# Patient Record
Sex: Male | Born: 1970
Health system: Southern US, Community
[De-identification: ages and names within clinical notes are randomized; demographics above are authoritative.]

## PROBLEM LIST (undated history)

## (undated) DIAGNOSIS — I10 Essential (primary) hypertension: Secondary | ICD-10-CM

## (undated) DIAGNOSIS — M069 Rheumatoid arthritis, unspecified: Secondary | ICD-10-CM

## (undated) DIAGNOSIS — J302 Other seasonal allergic rhinitis: Secondary | ICD-10-CM

## (undated) DIAGNOSIS — G4730 Sleep apnea, unspecified: Secondary | ICD-10-CM

## (undated) HISTORY — PX: WISDOM TOOTH EXTRACTION: SHX21

---

## 2005-02-14 ENCOUNTER — Ambulatory Visit (HOSPITAL_COMMUNITY): Admission: RE | Admit: 2005-02-14 | Discharge: 2005-02-14 | Payer: Self-pay | Admitting: Family Medicine

## 2010-07-25 ENCOUNTER — Other Ambulatory Visit (HOSPITAL_COMMUNITY)
Admission: RE | Admit: 2010-07-25 | Discharge: 2010-07-25 | Disposition: A | Discharge status: Home / Self Care | Payer: BC Managed Care – PPO | Source: Ambulatory Visit | Attending: Urology | Admitting: Urology

## 2010-07-25 ENCOUNTER — Other Ambulatory Visit: Payer: Self-pay | Admitting: Urology

## 2010-07-25 DIAGNOSIS — Z9852 Vasectomy status: Secondary | ICD-10-CM | POA: Insufficient documentation

## 2011-09-18 HISTORY — PX: OTHER SURGICAL HISTORY: SHX169

## 2011-09-18 HISTORY — PX: DOPPLER ECHOCARDIOGRAPHY: SHX263

## 2012-03-04 ENCOUNTER — Encounter (HOSPITAL_BASED_OUTPATIENT_CLINIC_OR_DEPARTMENT_OTHER): Payer: Self-pay | Admitting: *Deleted

## 2012-03-04 NOTE — Progress Notes (Signed)
Pt saw cardiology this spring for work up due to family hx only-called for notes Uses a cpap-to bring dos-also to bring overnight bag and meds in case anesth needs him to stay post op

## 2012-03-06 ENCOUNTER — Ambulatory Visit (HOSPITAL_BASED_OUTPATIENT_CLINIC_OR_DEPARTMENT_OTHER): Payer: BC Managed Care – PPO | Admitting: Anesthesiology

## 2012-03-06 ENCOUNTER — Ambulatory Visit (HOSPITAL_BASED_OUTPATIENT_CLINIC_OR_DEPARTMENT_OTHER)
Admission: RE | Admit: 2012-03-06 | Discharge: 2012-03-06 | Disposition: A | Discharge status: Home / Self Care | Payer: BC Managed Care – PPO | Source: Ambulatory Visit | Attending: Orthopedic Surgery | Admitting: Orthopedic Surgery

## 2012-03-06 ENCOUNTER — Encounter (HOSPITAL_BASED_OUTPATIENT_CLINIC_OR_DEPARTMENT_OTHER)
Admission: RE | Disposition: A | Discharge status: Home / Self Care | Payer: Self-pay | Source: Ambulatory Visit | Attending: Orthopedic Surgery

## 2012-03-06 ENCOUNTER — Encounter (HOSPITAL_BASED_OUTPATIENT_CLINIC_OR_DEPARTMENT_OTHER): Payer: Self-pay | Admitting: Anesthesiology

## 2012-03-06 ENCOUNTER — Encounter (HOSPITAL_BASED_OUTPATIENT_CLINIC_OR_DEPARTMENT_OTHER): Payer: Self-pay

## 2012-03-06 DIAGNOSIS — M659 Synovitis and tenosynovitis, unspecified: Secondary | ICD-10-CM

## 2012-03-06 DIAGNOSIS — G5760 Lesion of plantar nerve, unspecified lower limb: Secondary | ICD-10-CM | POA: Insufficient documentation

## 2012-03-06 DIAGNOSIS — G4730 Sleep apnea, unspecified: Secondary | ICD-10-CM | POA: Insufficient documentation

## 2012-03-06 DIAGNOSIS — I10 Essential (primary) hypertension: Secondary | ICD-10-CM | POA: Insufficient documentation

## 2012-03-06 DIAGNOSIS — M6580 Other synovitis and tenosynovitis, unspecified site: Secondary | ICD-10-CM | POA: Insufficient documentation

## 2012-03-06 HISTORY — PX: SYNOVECTOMY: SHX5180

## 2012-03-06 HISTORY — DX: Sleep apnea, unspecified: G47.30

## 2012-03-06 HISTORY — PX: EXCISION MORTON'S NEUROMA: SHX5013

## 2012-03-06 HISTORY — DX: Essential (primary) hypertension: I10

## 2012-03-06 HISTORY — DX: Other seasonal allergic rhinitis: J30.2

## 2012-03-06 LAB — POCT I-STAT, CHEM 8
BUN: 20 mg/dL (ref 6–23)
Creatinine, Ser: 1.1 mg/dL (ref 0.50–1.35)
HCT: 40 % (ref 39.0–52.0)
Hemoglobin: 13.6 g/dL (ref 13.0–17.0)
TCO2: 22 mmol/L (ref 0–100)

## 2012-03-06 SURGERY — EXCISION, MORTON'S NEUROMA
Anesthesia: General | Site: Foot | Laterality: Right | Wound class: Clean

## 2012-03-06 MED ORDER — MEPERIDINE HCL 25 MG/ML IJ SOLN: 6.2500 mg | INTRAMUSCULAR | Status: DC | PRN

## 2012-03-06 MED ORDER — FENTANYL CITRATE 0.05 MG/ML IJ SOLN
INTRAMUSCULAR | Status: DC | PRN
  Administered 2012-03-06: 25 ug via INTRAVENOUS
  Administered 2012-03-06: 100 ug via INTRAVENOUS
  Administered 2012-03-06: 25 ug via INTRAVENOUS

## 2012-03-06 MED ORDER — LACTATED RINGERS IV SOLN
INTRAVENOUS | Status: DC
  Administered 2012-03-06 (×3): via INTRAVENOUS

## 2012-03-06 MED ORDER — LIDOCAINE HCL (CARDIAC) 20 MG/ML IV SOLN
INTRAVENOUS | Status: DC | PRN
  Administered 2012-03-06: 50 mg via INTRAVENOUS

## 2012-03-06 MED ORDER — CEFAZOLIN SODIUM-DEXTROSE 2-3 GM-% IV SOLR
2.0000 g | INTRAVENOUS | Status: AC
  Administered 2012-03-06: 2 g via INTRAVENOUS

## 2012-03-06 MED ORDER — CHLORHEXIDINE GLUCONATE 4 % EX LIQD: 60.0000 mL | Freq: Once | CUTANEOUS | Status: DC

## 2012-03-06 MED ORDER — MIDAZOLAM HCL 2 MG/2ML IJ SOLN: 0.5000 mg | Freq: Once | INTRAMUSCULAR | Status: DC | PRN

## 2012-03-06 MED ORDER — BACITRACIN ZINC 500 UNIT/GM EX OINT
TOPICAL_OINTMENT | CUTANEOUS | Status: DC | PRN
  Administered 2012-03-06: 1 via TOPICAL

## 2012-03-06 MED ORDER — BUPIVACAINE HCL (PF) 0.5 % IJ SOLN
INTRAMUSCULAR | Status: DC | PRN
  Administered 2012-03-06: 5 mL

## 2012-03-06 MED ORDER — ONDANSETRON HCL 4 MG/2ML IJ SOLN
INTRAMUSCULAR | Status: DC | PRN
  Administered 2012-03-06: 4 mg via INTRAVENOUS

## 2012-03-06 MED ORDER — SODIUM CHLORIDE 0.9 % IV SOLN: INTRAVENOUS | Status: DC

## 2012-03-06 MED ORDER — HYDROCODONE-ACETAMINOPHEN 5-325 MG PO TABS: 1.0000 | ORAL_TABLET | Freq: Four times a day (QID) | ORAL | Status: DC | PRN

## 2012-03-06 MED ORDER — PROMETHAZINE HCL 25 MG/ML IJ SOLN: 6.2500 mg | INTRAMUSCULAR | Status: DC | PRN

## 2012-03-06 MED ORDER — MIDAZOLAM HCL 5 MG/5ML IJ SOLN
INTRAMUSCULAR | Status: DC | PRN
  Administered 2012-03-06: 2 mg via INTRAVENOUS

## 2012-03-06 MED ORDER — HYDROMORPHONE HCL PF 1 MG/ML IJ SOLN
0.2500 mg | INTRAMUSCULAR | Status: DC | PRN
  Administered 2012-03-06 (×2): 0.5 mg via INTRAVENOUS

## 2012-03-06 MED ORDER — DEXAMETHASONE SODIUM PHOSPHATE 4 MG/ML IJ SOLN
INTRAMUSCULAR | Status: DC | PRN
  Administered 2012-03-06: 10 mg via INTRAVENOUS

## 2012-03-06 MED ORDER — METOCLOPRAMIDE HCL 5 MG/ML IJ SOLN
INTRAMUSCULAR | Status: DC | PRN
  Administered 2012-03-06: 10 mg via INTRAVENOUS

## 2012-03-06 MED ORDER — PROPOFOL 10 MG/ML IV BOLUS
INTRAVENOUS | Status: DC | PRN
  Administered 2012-03-06: 200 mg via INTRAVENOUS

## 2012-03-06 SURGICAL SUPPLY — 56 items
BANDAGE CONFORM 3  STR LF (GAUZE/BANDAGES/DRESSINGS) ×2 IMPLANT
BANDAGE ESMARK 6X9 LF (GAUZE/BANDAGES/DRESSINGS) IMPLANT
BLADE SURG 15 STRL LF DISP TIS (BLADE) ×2 IMPLANT
BLADE SURG 15 STRL SS (BLADE) ×4
BNDG CMPR 9X4 STRL LF SNTH (GAUZE/BANDAGES/DRESSINGS) ×1
BNDG CMPR 9X6 STRL LF SNTH (GAUZE/BANDAGES/DRESSINGS)
BNDG COHESIVE 4X5 TAN STRL (GAUZE/BANDAGES/DRESSINGS) ×2 IMPLANT
BNDG ESMARK 4X9 LF (GAUZE/BANDAGES/DRESSINGS) ×2 IMPLANT
BNDG ESMARK 6X9 LF (GAUZE/BANDAGES/DRESSINGS)
CHLORAPREP W/TINT 26ML (MISCELLANEOUS) ×2 IMPLANT
CLOTH BEACON ORANGE TIMEOUT ST (SAFETY) ×2 IMPLANT
CORDS BIPOLAR (ELECTRODE) ×2 IMPLANT
COVER TABLE BACK 60X90 (DRAPES) ×2 IMPLANT
CUFF TOURNIQUET SINGLE 18IN (TOURNIQUET CUFF) IMPLANT
DRAPE EXTREMITY T 121X128X90 (DRAPE) ×2 IMPLANT
DRAPE SURG 17X23 STRL (DRAPES) IMPLANT
DRAPE U-SHAPE 47X51 STRL (DRAPES) ×2 IMPLANT
DRSG EMULSION OIL 3X3 NADH (GAUZE/BANDAGES/DRESSINGS) ×2 IMPLANT
DRSG PAD ABDOMINAL 8X10 ST (GAUZE/BANDAGES/DRESSINGS) IMPLANT
ELECT REM PT RETURN 9FT ADLT (ELECTROSURGICAL)
ELECTRODE REM PT RTRN 9FT ADLT (ELECTROSURGICAL) IMPLANT
GLOVE BIO SURGEON STRL SZ 6.5 (GLOVE) ×2 IMPLANT
GLOVE BIO SURGEON STRL SZ8 (GLOVE) ×2 IMPLANT
GLOVE BIOGEL PI IND STRL 7.0 (GLOVE) ×1 IMPLANT
GLOVE BIOGEL PI IND STRL 8 (GLOVE) ×1 IMPLANT
GLOVE BIOGEL PI INDICATOR 7.0 (GLOVE) ×1
GLOVE BIOGEL PI INDICATOR 8 (GLOVE) ×1
GOWN PREVENTION PLUS XLARGE (GOWN DISPOSABLE) ×2 IMPLANT
GOWN PREVENTION PLUS XXLARGE (GOWN DISPOSABLE) ×2 IMPLANT
NEEDLE HYPO 25X1 1.5 SAFETY (NEEDLE) ×2 IMPLANT
NS IRRIG 1000ML POUR BTL (IV SOLUTION) ×2 IMPLANT
PACK BASIN DAY SURGERY FS (CUSTOM PROCEDURE TRAY) ×2 IMPLANT
PAD CAST 4YDX4 CTTN HI CHSV (CAST SUPPLIES) ×1 IMPLANT
PADDING CAST ABS 4INX4YD NS (CAST SUPPLIES)
PADDING CAST ABS COTTON 4X4 ST (CAST SUPPLIES) IMPLANT
PADDING CAST COTTON 4X4 STRL (CAST SUPPLIES) ×2
SHEET MEDIUM DRAPE 40X70 STRL (DRAPES) ×2 IMPLANT
SPONGE GAUZE 4X4 12PLY (GAUZE/BANDAGES/DRESSINGS) ×2 IMPLANT
SPONGE LAP 18X18 X RAY DECT (DISPOSABLE) ×2 IMPLANT
STOCKINETTE 6  STRL (DRAPES) ×1
STOCKINETTE 6 STRL (DRAPES) ×1 IMPLANT
STRIP CLOSURE SKIN 1/2X4 (GAUZE/BANDAGES/DRESSINGS) IMPLANT
SUCTION FRAZIER TIP 10 FR DISP (SUCTIONS) IMPLANT
SUT MNCRL AB 3-0 PS2 18 (SUTURE) ×2 IMPLANT
SUT PROLENE 3 0 PS 2 (SUTURE) ×2 IMPLANT
SUT VIC AB 2-0 SH 18 (SUTURE) IMPLANT
SUT VIC AB 3-0 PS1 18 (SUTURE)
SUT VIC AB 3-0 PS1 18XBRD (SUTURE) IMPLANT
SUT VICRYL 4-0 PS2 18IN ABS (SUTURE) IMPLANT
SYR BULB 3OZ (MISCELLANEOUS) ×2 IMPLANT
SYR CONTROL 10ML LL (SYRINGE) ×2 IMPLANT
TOWEL OR 17X24 6PK STRL BLUE (TOWEL DISPOSABLE) ×2 IMPLANT
TUBE CONNECTING 20X1/4 (TUBING) IMPLANT
UNDERPAD 30X30 INCONTINENT (UNDERPADS AND DIAPERS) ×2 IMPLANT
WATER STERILE IRR 1000ML POUR (IV SOLUTION) IMPLANT
YANKAUER SUCT BULB TIP NO VENT (SUCTIONS) IMPLANT

## 2012-03-06 NOTE — Anesthesia Procedure Notes (Signed)
Procedure Name: LMA Insertion Date/Time: 03/06/2012 7:42 AM Performed by: Gar Gibbon Pre-anesthesia Checklist: Patient identified, Emergency Drugs available, Suction available and Patient being monitored Patient Re-evaluated:Patient Re-evaluated prior to inductionOxygen Delivery Method: Circle System Utilized Preoxygenation: Pre-oxygenation with 100% oxygen Intubation Type: IV induction Ventilation: Mask ventilation without difficulty LMA: LMA inserted LMA Size: 4.0 Number of attempts: 1 Airway Equipment and Method: bite block Placement Confirmation: positive ETCO2 Tube secured with: Tape Dental Injury: Teeth and Oropharynx as per pre-operative assessment

## 2012-03-06 NOTE — Anesthesia Preprocedure Evaluation (Signed)
Anesthesia Evaluation  Patient identified by MRN, date of birth, ID band Patient awake    Reviewed: Allergy & Precautions, H&P , NPO status , Patient's Chart, lab work & pertinent test results  History of Anesthesia Complications Negative for: history of anesthetic complications  Airway Mallampati: II TM Distance: >3 FB Neck ROM: Full    Dental  (+) Dental Advisory Given and Teeth Intact   Pulmonary sleep apnea and Continuous Positive Airway Pressure Ventilation ,  breath sounds clear to auscultation  Pulmonary exam normal       Cardiovascular hypertension, Pt. on medications Rhythm:Regular Rate:Normal  '13 ECHO: normal LVF, normal valves '13 exercise stress test: no ischemia   Neuro/Psych negative neurological ROS     GI/Hepatic negative GI ROS, Neg liver ROS,   Endo/Other  negative endocrine ROS  Renal/GU negative Renal ROS     Musculoskeletal   Abdominal   Peds  Hematology negative hematology ROS (+)   Anesthesia Other Findings   Reproductive/Obstetrics                           Anesthesia Physical Anesthesia Plan  ASA: II  Anesthesia Plan: General   Post-op Pain Management:    Induction: Intravenous  Airway Management Planned: LMA  Additional Equipment:   Intra-op Plan:   Post-operative Plan:   Informed Consent: I have reviewed the patients History and Physical, chart, labs and discussed the procedure including the risks, benefits and alternatives for the proposed anesthesia with the patient or authorized representative who has indicated his/her understanding and acceptance.   Dental advisory given  Plan Discussed with: CRNA and Surgeon  Anesthesia Plan Comments: (Plan routine monitors, GA- LMA OK Declines popliteal nerve block, Prefers local numbing)        Anesthesia Quick Evaluation

## 2012-03-06 NOTE — Anesthesia Postprocedure Evaluation (Signed)
  Anesthesia Post-op Note  Patient: Bobby Kramer  Procedure(s) Performed: Procedure(s) (LRB) with comments: EXCISION MORTON'S NEUROMA (Right) - EXCISION OF RIGHT THIRD WEB SPACE NEUROMA  HENDER RETRACTOR *ESMARK USED AS TOURNIQUET.  ON AT 1914.  OFF AT 0826. SYNOVECTOMY (Right) - SYNOVECTOMY OF THIRD AND FOURTH METATARSAL PHALANGEAL JOINT  Patient Location: PACU  Anesthesia Type: General  Level of Consciousness: awake, alert , oriented and patient cooperative  Airway and Oxygen Therapy: Patient Spontanous Breathing  Post-op Pain: none  Post-op Assessment: Post-op Vital signs reviewed, Patient's Cardiovascular Status Stable, Respiratory Function Stable, Patent Airway, No signs of Nausea or vomiting and Pain level controlled  Post-op Vital Signs: Reviewed and stable  Complications: No apparent anesthesia complications

## 2012-03-06 NOTE — Transfer of Care (Signed)
Immediate Anesthesia Transfer of Care Note  Patient: Bobby Kramer  Procedure(s) Performed: Procedure(s) (LRB) with comments: EXCISION MORTON'S NEUROMA (Right) - EXCISION OF RIGHT THIRD WEB SPACE NEUROMA  HENDER RETRACTOR *ESMARK USED AS TOURNIQUET.  ON AT 1610.  OFF AT 0826. SYNOVECTOMY (Right) - SYNOVECTOMY OF THIRD AND FOURTH METATARSAL PHALANGEAL JOINT  Patient Location: PACU  Anesthesia Type: General  Level of Consciousness: awake and patient cooperative  Airway & Oxygen Therapy: Patient Spontanous Breathing  Post-op Assessment: Report given to PACU RN  Post vital signs: Reviewed and stable  Complications: No apparent anesthesia complications

## 2012-03-06 NOTE — Op Note (Signed)
NAME:  LUNDY, COZART                 ACCOUNT NO.:  [id number]  MEDICAL RECORD NO.:  [id number]  LOCATION:                                 FACILITY:  PHYSICIAN:  Toni Arthurs, MD             DATE OF BIRTH:  DATE OF PROCEDURE:  03/06/2012 DATE OF DISCHARGE:                              OPERATIVE REPORT   PREOPERATIVE DIAGNOSES: 1. Right third space Morton's neuroma. 2. Right third metatarsophalangeal joint synovitis. 3. Right fourth metatarsophalangeal joint synovitis.  POSTOPERATIVE DIAGNOSES: 1. Right third space Morton's neuroma. 2. Right third metatarsophalangeal joint synovitis. 3. Right fourth metatarsophalangeal joint synovitis.  PROCEDURE: 1. Excision of right third webspace Morton's neuroma. 2. Synovectomy, right third MTP joint. 3. Synovectomy, right fourth MTP joint.  SURGEON:  Toni Arthurs, MD  ANESTHESIA:  General.  ESTIMATED BLOOD LOSS:  Minimal.  TOURNIQUET TIME:  38 minutes with an ankle Esmarch.  COMPLICATIONS:  None apparent.  SPECIMENS:  Synovium right fourth MTP joint to Pathology.  DISPOSITION:  Extubated awake and stable to recovery.  INDICATIONS FOR PROCEDURE:  The patient is a 41 year old male with history of right forefoot pain now for several months.  Symptoms and signs are consistent with a Morton's neuroma as well as synovitis of the third and fourth MTP joints.  He presents now for excision of his Morton's neuroma as well as third and fourth MTP joint synovectomies. He understands the risks and benefits, the alternative treatment options, and elects surgical treatment.  He specifically understands risks of bleeding, infection, nerve damage, blood clots, need for additional surgery, amputation, and death.  PROCEDURE IN DETAIL:  After preoperative consent was obtained, the correct operative site was identified.  The patient was brought to the operating room and placed supine on the operating table.  General anesthesia was induced.   Preoperative antibiotics were administered. Surgical time-out was taken.  The right lower extremity was prepped and draped in standard sterile fashion.  Extremity was exsanguinated and tourniquet was wrapped around the ankle.  A longitudinal incision was made over the third web space.  Sharp dissection was carried down through the skin and subcutaneous tissue.  Blunt dissection was then carried down to the interspace between the third and fourth metatarsals. The intermetatarsal ligament was identified.  It was divided under direct vision in its entirety.  The interdigital nerve was identified and was noted to have a stenosed hourglass appearance with quite a bit of thickening over the distal portion.  The nerve was sharply transected proximally at the level of the interossei and allowed to retract within the muscle layer.  Distally the nerve was traced out to its bifurcation and was transected on each side of the bifurcation.  The artery was inspected and was noted to be healthy and intact.  Attention was then turned to the third MTP joint.  The interval between the extensor digitorum brevis and extensor digitorum longus was identified and incised.  The dorsal capsulotomy was made and the dorsal joint capsule was excised in its entirety.  It was noted to be quite thickened with mild amount of synovitis.  The medial, lateral, and plantar aspect  of the joint were inspected and were noted to have no significant synovitis.  Attention was then turned to the fourth MTP joint.  The interval just medial to the extensor digitorum longus tendon was identified and this was incised.  There was significant synovitis noted at the dorsum of the joint but the joint itself was stable to Lachman testing.  The synovitis dorsally was resected and this extended laterally and medially. Plantarly the joint had no synovitis.  The specimen of synovium was sent to Pathology.  The wound was irrigated copiously  including the webspace and both MTP joints.  Inverted simple sutures of 3-0 Monocryl were used to close the subcutaneous tissue and horizontal mattress sutures of 3-0 Prolene were used to close the skin incision.  Marcaine 0.5% plain was infiltrated into the subcutaneous tissue at the site of the incision. Sterile dressings were applied followed by a compression wrap.  The patient was then awakened from anesthesia and transported to the recovery room in stable condition.  The tourniquet had been released at 38 minutes after application of the dressings.  FOLLOWUP PLAN:  The patient will be weightbearing as tolerated on his right lower extremity in a hard-sole shoe.  He will follow up with me in 2 weeks for suture removal.     Toni Arthurs, MD     JH/MEDQ  D:  03/06/2012  T:  03/06/2012  Job:  [id number]

## 2012-03-06 NOTE — H&P (Signed)
Bobby Kramer is an 41 y.o. male.   Chief Complaint: right foot pain HPI: 41 y/o male with h/o right forefoot pain for many months.  He has had a favorable response to a steroid injection in the 3rd webspace, but the response wore off.  He has failed treatment with orthotics, activity modification, nsaids and steroid injection.  He presents now for neurolysis v. Neurectomy of the 3rd webspace and synovectomy of the 3rd and 4th MTP joints of the right foot.  Past Medical History  Diagnosis Date  . Hypertension   . Seasonal allergies   . Sleep apnea     uses a cpap  . Morton neuroma     Past Surgical History  Procedure Date  . Wisdom tooth extraction     History reviewed. No pertinent family history. Social History:  reports that he quit smoking about 5 years ago. He does not have any smokeless tobacco history on file. He reports that he drinks alcohol. He reports that he does not use illicit drugs.  Allergies: No Known Allergies  Medications Prior to Admission  Medication Sig Dispense Refill  . fluticasone (FLONASE) 50 MCG/ACT nasal spray Place 2 sprays into the nose daily.      Marland Kitchen losartan (COZAAR) 100 MG tablet Take 100 mg by mouth daily.        Results for orders placed during the hospital encounter of Mar 24, 2012 (from the past 48 hour(s))  POCT I-STAT, CHEM 8     Status: Abnormal   Collection Time   03-24-2012  6:59 AM      Component Value Range Comment   Sodium 140  135 - 145 mEq/L    Potassium 3.9  3.5 - 5.1 mEq/L    Chloride 107  96 - 112 mEq/L    BUN 20  6 - 23 mg/dL    Creatinine, Ser 1.61  0.50 - 1.35 mg/dL    Glucose, Bld 096 (*) 70 - 99 mg/dL    Calcium, Ion 0.45  4.09 - 1.23 mmol/L    TCO2 22  0 - 100 mmol/L    Hemoglobin 13.6  13.0 - 17.0 g/dL    HCT 81.1  91.4 - 78.2 %    No results found.  ROS  No recent f/c/n/v/wt loss.  Blood pressure 122/79, pulse 62, temperature 98 F (36.7 C), temperature source Oral, resp. rate 20, height 6\' 2"  (1.88 m), weight  104.327 kg (230 lb), SpO2 98.00%. Physical Exam  wn wd male in nad.  A and O x 4.  Mood and affect normal.  EOMI.  Respirations unlabored.  R foot with healthy and intact skin.  No lymphadenopathy.  2+ dp and pt pulses.  Feels LT normally throughout foot.  5/5 strength in PF and DF at toes.  TTP at 3rd webspace and pain with shuck testing of the 3rd and 4th MTP joints.  Assessment/Plan Right forefoot 3rd webspace morton's neuroma and 3rd and 4th MTP joint synovitis.  To OR for neurolysis v. Neurectomy of the 3rd webspace and synovectomy of the 3rd and 4th mtp joints. The risks and benefits of the alternative treatment options have been discussed in detail.  The patient wishes to proceed with surgery and specifically understands risks of bleeding, infection, nerve damage, blood clots, need for additional surgery, amputation and death.   AUSTAN, NICHOLL 03/24/2012, 7:25 AM

## 2012-03-06 NOTE — Brief Op Note (Signed)
03/06/2012  8:36 AM  PATIENT:  Bobby Kramer  41 y.o. male  PRE-OPERATIVE DIAGNOSIS:  RIGHT THIRD WEB SPACE NEUROMA AND THIRD AND FOURTH METATARSAL PHALANGEAL JOINT SYNOVITIS  POST-OPERATIVE DIAGNOSIS:  RIGHT THIRD WEB SPACE NEUROMA AND THIRD AND FOURTH METATARSAL PHALANGEAL JOINT SYNOVITIS  Procedure(s): 1.  Excision 3rd webspace morton's neuroma 2.  Synovectomy 3rd MTP joint  3.  Synovectomy 4th MTP joint  SURGEON:  Toni Arthurs, MD  ASSISTANT: n/a  ANESTHESIA:   General  EBL:  minimal   TOURNIQUET:  38 min with ankle esmarch  COMPLICATIONS:  None apparent  DISPOSITION:  Extubated, awake and stable to recovery.  DICTATION ID:  [id number]

## 2012-03-07 ENCOUNTER — Encounter (HOSPITAL_BASED_OUTPATIENT_CLINIC_OR_DEPARTMENT_OTHER): Payer: Self-pay | Admitting: Orthopedic Surgery

## 2012-05-09 HISTORY — PX: CARDIAC CATHETERIZATION: SHX172

## 2012-05-14 ENCOUNTER — Ambulatory Visit (HOSPITAL_COMMUNITY)
Admission: RE | Admit: 2012-05-14 | Discharge: 2012-05-14 | Disposition: A | Discharge status: Home / Self Care | Payer: BC Managed Care – PPO | Source: Ambulatory Visit | Attending: Cardiovascular Disease | Admitting: Cardiovascular Disease

## 2012-05-14 ENCOUNTER — Other Ambulatory Visit (HOSPITAL_COMMUNITY): Payer: Self-pay | Admitting: Cardiovascular Disease

## 2012-05-14 DIAGNOSIS — Z01811 Encounter for preprocedural respiratory examination: Secondary | ICD-10-CM

## 2012-05-14 DIAGNOSIS — Z87891 Personal history of nicotine dependence: Secondary | ICD-10-CM | POA: Insufficient documentation

## 2012-05-14 DIAGNOSIS — R079 Chest pain, unspecified: Secondary | ICD-10-CM | POA: Insufficient documentation

## 2012-05-15 ENCOUNTER — Encounter (HOSPITAL_COMMUNITY): Payer: Self-pay | Admitting: Pharmacy Technician

## 2012-05-26 ENCOUNTER — Ambulatory Visit (HOSPITAL_COMMUNITY)
Admission: RE | Admit: 2012-05-26 | Discharge: 2012-05-26 | Disposition: A | Discharge status: Home / Self Care | Payer: BC Managed Care – PPO | Source: Ambulatory Visit | Attending: Cardiovascular Disease | Admitting: Cardiovascular Disease

## 2012-05-26 ENCOUNTER — Encounter (HOSPITAL_COMMUNITY)
Admission: RE | Disposition: A | Discharge status: Home / Self Care | Payer: Self-pay | Source: Ambulatory Visit | Attending: Cardiovascular Disease

## 2012-05-26 DIAGNOSIS — R0789 Other chest pain: Secondary | ICD-10-CM | POA: Insufficient documentation

## 2012-05-26 HISTORY — PX: LEFT HEART CATHETERIZATION WITH CORONARY ANGIOGRAM: SHX5451

## 2012-05-26 SURGERY — LEFT HEART CATHETERIZATION WITH CORONARY ANGIOGRAM
Anesthesia: LOCAL | Laterality: Bilateral

## 2012-05-26 MED ORDER — ACETAMINOPHEN 325 MG PO TABS: 650.0000 mg | ORAL_TABLET | ORAL | Status: DC | PRN

## 2012-05-26 MED ORDER — SODIUM CHLORIDE 0.9 % IV SOLN: 250.0000 mL | INTRAVENOUS | Status: DC | PRN

## 2012-05-26 MED ORDER — FENTANYL CITRATE 0.05 MG/ML IJ SOLN
INTRAMUSCULAR | Status: AC
  Filled 2012-05-26: qty 2

## 2012-05-26 MED ORDER — SODIUM CHLORIDE 0.9 % IJ SOLN: 3.0000 mL | INTRAMUSCULAR | Status: DC | PRN

## 2012-05-26 MED ORDER — ASPIRIN 81 MG PO CHEW
324.0000 mg | CHEWABLE_TABLET | ORAL | Status: AC
  Administered 2012-05-26: 324 mg via ORAL

## 2012-05-26 MED ORDER — HEPARIN (PORCINE) IN NACL 2-0.9 UNIT/ML-% IJ SOLN
INTRAMUSCULAR | Status: AC
  Filled 2012-05-26: qty 1000

## 2012-05-26 MED ORDER — MIDAZOLAM HCL 2 MG/2ML IJ SOLN
INTRAMUSCULAR | Status: AC
  Filled 2012-05-26: qty 2

## 2012-05-26 MED ORDER — HEPARIN SODIUM (PORCINE) 1000 UNIT/ML IJ SOLN
INTRAMUSCULAR | Status: AC
  Filled 2012-05-26: qty 1

## 2012-05-26 MED ORDER — SODIUM CHLORIDE 0.9 % IJ SOLN: 3.0000 mL | Freq: Two times a day (BID) | INTRAMUSCULAR | Status: DC

## 2012-05-26 MED ORDER — SODIUM CHLORIDE 0.9 % IV SOLN: 1.0000 mL/kg/h | INTRAVENOUS | Status: DC

## 2012-05-26 MED ORDER — NITROGLYCERIN 0.2 MG/ML ON CALL CATH LAB
INTRAVENOUS | Status: AC
  Filled 2012-05-26: qty 1

## 2012-05-26 MED ORDER — LIDOCAINE HCL (PF) 1 % IJ SOLN
INTRAMUSCULAR | Status: AC
  Filled 2012-05-26: qty 30

## 2012-05-26 MED ORDER — VERAPAMIL HCL 2.5 MG/ML IV SOLN
INTRAVENOUS | Status: AC
  Filled 2012-05-26: qty 2

## 2012-05-26 MED ORDER — ASPIRIN 81 MG PO CHEW
CHEWABLE_TABLET | ORAL | Status: AC
  Filled 2012-05-26: qty 4

## 2012-05-26 MED ORDER — SODIUM CHLORIDE 0.9 % IV SOLN
INTRAVENOUS | Status: DC
  Administered 2012-05-26: 11:00:00 via INTRAVENOUS

## 2012-05-26 MED ORDER — ONDANSETRON HCL 4 MG/2ML IJ SOLN: 4.0000 mg | Freq: Four times a day (QID) | INTRAMUSCULAR | Status: DC | PRN

## 2012-05-26 NOTE — CV Procedure (Signed)
Bobby Kramer,Bobby Kramer Male, 41 y.o., 1970-10-16  Location: [facility]  Bed: NONE  MRN: [id number]  CSN: [id number]  Admit Dt: 05/26/12  CARDIAC CATHETERIZATION REPORT   Procedures performed:  1. Left heart catheterization  2. Selective coronary angiography  3. Left ventriculography   Reason for procedure:  Unexplained atypical chest pain  Procedure performed by: Thurmon Fair, MD, [facility]  Complications: none   Estimated blood loss: less than 5 mL   History:  41 year old with recurrent complaints of unexplained chest pain and risk factors for CAD.  Consent: The risks, benefits, and details of the procedure were explained to the patient. Risks including death, MI, stroke, bleeding, limb ischemia, renal failure and allergy were described and accepted by the patient. Informed written consent was obtained prior to proceeding.  Technique: The patient was brought to the cardiac catheterization laboratory in the fasting state. He was prepped and draped in the usual sterile fashion. Local anesthesia with 1% lidocaine was administered to the right wrist area. Using the modified Seldinger technique a 5 French right radial artery sheath was introduced without difficulty. Under fluoroscopic guidance, using 5 French TIG and angled pigtail catheters, selective cannulation of the left coronary artery, right coronary artery and left ventricle were respectively performed. Several coronary angiograms in a variety of projections were recorded, as well as a left ventriculogram in the RAO projection. Left ventricular pressure and a pull back to the aorta were recorded. No immediate complications occurred. At the end of the procedure, all catheters were removed. After the procedure, hemostasis will be achieved with manual pressure.  Contrast used: 70 mL Omnipaque  Angiographic Findings:  1. The left main coronary artery is free of significant atherosclerosis and trifurcates in the usual fashion into the left anterior  descending artery, a large ramus intermedius and the left circumflex coronary artery.  2. The left anterior descending artery is a large vessel that reaches the apex and generates one major diagonal branch. There is evidence of minimal luminal irregularities and no calcification. No hemodynamically meaningful stenoses are seen. 3. The left circumflex coronary artery is a medium-size vessel non dominant vessel that generates two major oblique marginal arteries, the second being larger. There is evidence of no luminal irregularities and no calcification. No hemodynamically meaningful stenoses are seen. 4. The ramus intermedius artery is very large and supplies most of the lateral wall. It is free of visible atherosclerosis. 5. The right coronary artery is a large-size dominant vessel that generates a long posterior lateral ventricular system as well as the PDA. There is evidence of no luminal irregularities and no calcification. No hemodynamically meaningful stenoses are seen.  5. The left ventricle is normal in size. The left ventricle systolic function is normal with an estimated ejection fraction of 55%. Regional wall motion abnormalities are not seen. No left ventricular thrombus is seen. There is no mitral insufficiency. The ascending aorta appears normal. There is no aortic valve stenosis by pullback. The left ventricular end-diastolic pressure is 14 mm Hg.    IMPRESSIONS:  Angiographically normal coronary arteries.  RECOMMENDATION:  Evaluate for a non cardiac cause of pain.     Thurmon Fair, MD, [facility] [phone] office [phone] pager 05/26/2012 12:16 PM  Cc: Katharine Look, MD

## 2012-05-26 NOTE — H&P (Signed)
Date of Initial H&P: 05/14/2012  History reviewed, patient examined, no change in status, stable for surgery. Persistent chest pain, sometimes exertion related, here for definitive diagnostic coronary angio. This procedure has been fully reviewed with the patient and written informed consent has been obtained. Thurmon Fair, MD, [facility] [phone] office [phone] pager

## 2012-05-26 NOTE — Progress Notes (Signed)
TRB removed, 2x2 applied with tegaderm CDI site unremarkable will continue to monitor

## 2013-07-28 ENCOUNTER — Ambulatory Visit (INDEPENDENT_AMBULATORY_CARE_PROVIDER_SITE_OTHER): Payer: BC Managed Care – PPO | Admitting: Cardiovascular Disease

## 2013-07-28 ENCOUNTER — Encounter: Payer: Self-pay | Admitting: Cardiovascular Disease

## 2013-07-28 ENCOUNTER — Other Ambulatory Visit: Payer: Self-pay | Admitting: Cardiovascular Disease

## 2013-07-28 VITALS — BP 140/100 | HR 63 | Ht 74.0 in | Wt 235.5 lb

## 2013-07-28 DIAGNOSIS — E669 Obesity, unspecified: Secondary | ICD-10-CM

## 2013-07-28 DIAGNOSIS — I10 Essential (primary) hypertension: Secondary | ICD-10-CM

## 2013-07-28 MED ORDER — OLMESARTAN-AMLODIPINE-HCTZ 20-5-12.5 MG PO TABS: 1.0000 | ORAL_TABLET | Freq: Every day | ORAL | Status: DC

## 2013-07-28 NOTE — Patient Instructions (Signed)
Your physician recommends that you schedule a follow-up appointment in: One year with Dr. Croitoru.  
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
## 2013-11-13 ENCOUNTER — Ambulatory Visit (HOSPITAL_COMMUNITY)
Admission: RE | Admit: 2013-11-13 | Discharge: 2013-11-13 | Disposition: A | Discharge status: Home / Self Care | Payer: BC Managed Care – PPO | Source: Ambulatory Visit | Attending: Family Medicine | Admitting: Family Medicine

## 2013-11-13 DIAGNOSIS — I10 Essential (primary) hypertension: Secondary | ICD-10-CM | POA: Insufficient documentation

## 2013-11-13 DIAGNOSIS — M6281 Muscle weakness (generalized): Secondary | ICD-10-CM | POA: Insufficient documentation

## 2013-11-13 DIAGNOSIS — M542 Cervicalgia: Secondary | ICD-10-CM | POA: Insufficient documentation

## 2013-11-13 DIAGNOSIS — E669 Obesity, unspecified: Secondary | ICD-10-CM | POA: Insufficient documentation

## 2013-11-13 DIAGNOSIS — IMO0001 Reserved for inherently not codable concepts without codable children: Secondary | ICD-10-CM | POA: Insufficient documentation

## 2013-11-13 DIAGNOSIS — S139XXA Sprain of joints and ligaments of unspecified parts of neck, initial encounter: Secondary | ICD-10-CM | POA: Insufficient documentation

## 2013-11-13 DIAGNOSIS — R51 Headache: Secondary | ICD-10-CM | POA: Insufficient documentation
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
## 2013-11-16 ENCOUNTER — Ambulatory Visit (HOSPITAL_COMMUNITY)
Admission: RE | Admit: 2013-11-16 | Discharge: 2013-11-16 | Disposition: A | Discharge status: Home / Self Care | Payer: BC Managed Care – PPO | Source: Ambulatory Visit | Attending: Family Medicine | Admitting: Family Medicine
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
## 2013-11-18 ENCOUNTER — Ambulatory Visit (HOSPITAL_COMMUNITY): Payer: BC Managed Care – PPO | Admitting: Physical Therapy

## 2013-11-30 ENCOUNTER — Ambulatory Visit (HOSPITAL_COMMUNITY)
Admission: RE | Admit: 2013-11-30 | Discharge: 2013-11-30 | Disposition: A | Discharge status: Home / Self Care | Payer: BC Managed Care – PPO | Source: Ambulatory Visit | Attending: Family Medicine | Admitting: Family Medicine

## 2013-11-30 DIAGNOSIS — IMO0001 Reserved for inherently not codable concepts without codable children: Secondary | ICD-10-CM | POA: Insufficient documentation

## 2013-11-30 DIAGNOSIS — I10 Essential (primary) hypertension: Secondary | ICD-10-CM | POA: Insufficient documentation

## 2013-11-30 DIAGNOSIS — M6281 Muscle weakness (generalized): Secondary | ICD-10-CM | POA: Insufficient documentation

## 2013-11-30 DIAGNOSIS — E669 Obesity, unspecified: Secondary | ICD-10-CM | POA: Diagnosis not present

## 2013-11-30 DIAGNOSIS — M542 Cervicalgia: Secondary | ICD-10-CM | POA: Diagnosis not present

## 2013-11-30 DIAGNOSIS — R51 Headache: Secondary | ICD-10-CM | POA: Insufficient documentation
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
## 2013-12-02 ENCOUNTER — Ambulatory Visit (HOSPITAL_COMMUNITY)
Admission: RE | Admit: 2013-12-02 | Discharge: 2013-12-02 | Disposition: A | Discharge status: Home / Self Care | Payer: BC Managed Care – PPO | Source: Ambulatory Visit | Attending: Family Medicine | Admitting: Family Medicine

## 2013-12-02 DIAGNOSIS — IMO0001 Reserved for inherently not codable concepts without codable children: Secondary | ICD-10-CM | POA: Diagnosis not present
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
## 2013-12-08 ENCOUNTER — Ambulatory Visit (HOSPITAL_COMMUNITY): Payer: BC Managed Care – PPO | Admitting: Physical Therapy

## 2014-01-12 NOTE — Progress Notes (Signed)
Discharge Patient Details  Name: Bobby Kramer MRN: [id number] Date of Birth: 27-Aug-1970  Today's Date: 01/12/2014  Patient discharged following not returning to therapy after 4 visits, last visit 12/02/13  [name] R 01/12/2014, 9:21 AM

## 2014-01-12 NOTE — Addendum Note (Signed)
Encounter addended by: Doyne Keel, PT on: 01/12/2014  9:23 AM<BR>     Documentation filed: Episodes, Letters, Notes Section

## 2014-05-06 ENCOUNTER — Encounter (HOSPITAL_COMMUNITY): Payer: Self-pay | Admitting: Cardiovascular Disease

## 2014-08-13 ENCOUNTER — Ambulatory Visit (INDEPENDENT_AMBULATORY_CARE_PROVIDER_SITE_OTHER): Payer: BLUE CROSS/BLUE SHIELD | Admitting: Cardiovascular Disease

## 2014-08-13 ENCOUNTER — Encounter: Payer: Self-pay | Admitting: Cardiovascular Disease

## 2014-08-13 VITALS — BP 130/80 | HR 80 | Resp 16 | Ht 74.0 in | Wt 231.2 lb

## 2014-08-13 DIAGNOSIS — I10 Essential (primary) hypertension: Secondary | ICD-10-CM

## 2014-08-13 MED ORDER — OLMESARTAN-AMLODIPINE-HCTZ 20-5-12.5 MG PO TABS: 1.0000 | ORAL_TABLET | Freq: Every day | ORAL | Status: DC

## 2014-08-13 NOTE — Patient Instructions (Signed)
Dr. Croitoru recommends that you schedule a follow-up appointment in: One year.   

## 2014-08-15 ENCOUNTER — Encounter: Payer: Self-pay | Admitting: Cardiovascular Disease

## 2014-08-15 NOTE — Progress Notes (Addendum)
Patient ID: Bobby Kramer, male   DOB: 10-04-1970, 44 y.o.   MRN: [id number]     Cardiology Office Note   Date:  08/15/2014   ID:  Bobby Kramer, DOB [birth year], MRN [id number]  PCP:  Milana Obey, MD  Cardiologist:   Thurmon Fair, MD   Chief Complaint  Patient presents with  . Follow-up    Yearly:  No complaints of chest pain, SOB, edema or dizziness.      History of Present Illness: Bobby Kramer is a 44 y.o. male who presents for follow up of abnormal ECG and HTN. BP control is good . He has been travelling a lot due to his job and has exercised less. He is borderline obese again.  He denies any cardiac complaints. BP is uniformly well controlled (on three agent combo).   Past Medical History  Diagnosis Date  . Hypertension   . Seasonal allergies   . Sleep apnea     uses a cpap  . Morton neuroma     Past Surgical History  Procedure Laterality Date  . Wisdom tooth extraction    . Excision morton's neuroma  03/06/2012    Procedure: EXCISION MORTON'S NEUROMA;  Surgeon: Toni Arthurs, MD;  Location: [facility];  Service: Orthopedics;  Laterality: Right;  EXCISION OF RIGHT THIRD WEB SPACE NEUROMA  HENDER RETRACTOR *ESMARK USED AS TOURNIQUET.  ON AT 6767.  OFF AT 0826.  Marland Kitchen Synovectomy  03/06/2012    Procedure: SYNOVECTOMY;  Surgeon: Toni Arthurs, MD;  Location: [facility];  Service: Orthopedics;  Laterality: Right;  SYNOVECTOMY OF THIRD AND FOURTH METATARSAL PHALANGEAL JOINT  . Left heart catheterization with coronary angiogram Bilateral 05/26/2012    Procedure: LEFT HEART CATHETERIZATION WITH CORONARY ANGIOGRAM;  Surgeon: Thurmon Fair, MD;  Location: [facility];  Service: Cardiovascular;  Laterality: Bilateral;  . Doppler echocardiography  09/18/2011    EF>55%  . Gxt  09/18/2011  . Cardiac catheterization  05/09/2012    EF 55% Normal Coronary Arteries     Current Outpatient Prescriptions  Medication Sig Dispense Refill  . aspirin 81  MG tablet Take 81 mg by mouth daily.    . fluticasone (FLONASE) 50 MCG/ACT nasal spray Place 2 sprays into the nose daily as needed. For seasonal allergies    . Olmesartan-Amlodipine-HCTZ (TRIBENZOR) 20-5-12.5 MG TABS Take 1 tablet by mouth daily. 90 tablet 3   No current facility-administered medications for this visit.    Allergies:   Review of patient's allergies indicates no known allergies.    Social History:  The patient  reports that he quit smoking about 7 years ago. He does not have any smokeless tobacco history on file. He reports that he drinks alcohol. He reports that he does not use illicit drugs.   Family History:  Significant for HTN   ROS:  Please see the history of present illness.     The patient specifically denies any chest pain at rest or with exertion, dyspnea at rest or with exertion, orthopnea, paroxysmal nocturnal dyspnea, syncope, palpitations, focal neurological deficits, intermittent claudication, lower extremity edema, unexplained weight gain, cough, hemoptysis or wheezing.  The patient also denies abdominal pain, nausea, vomiting, dysphagia, diarrhea, constipation, polyuria, polydipsia, dysuria, hematuria, frequency, urgency, abnormal bleeding or bruising, fever, chills, unexpected weight changes, mood swings, change in skin or hair texture, change in voice quality, auditory or visual problems, allergic reactions or rashes, new musculoskeletal complaints other than usual "aches and pains".  All other systems  are reviewed and negative.    PHYSICAL EXAM: VS:  BP 130/80 mmHg  Pulse 80  Resp 16  Ht 6\' 2"  (1.88 m)  Wt 231 lb 3.2 oz (104.872 kg)  BMI 29.67 kg/m2 , BMI Body mass index is 29.67 kg/(m^2).  General: Alert, oriented x3, no distress Head: no evidence of trauma, PERRL, EOMI, no exophtalmos or lid lag, no myxedema, no xanthelasma; normal ears, nose and oropharynx Neck: normal jugular venous pulsations and no hepatojugular reflux; brisk carotid pulses  without delay and no carotid bruits Chest: clear to auscultation, no signs of consolidation by percussion or palpation, normal fremitus, symmetrical and full respiratory excursions Cardiovascular: normal position and quality of the apical impulse, regular rhythm, normal first and second heart sounds, no murmurs, rubs or gallops Abdomen: no tenderness or distention, no masses by palpation, no abnormal pulsatility or arterial bruits, normal bowel sounds, no hepatosplenomegaly Extremities: no clubbing, cyanosis or edema; 2+ radial, ulnar and brachial pulses bilaterally; 2+ right femoral, posterior tibial and dorsalis pedis pulses; 2+ left femoral, posterior tibial and dorsalis pedis pulses; no subclavian or femoral bruits Neurological: grossly nonfocal Psych: euthymic mood, full affect   EKG:  EKG is ordered today. The ekg ordered today demonstrates NSR, chronic nonspecific inferolateral T wave changes   Recent Labs: No results found for requested labs within last 365 days.    Lipid Panel 08/03/2014 TC 215, TG 148, LDL 142, HDL 43, non HDL 172 02/23/2014 TC 209, TG 223, LDL 122, HDL42, non HDL 167    Wt Readings from Last 3 Encounters:  08/13/14 231 lb 3.2 oz (104.872 kg)  07/28/13 235 lb 8 oz (106.822 kg)  05/26/12 235 lb (106.595 kg)       ASSESSMENT AND PLAN:  Had a long discussion regarding the importance of weight control via routine exercise and dietary changes.  Lipids have pattern consistent with metabolic sd./ insulin resistance/ small dense LDL. Exercise, judicious alcohol consumption, low saturated fat-low carb with high glycemic index diet with increased unsaturated fat, protein and complex carbs with low glycemic index reviewed. Close to pharmacological indication for statin unless he implements lifestyle changes.  Current medicines are reviewed at length with the patient today.  The patient does not have concerns regarding medicines.  The following changes have been  made:  no change  Labs/ tests ordered today include:  Orders Placed This Encounter  Procedures  . EKG 12-Lead    Patient Instructions  Dr. 05/28/12 recommends that you schedule a follow-up appointment in: One year.      Royann Shivers, MD  08/15/2014 7:03 PM    08/17/2014, MD, [facility] [phone] office [phone] pager

## 2014-08-19 ENCOUNTER — Encounter: Payer: Self-pay | Admitting: Cardiovascular Disease

## 2014-11-17 ENCOUNTER — Other Ambulatory Visit (HOSPITAL_COMMUNITY): Payer: Self-pay | Admitting: Rheumatology

## 2014-11-17 ENCOUNTER — Ambulatory Visit (HOSPITAL_COMMUNITY)
Admission: RE | Admit: 2014-11-17 | Discharge: 2014-11-17 | Disposition: A | Discharge status: Home / Self Care | Payer: BLUE CROSS/BLUE SHIELD | Source: Ambulatory Visit | Attending: Rheumatology | Admitting: Rheumatology

## 2014-11-17 DIAGNOSIS — Z9225 Personal history of immunosupression therapy: Secondary | ICD-10-CM | POA: Diagnosis present

## 2015-01-19 ENCOUNTER — Telehealth: Payer: Self-pay | Admitting: Cardiovascular Disease

## 2015-01-19 MED ORDER — OLMESARTAN-AMLODIPINE-HCTZ 20-5-12.5 MG PO TABS: 1.0000 | ORAL_TABLET | Freq: Every day | ORAL | Status: DC

## 2015-01-19 NOTE — Telephone Encounter (Signed)
Rx(s) sent to pharmacy electronically. Patient notified. 

## 2015-01-19 NOTE — Telephone Encounter (Signed)
°  1. Which medications need to be refilled? Tribenzor   2. Which pharmacy is medication to be sent to? [facility]   3. Do they need a 30 day or 90 day supply? 90  4. Would they like a call back once the medication has been sent to the pharmacy? Yes

## 2015-04-18 ENCOUNTER — Other Ambulatory Visit: Payer: Self-pay | Admitting: *Deleted

## 2015-04-18 MED ORDER — OLMESARTAN-AMLODIPINE-HCTZ 20-5-12.5 MG PO TABS: 1.0000 | ORAL_TABLET | Freq: Every day | ORAL | Status: DC

## 2015-05-12 ENCOUNTER — Other Ambulatory Visit: Payer: Self-pay | Admitting: Pharmacist Clinician (PhC)/ Clinical Pharmacy Specialist

## 2015-09-13 DIAGNOSIS — M79671 Pain in right foot: Secondary | ICD-10-CM | POA: Diagnosis not present

## 2015-09-13 DIAGNOSIS — E559 Vitamin D deficiency, unspecified: Secondary | ICD-10-CM | POA: Diagnosis not present

## 2015-09-13 DIAGNOSIS — Z09 Encounter for follow-up examination after completed treatment for conditions other than malignant neoplasm: Secondary | ICD-10-CM | POA: Diagnosis not present

## 2015-09-13 DIAGNOSIS — M0579 Rheumatoid arthritis with rheumatoid factor of multiple sites without organ or systems involvement: Secondary | ICD-10-CM | POA: Diagnosis not present

## 2015-10-13 DIAGNOSIS — Z79899 Other long term (current) drug therapy: Secondary | ICD-10-CM | POA: Diagnosis not present

## 2015-10-13 DIAGNOSIS — E559 Vitamin D deficiency, unspecified: Secondary | ICD-10-CM | POA: Diagnosis not present

## 2015-10-20 DIAGNOSIS — E559 Vitamin D deficiency, unspecified: Secondary | ICD-10-CM | POA: Diagnosis not present

## 2015-10-20 DIAGNOSIS — Z79899 Other long term (current) drug therapy: Secondary | ICD-10-CM | POA: Diagnosis not present

## 2015-11-03 ENCOUNTER — Telehealth: Payer: Self-pay | Admitting: Cardiovascular Disease

## 2015-11-03 NOTE — Telephone Encounter (Signed)
Change mail order pharmacy to [facility] patient to see if refill is needed ,had to leave a message to return call

## 2015-11-03 NOTE — Telephone Encounter (Signed)
New Message-   Pt called to sched med refill appt w/ Dr C- 01/06/16- pt wanted to inform- his pharm changed from [facility] to [facility]. Please advise.

## 2015-11-11 ENCOUNTER — Other Ambulatory Visit: Payer: Self-pay | Admitting: *Deleted

## 2015-11-11 MED ORDER — OLMESARTAN-AMLODIPINE-HCTZ 20-5-12.5 MG PO TABS: 1.0000 | ORAL_TABLET | Freq: Every day | ORAL | Status: DC

## 2015-12-07 DIAGNOSIS — M79672 Pain in left foot: Secondary | ICD-10-CM | POA: Diagnosis not present

## 2015-12-07 DIAGNOSIS — M79671 Pain in right foot: Secondary | ICD-10-CM | POA: Diagnosis not present

## 2016-01-04 ENCOUNTER — Other Ambulatory Visit: Payer: Self-pay | Admitting: Cardiovascular Disease

## 2016-01-06 ENCOUNTER — Ambulatory Visit (INDEPENDENT_AMBULATORY_CARE_PROVIDER_SITE_OTHER): Payer: BLUE CROSS/BLUE SHIELD | Admitting: Cardiovascular Disease

## 2016-01-06 ENCOUNTER — Encounter (INDEPENDENT_AMBULATORY_CARE_PROVIDER_SITE_OTHER): Payer: Self-pay

## 2016-01-06 ENCOUNTER — Encounter: Payer: Self-pay | Admitting: Cardiovascular Disease

## 2016-01-06 VITALS — BP 112/74 | HR 67 | Ht 74.0 in | Wt 215.4 lb

## 2016-01-06 DIAGNOSIS — E663 Overweight: Secondary | ICD-10-CM | POA: Diagnosis not present

## 2016-01-06 DIAGNOSIS — E785 Hyperlipidemia, unspecified: Secondary | ICD-10-CM | POA: Diagnosis not present

## 2016-01-06 DIAGNOSIS — M05772 Rheumatoid arthritis with rheumatoid factor of left ankle and foot without organ or systems involvement: Secondary | ICD-10-CM

## 2016-01-06 DIAGNOSIS — I10 Essential (primary) hypertension: Secondary | ICD-10-CM | POA: Diagnosis not present

## 2016-01-06 DIAGNOSIS — M05771 Rheumatoid arthritis with rheumatoid factor of right ankle and foot without organ or systems involvement: Secondary | ICD-10-CM

## 2016-01-06 DIAGNOSIS — G4733 Obstructive sleep apnea (adult) (pediatric): Secondary | ICD-10-CM | POA: Diagnosis not present

## 2016-01-06 MED ORDER — HYDROCHLOROTHIAZIDE 12.5 MG PO CAPS: 12.5000 mg | ORAL_CAPSULE | ORAL | 11 refills | Status: DC

## 2016-01-06 MED ORDER — AMLODIPINE-OLMESARTAN 5-20 MG PO TABS: 1.0000 | ORAL_TABLET | Freq: Every day | ORAL | 11 refills | Status: DC

## 2016-01-06 NOTE — Progress Notes (Signed)
Cardiology Office Note    Date:  01/08/2016   ID:  Bobby Kramer, DOB 1970-07-24, MRN [id number]  PCP:  Pershing Proud  Cardiologist:   Thurmon Fair, MD   chief complaint: HTN   History of Present Illness:  Bobby Kramer is a 45 y.o. male with systemic hypertension with mild hypertensive heart disease (LVH echo 2013), but without coronary disease or clinically evident heart failure. He returns for routine follow-up. Additional problems include obstructive sleep apnea and he complies with CPAP. He has recently diagnosed with rheumatoid arthritis, which interestingly involves his feet more than it does his hands. He is now on methotrexate and Humira. He reports that he has a positive rheumatoid factor. His job involves walking long distances and climbing up and down stairwells constantly. He does not have exertional dyspnea. His legs bother him more than anything else. He has successfully lost 20 lb recently and plans to continue his efforts at weight loss. He is no longer obese, remains mildly overweight. BMI 27, waistline 36 inches. He has not checked his blood pressure, but has occasional problems with orthostatic dizziness.  Recent lipid profile showed a total cholesterol of 205, triglycerides 140, HDL 50, LDL 127. LFTs were normal, glucose 89, hemoglobin 14.1, creatinine 1.06    Past Medical History:  Diagnosis Date  . Hypertension   . Morton neuroma   . Seasonal allergies   . Sleep apnea    uses a cpap    Past Surgical History:  Procedure Laterality Date  . CARDIAC CATHETERIZATION  05/09/2012   EF 55% Normal Coronary Arteries  . DOPPLER ECHOCARDIOGRAPHY  09/18/2011   EF>55%  . EXCISION MORTON'S NEUROMA  03/06/2012   Procedure: EXCISION MORTON'S NEUROMA;  Surgeon: Toni Arthurs, MD;  Location: [facility];  Service: Orthopedics;  Laterality: Right;  EXCISION OF RIGHT THIRD WEB SPACE NEUROMA  HENDER RETRACTOR *ESMARK USED AS TOURNIQUET.  ON AT 7628.  OFF  AT 0826.  Marland Kitchen GXT  09/18/2011  . LEFT HEART CATHETERIZATION WITH CORONARY ANGIOGRAM Bilateral 05/26/2012   Procedure: LEFT HEART CATHETERIZATION WITH CORONARY ANGIOGRAM;  Surgeon: Thurmon Fair, MD;  Location: [facility];  Service: Cardiovascular;  Laterality: Bilateral;  . SYNOVECTOMY  03/06/2012   Procedure: SYNOVECTOMY;  Surgeon: Toni Arthurs, MD;  Location: [facility];  Service: Orthopedics;  Laterality: Right;  SYNOVECTOMY OF THIRD AND FOURTH METATARSAL PHALANGEAL JOINT  . WISDOM TOOTH EXTRACTION      Current Medications: Outpatient Medications Prior to Visit  Medication Sig Dispense Refill  . aspirin 81 MG tablet Take 81 mg by mouth daily.    . fluticasone (FLONASE) 50 MCG/ACT nasal spray Place 2 sprays into the nose daily as needed. For seasonal allergies    . Olmesartan-Amlodipine-HCTZ 20-5-12.5 MG TABS Take 1 tablet by mouth  daily 90 tablet 2   No facility-administered medications prior to visit.      Allergies:   Review of patient's allergies indicates no known allergies.   Social History   Social History  . Marital status: Married    Spouse name: N/A  . Number of children: N/A  . Years of education: N/A   Social History Main Topics  . Smoking status: Former Smoker    Quit date: 03/05/2007  . Smokeless tobacco: Former Neurosurgeon  . Alcohol use Yes     Comment: occ  . Drug use: No  . Sexual activity: Not Asked   Other Topics Concern  . None   Social History Narrative  .  None     Family History:  The patient's Family history significant for longevity and the absence of premature coronary retrocardiac problems   ROS:   Please see the history of present illness.    ROS All other systems reviewed and are negative.   PHYSICAL EXAM:   VS:  BP 112/74   Pulse 67   Ht 6\' 2"  (1.88 m)   Wt 215 lb 6.4 oz (97.7 kg)   BMI 27.66 kg/m    GEN: Well nourished, well developed, in no acute distress  HEENT: normal  Neck: no JVD, carotid bruits, or  masses Cardiac: RRR; no murmurs, rubs, or gallops,no edema  Respiratory:  clear to auscultation bilaterally, normal work of breathing GI: soft, nontender, nondistended, + BS MS: no deformity or atrophy  Skin: warm and dry, no rash Neuro:  Alert and Oriented x 3, Strength and sensation are intact Psych: euthymic mood, full affect  Wt Readings from Last 3 Encounters:  01/06/16 215 lb 6.4 oz (97.7 kg)  08/13/14 231 lb 3.2 oz (104.9 kg)  07/28/13 235 lb 8 oz (106.8 kg)      Studies/Labs Reviewed:   EKG:  EKG is ordered today.  The ekg ordered today demonstrates Normal sinus rhythm, nonspecific T-wave changes, no change from 2016  Recent Labs: LFTs normal, glucose 89, hemoglobin 14.1, creatinine 1.06  Lipid Panel 2017 total cholesterol of 205, triglycerides 140, HDL 50, LDL 127.  08/03/2014 TC 215, TG 148, LDL 142, HDL 43, non HDL 172 02/23/2014 TC 209, TG 223, LDL 122, HDL42, non HDL 167  ASSESSMENT:    1. Essential hypertension   2. Overweight   3. Hyperlipidemia   4. OSA (obstructive sleep apnea)   5. Rheumatoid arthritis involving both feet with positive rheumatoid factor (HCC)      PLAN:  In order of problems listed above:  1. HTN: Following his weight loss his blood pressure has improved and he requires fewer medications. We'll reduce the diuretic component to every other day and may discontinue it altogether. He did have LVH by previous echo. 2. Overweight: He is 02/25/2014 on his weight loss. There is already evidence of improvement in his blood pressure and his lipid profile. Continue attempts to reach a BMI of 25 or less and the waistline of 34 inches or less. 3. HLP:  Improved HDL and LDL. Lipids had a pattern consistent with metabolic sd./ insulin resistance/ small dense LDL. Continue exercise, judicious alcohol consumption, low saturated fat-low carb with high glycemic index diet with increased unsaturated fat, protein and complex carbs with low  glycemic index reviewed.  4. OSA: Also suspected to improve may be resolved with weight loss 5. RA: Reports plan to switch to a different biologic in the near future    Medication Adjustments/Labs and Tests Ordered: Current medicines are reviewed at length with the patient today.  Concerns regarding medicines are outlined above.  Medication changes, Labs and Tests ordered today are listed in the Patient Instructions below. Patient Instructions  Dr Musician has recommended making the following medication changes: 1. STOP Olmesartan-Amlodipine-HCT 2. CONTINUE Olmesart-Amlodipine daily 3. TAKE Hydrochlorothiazide 12.5 mg - take 1 tablet by mouth every other day  Dr Royann Shivers recommends that you schedule a follow-up appointment in 1 year. You will receive a reminder letter in the mail two months in advance. If you don't receive a letter, please call our office to schedule the follow-up appointment.  If you need a refill on your cardiac medications before your  next appointment, please call your pharmacy.    Signed, Thurmon Fair, MD  01/08/2016 10:23 AM    [facility] [address] [place], [place], [place]  [address] Phone: [phone]; Fax: [phone]

## 2016-01-06 NOTE — Patient Instructions (Addendum)
Dr Royann Shivers has recommended making the following medication changes: 1. STOP Olmesartan-Amlodipine-HCT 2. CONTINUE Olmesart-Amlodipine daily 3. TAKE Hydrochlorothiazide 12.5 mg - take 1 tablet by mouth every other day  Dr Royann Shivers recommends that you schedule a follow-up appointment in 1 year. You will receive a reminder letter in the mail two months in advance. If you don't receive a letter, please call our office to schedule the follow-up appointment.  If you need a refill on your cardiac medications before your next appointment, please call your pharmacy.

## 2016-01-08 DIAGNOSIS — M069 Rheumatoid arthritis, unspecified: Secondary | ICD-10-CM | POA: Insufficient documentation

## 2016-01-08 DIAGNOSIS — E663 Overweight: Secondary | ICD-10-CM | POA: Insufficient documentation

## 2016-01-08 DIAGNOSIS — E785 Hyperlipidemia, unspecified: Secondary | ICD-10-CM | POA: Insufficient documentation

## 2016-01-08 DIAGNOSIS — G4733 Obstructive sleep apnea (adult) (pediatric): Secondary | ICD-10-CM | POA: Insufficient documentation

## 2016-01-10 DIAGNOSIS — E559 Vitamin D deficiency, unspecified: Secondary | ICD-10-CM | POA: Diagnosis not present

## 2016-01-10 DIAGNOSIS — Z139 Encounter for screening, unspecified: Secondary | ICD-10-CM | POA: Diagnosis not present

## 2016-01-10 DIAGNOSIS — E782 Mixed hyperlipidemia: Secondary | ICD-10-CM | POA: Diagnosis not present

## 2016-01-10 DIAGNOSIS — Z79899 Other long term (current) drug therapy: Secondary | ICD-10-CM | POA: Diagnosis not present

## 2016-01-13 DIAGNOSIS — M0579 Rheumatoid arthritis with rheumatoid factor of multiple sites without organ or systems involvement: Secondary | ICD-10-CM | POA: Diagnosis not present

## 2016-01-31 DIAGNOSIS — Z008 Encounter for other general examination: Secondary | ICD-10-CM | POA: Diagnosis not present

## 2016-01-31 DIAGNOSIS — I10 Essential (primary) hypertension: Secondary | ICD-10-CM | POA: Diagnosis not present

## 2016-01-31 DIAGNOSIS — M05771 Rheumatoid arthritis with rheumatoid factor of right ankle and foot without organ or systems involvement: Secondary | ICD-10-CM | POA: Diagnosis not present

## 2016-01-31 DIAGNOSIS — E559 Vitamin D deficiency, unspecified: Secondary | ICD-10-CM | POA: Diagnosis not present

## 2016-02-07 DIAGNOSIS — M0579 Rheumatoid arthritis with rheumatoid factor of multiple sites without organ or systems involvement: Secondary | ICD-10-CM | POA: Diagnosis not present

## 2016-02-07 DIAGNOSIS — M79671 Pain in right foot: Secondary | ICD-10-CM | POA: Diagnosis not present

## 2016-02-07 DIAGNOSIS — Z9225 Personal history of immunosupression therapy: Secondary | ICD-10-CM | POA: Diagnosis not present

## 2016-02-07 DIAGNOSIS — Z09 Encounter for follow-up examination after completed treatment for conditions other than malignant neoplasm: Secondary | ICD-10-CM | POA: Diagnosis not present

## 2016-02-23 DIAGNOSIS — Z139 Encounter for screening, unspecified: Secondary | ICD-10-CM | POA: Diagnosis not present

## 2016-02-23 DIAGNOSIS — E782 Mixed hyperlipidemia: Secondary | ICD-10-CM | POA: Diagnosis not present

## 2016-02-23 DIAGNOSIS — Z79899 Other long term (current) drug therapy: Secondary | ICD-10-CM | POA: Diagnosis not present

## 2016-03-07 DIAGNOSIS — Z23 Encounter for immunization: Secondary | ICD-10-CM | POA: Diagnosis not present

## 2016-03-13 DIAGNOSIS — R7989 Other specified abnormal findings of blood chemistry: Secondary | ICD-10-CM | POA: Diagnosis not present

## 2016-03-15 DIAGNOSIS — R7989 Other specified abnormal findings of blood chemistry: Secondary | ICD-10-CM | POA: Diagnosis not present

## 2016-03-23 ENCOUNTER — Telehealth: Payer: Self-pay | Admitting: Rheumatology

## 2016-03-23 NOTE — Telephone Encounter (Signed)
Patient states he loaded and this is his first maintenance dose, he has some foot and hip pain. He is due for his next dose this weekend.  He describes pain over his trochanters when he sleeps. I told him I do not think this is RA, but his foot pain sounds like it may be. He describes popping and pain over his mid foot. He wants you to be aware. I did indicate to him we may just need to give the Cimzia more time to work for him. Will you review. Ronda Fairly

## 2016-03-23 NOTE — Telephone Encounter (Signed)
Patient called about symptoms he is having on Cimzia. Patient states he did great on the medication for about three weeks and now he is having bad pain in his hip and left foot. Patient wanted to report the symptoms he is having.

## 2016-03-24 NOTE — Telephone Encounter (Signed)
I called pt on Saturday 9:17pm ( our office just started epic this past week and couldn't return call same day.)  Pt did not answer my call so I left message. [phone] (Home) [phone] (Mobile)Phone  Amy's message reviewed and I agree w/ message.  I wanted to confirm pt's symptoms personally and was unable to reach.  I will try to call pt tomorrow or on Monday.  I advised him to call me back since I am oncall to discuss any urgent questions or concerns.

## 2016-03-26 NOTE — Telephone Encounter (Signed)
I called pt back on his cell. No answer.  Advised to call back if there is further need. I agree with your previous assessment recorded on your last phone call.

## 2016-03-26 NOTE — Telephone Encounter (Signed)
His need was not urgent, he has had continued pain hips (at night he describes over trochanters) also foot pain/ popping/ I have advised him to give Cimzia more time. Do you agree, or is there something else you would like to advise.

## 2016-04-02 ENCOUNTER — Other Ambulatory Visit: Payer: Self-pay | Admitting: Rheumatology

## 2016-04-02 NOTE — Telephone Encounter (Signed)
Last visit 02/07/16 Next visit 06/01/16 Labs 03/14/16  Ok to refill per Dr Corliss Skains

## 2016-04-24 ENCOUNTER — Telehealth: Payer: Self-pay | Admitting: Radiology

## 2016-04-24 NOTE — Telephone Encounter (Signed)
Refill request received via fax for Cimzia from [facility]

## 2016-04-25 MED ORDER — CERTOLIZUMAB PEGOL 2 X 200 MG/ML ~~LOC~~ KIT: 2.0000 | PACK | SUBCUTANEOUS | 0 refills | Status: DC

## 2016-04-25 NOTE — Telephone Encounter (Signed)
02/07/16 last visit  Next visit 06/01/16 03/16/16 labs WNL TB neg 02/10/16 Ok to refill per Dr Corliss Skains

## 2016-05-08 DIAGNOSIS — Z79899 Other long term (current) drug therapy: Secondary | ICD-10-CM | POA: Diagnosis not present

## 2016-05-15 DIAGNOSIS — Z7189 Other specified counseling: Secondary | ICD-10-CM | POA: Diagnosis not present

## 2016-05-31 DIAGNOSIS — Z8669 Personal history of other diseases of the nervous system and sense organs: Secondary | ICD-10-CM | POA: Insufficient documentation

## 2016-05-31 DIAGNOSIS — Z79899 Other long term (current) drug therapy: Secondary | ICD-10-CM | POA: Insufficient documentation

## 2016-05-31 DIAGNOSIS — Z1589 Genetic susceptibility to other disease: Secondary | ICD-10-CM | POA: Insufficient documentation

## 2016-05-31 DIAGNOSIS — M7062 Trochanteric bursitis, left hip: Secondary | ICD-10-CM

## 2016-05-31 DIAGNOSIS — M7061 Trochanteric bursitis, right hip: Secondary | ICD-10-CM | POA: Insufficient documentation

## 2016-05-31 DIAGNOSIS — Z8679 Personal history of other diseases of the circulatory system: Secondary | ICD-10-CM | POA: Insufficient documentation

## 2016-05-31 NOTE — Progress Notes (Signed)
Office Visit Note  Patient: Bobby Kramer             Date of Birth: 07/21/1970           MRN: [id number]             PCP: Jana Half Referring: Jake Samples, PA* Visit Date: 06/01/2016 Occupation: '@GUAROCC'$ @    Subjective:  Pain of the Neck; Pain of the Left Shoulder; and Follow-up (feels as though Humira may have helped more than the Cimzia )   History of Present Illness: Bobby Kramer is a 46 y.o. male  Patient is complaining of increased joint pain for the last 6-8 weeks with some pain also in the lower neck area/lower thoracic area.   Currently on Cimzia. He is also on methotrexate 8 pills per week. Folic acid 2 pills per day.    PREVIOUS HPI from September 13, 2015 visit ===> When patient was on Humira and methotrexate, this is the conversation he had with Dr. Estanislado Pandy on 09/13/2015 visit: He is on high risk prescriptions, taking methotrexate and Humira which are working very well for him although he is not quite satisfied and is quite anxious about the progression of his arthritis.  On 12/07/2015 visit, he had an ultrasound done. ===> :  Rheumatoid arthritis with mild inflammation in the above-mentioned MTP joints.  I had a detailed discussion with the patient.  He has been on Humira since October now.  He believes that it is not working as well and he would like to switch medications.   Note that we had considered applying for Enbrel but that was denied from his insurance. As a result Cimzia was approved and we started him on Cimzia. He received his first dose on 01/13/2016.  N.B.: The next medication that we can use for the patient is SIMPONI if Cimzia fails   Activities of Daily Living:  Patient reports morning stiffness for 20 minutes.   Patient Reports nocturnal pain.  Difficulty dressing/grooming: Reports Difficulty climbing stairs: Denies Difficulty getting out of chair: Denies Difficulty using hands for taps, buttons, cutlery, and/or writing:  Denies   Review of Systems  Constitutional: Negative for fatigue.  HENT: Negative for mouth sores and mouth dryness.   Eyes: Negative for dryness.  Respiratory: Negative for shortness of breath.   Gastrointestinal: Negative for constipation and diarrhea.  Musculoskeletal: Negative for myalgias and myalgias.  Skin: Negative for sensitivity to sunlight.  Neurological: Negative for memory loss.  Psychiatric/Behavioral: Negative for sleep disturbance.    PMFS History:  Patient Active Problem List   Diagnosis Date Noted  . High risk medication use 05/31/2016  . History of hypertension 05/31/2016  . History of sleep apnea 05/31/2016  . HLA B27 (HLA B27 positive) 05/31/2016  . Trochanteric bursitis of both hips 05/31/2016  . OSA (obstructive sleep apnea) 01/08/2016  . Overweight 01/08/2016  . Hyperlipidemia 01/08/2016  . Rheumatoid arthritis ([place]) 01/08/2016  . Cervicalgia 11/13/2013  . Sprain of neck 11/13/2013  . HTN (hypertension) 07/28/2013  . Mild obesity 07/28/2013    Past Medical History:  Diagnosis Date  . Hypertension   . Morton neuroma   . Seasonal allergies   . Sleep apnea    uses a cpap    No family history on file. Past Surgical History:  Procedure Laterality Date  . CARDIAC CATHETERIZATION  05/09/2012   EF 55% Normal Coronary Arteries  . DOPPLER ECHOCARDIOGRAPHY  09/18/2011   EF>55%  . EXCISION MORTON'S NEUROMA  03/06/2012   Procedure: EXCISION MORTON'S NEUROMA;  Surgeon: Wylene Simmer, MD;  Location: [facility];  Service: Orthopedics;  Laterality: Right;  EXCISION OF RIGHT THIRD WEB SPACE NEUROMA  HENDER RETRACTOR *[place] USED AS TOURNIQUET.  ON AT 9562.  OFF AT 0826.  Marland Kitchen GXT  09/18/2011  . LEFT HEART CATHETERIZATION WITH CORONARY ANGIOGRAM Bilateral 05/26/2012   Procedure: LEFT HEART CATHETERIZATION WITH CORONARY ANGIOGRAM;  Surgeon: Sanda Klein, MD;  Location: [facility];  Service: Cardiovascular;  Laterality: Bilateral;  . SYNOVECTOMY   03/06/2012   Procedure: SYNOVECTOMY;  Surgeon: Wylene Simmer, MD;  Location: [facility];  Service: Orthopedics;  Laterality: Right;  SYNOVECTOMY OF THIRD AND FOURTH METATARSAL PHALANGEAL JOINT  . WISDOM TOOTH EXTRACTION     Social History   Social History Narrative  . No narrative on file     Objective: Vital Signs: BP 124/72   Pulse 72   Ht '6\' 2"'$  (1.88 m)   Wt 223 lb (101.2 kg)   BMI 28.63 kg/m    Physical Exam  Constitutional: He is oriented to person, place, and time. He appears well-developed and well-nourished.  HENT:  Head: Normocephalic and atraumatic.  Eyes: Conjunctivae and EOM are normal. Pupils are equal, round, and reactive to light.  Neck: Normal range of motion. Neck supple.  Cardiovascular: Normal rate, regular rhythm and normal heart sounds.  Exam reveals no gallop and no friction rub.   No murmur heard. Pulmonary/Chest: Effort normal and breath sounds normal. No respiratory distress. He has no wheezes. He has no rales. He exhibits no tenderness.  Abdominal: Soft. He exhibits no distension and no mass. There is no tenderness. There is no guarding.  Musculoskeletal: Normal range of motion.  Lymphadenopathy:    He has no cervical adenopathy.  Neurological: He is alert and oriented to person, place, and time. He exhibits normal muscle tone. Coordination normal.  Skin: Skin is warm and dry. Capillary refill takes less than 2 seconds. No rash noted.  Psychiatric: He has a normal mood and affect. His behavior is normal. Judgment and thought content normal.  Nursing note and vitals reviewed.    Musculoskeletal Exam:  Full range of motion of all joints Grip strength is equal and strong bilaterally Fiber myalgia tender points are all absent  CDAI Exam: CDAI Homunculus Exam:   Joint Counts:  CDAI Tender Joint count: 0 CDAI Swollen Joint count: 0  Global Assessments:  Patient Global Assessment: 5 Provider Global Assessment: 5  CDAI Calculated  Score: 10    Investigation: Findings:  02/10/16 negative TB gold   Labs:  03/13/2016: ===> CMP with GFR normal  Luetta Nutting, FNP's office in [place].  02/23/2016:===>  CBC with differential is normal at [employer], FNP's office in [place].  June 2016:  CBC, comprehensive metabolic panel, CK, TSH, C-reactive protein, UA, uric acid, ACE level were within normal limits.  Sed rate was 4, rheumatoid factor was 72, CCP was 165.3, HLA-B27 was positive and vitamin D was low at 28.  MRI of his right foot showed dislocation of the proximal 4th phalanx and large effusion, edema, metatarsal erosion.  I did obtain x-rays of the bilateral hands in the office today which showed minimal PIP changes, no MCP changes, no erosive changes.    He had no erosions on his foot x-ray which he brought from 2010.   His labs from 10/2014 hepatitis panel, HIV, SPEP, immunoglobulin's and TB Gold were all negative.    Imaging: No results  found.  Speciality Comments: No specialty comments available.    Procedures:  No procedures performed Allergies: Patient has no known allergies.   Assessment / Plan:     Visit Diagnoses: Rheumatoid arthritis involving multiple sites with positive rheumatoid factor (HCC)  High risk medication use - Cimzia and Methotrexate;  inadequate response to Humira   History of hypertension  History of sleep apnea  HLA B27 (HLA B27 positive)  Trochanteric bursitis of both hips  Pain in thoracic spine   Plan: #1: History of rheumatoid arthritis. Doing well with the hands and has never had any issues with the hands. Patient is complaining of feet pain and now some upper thoracic pain. Feels like his medicine is not working adequately. He feels that the Humira was actually working better. We discussed the discontinuing 1 biologic and moving to another is fine but it is very possible that the insurance company will not let us go back to the previous one which may have been  working better. I advise caution as we consider if Magnus Sinning is working well or not. He will give Cimzia a few more months to see if they will work and try other medicines in the meanwhile to help his thoracic pain/feet pain.  #2: Return to clinic in 3 months for follow-up on rheumatoid arthritis, Cimzia, methotrexate  #3:Ultrasound bilateral feet in approximately 6-8 weeks Rule out active disease. (Patient hardly has any hand pain so we do not need to ultrasound the hands.).  #4: Prescription for Voltaren gel use as directed  #5: Probable osteoarthritis to bilateral feet and hands along with rheumatoid arthritis  #6: Consider Simponi if Cimzia fails. Note we applied for Enbrel previously and his insurance company said no to Enbrel and wanted [place] to use Cimzia. In the next medication would be Simponi.  #7: Patient is stooping over his desk which may be exacerbating his upper thoracic pain/neck pain. He may benefit from a standing desk. I've written a prescription for this. He will discuss this with his workplace.  #8: Exercise for back pain and thoracic pain given to the patient Orders: No orders of the defined types were placed in this encounter.  Meds ordered this encounter  Medications  . diclofenac sodium (VOLTAREN) 1 % GEL    Sig: Voltaren Gel 3 grams to 3 large joints upto TID 3 TUBES with 3 refills    Dispense:  3 Tube    Refill:  3    Voltaren Gel 3 grams to 3 large joints upto TID 3 TUBES with 3 refills    Order Specific Question:   Supervising Provider    Answer:   Lyda Perone    Face-to-face time spent with patient was 30 minutes. 50% of time was spent in counseling and coordination of care.  Follow-Up Instructions: Return in about 3 months (around 08/30/2016) for ra,cimzia, mtx 8/wk, rt feet pain,.   Rayshun Kandler, PA-C Is some rheumatoid arthritis seems to be under good control with Cimzia. He is mostly having thoracic pain. I've advised him to get a  standing desk at work. Also discussed some back exercises. We will do ultrasound to evaluate for ongoing synovitis. I examined and evaluated the patient with Eliezer Lofts PA. The plan of care was discussed as noted above.  Bo Merino, MD Note - This record has been created using Editor, commissioning.  Chart creation errors have been sought, but may not always  have been located. Such creation errors do not reflect on  the standard of medical care.

## 2016-06-01 ENCOUNTER — Ambulatory Visit (INDEPENDENT_AMBULATORY_CARE_PROVIDER_SITE_OTHER): Payer: BLUE CROSS/BLUE SHIELD | Admitting: Rheumatology

## 2016-06-01 ENCOUNTER — Encounter: Payer: Self-pay | Admitting: Rheumatology

## 2016-06-01 ENCOUNTER — Ambulatory Visit: Payer: Self-pay | Admitting: Rheumatology

## 2016-06-01 VITALS — BP 124/72 | HR 72 | Ht 74.0 in | Wt 223.0 lb

## 2016-06-01 DIAGNOSIS — M7062 Trochanteric bursitis, left hip: Secondary | ICD-10-CM | POA: Diagnosis not present

## 2016-06-01 DIAGNOSIS — Z1589 Genetic susceptibility to other disease: Secondary | ICD-10-CM | POA: Diagnosis not present

## 2016-06-01 DIAGNOSIS — Z8679 Personal history of other diseases of the circulatory system: Secondary | ICD-10-CM

## 2016-06-01 DIAGNOSIS — Z79899 Other long term (current) drug therapy: Secondary | ICD-10-CM | POA: Diagnosis not present

## 2016-06-01 DIAGNOSIS — M0579 Rheumatoid arthritis with rheumatoid factor of multiple sites without organ or systems involvement: Secondary | ICD-10-CM

## 2016-06-01 DIAGNOSIS — M7061 Trochanteric bursitis, right hip: Secondary | ICD-10-CM

## 2016-06-01 DIAGNOSIS — Z8669 Personal history of other diseases of the nervous system and sense organs: Secondary | ICD-10-CM

## 2016-06-01 DIAGNOSIS — M546 Pain in thoracic spine: Secondary | ICD-10-CM

## 2016-06-01 MED ORDER — DICLOFENAC SODIUM 1 % TD GEL: TRANSDERMAL | 3 refills | Status: DC

## 2016-06-01 NOTE — Patient Instructions (Signed)
Back Exercises Introduction If you have pain in your back, do these exercises 2-3 times each day or as told by your doctor. When the pain goes away, do the exercises once each day, but repeat the steps more times for each exercise (do more repetitions). If you do not have pain in your back, do these exercises once each day or as told by your doctor. Exercises Single Knee to Chest  Do these steps 3-5 times in a row for each leg: 1. Lie on your back on a firm bed or the floor with your legs stretched out. 2. Bring one knee to your chest. 3. Hold your knee to your chest by grabbing your knee or thigh. 4. Pull on your knee until you feel a gentle stretch in your lower back. 5. Keep doing the stretch for 10-30 seconds. 6. Slowly let go of your leg and straighten it. Pelvic Tilt  Do these steps 5-10 times in a row: 1. Lie on your back on a firm bed or the floor with your legs stretched out. 2. Bend your knees so they point up to the ceiling. Your feet should be flat on the floor. 3. Tighten your lower belly (abdomen) muscles to press your lower back against the floor. This will make your tailbone point up to the ceiling instead of pointing down to your feet or the floor. 4. Stay in this position for 5-10 seconds while you gently tighten your muscles and breathe evenly. Cat-Cow  Do these steps until your lower back bends more easily: 1. Get on your hands and knees on a firm surface. Keep your hands under your shoulders, and keep your knees under your hips. You may put padding under your knees. 2. Let your head hang down, and make your tailbone point down to the floor so your lower back is round like the back of a cat. 3. Stay in this position for 5 seconds. 4. Slowly lift your head and make your tailbone point up to the ceiling so your back hangs low (sags) like the back of a cow. 5. Stay in this position for 5 seconds. Press-Ups  Do these steps 5-10 times in a row: 1. Lie on your belly  (face-down) on the floor. 2. Place your hands near your head, about shoulder-width apart. 3. While you keep your back relaxed and keep your hips on the floor, slowly straighten your arms to raise the top half of your body and lift your shoulders. Do not use your back muscles. To make yourself more comfortable, you may change where you place your hands. 4. Stay in this position for 5 seconds. 5. Slowly return to lying flat on the floor. Bridges  Do these steps 10 times in a row: 1. Lie on your back on a firm surface. 2. Bend your knees so they point up to the ceiling. Your feet should be flat on the floor. 3. Tighten your butt muscles and lift your butt off of the floor until your waist is almost as high as your knees. If you do not feel the muscles working in your butt and the back of your thighs, slide your feet 1-2 inches farther away from your butt. 4. Stay in this position for 3-5 seconds. 5. Slowly lower your butt to the floor, and let your butt muscles relax. If this exercise is too easy, try doing it with your arms crossed over your chest. Belly Crunches  Do these steps 5-10 times in a row: 1. Lie   on your back on a firm bed or the floor with your legs stretched out. 2. Bend your knees so they point up to the ceiling. Your feet should be flat on the floor. 3. Cross your arms over your chest. 4. Tip your chin a little bit toward your chest but do not bend your neck. 5. Tighten your belly muscles and slowly raise your chest just enough to lift your shoulder blades a tiny bit off of the floor. 6. Slowly lower your chest and your head to the floor. Back Lifts  Do these steps 5-10 times in a row: 1. Lie on your belly (face-down) with your arms at your sides, and rest your forehead on the floor. 2. Tighten the muscles in your legs and your butt. 3. Slowly lift your chest off of the floor while you keep your hips on the floor. Keep the back of your head in line with the curve in your back.  Look at the floor while you do this. 4. Stay in this position for 3-5 seconds. 5. Slowly lower your chest and your face to the floor. Contact a doctor if:  Your back pain gets a lot worse when you do an exercise.  Your back pain does not lessen 2 hours after you exercise. If you have any of these problems, stop doing the exercises. Do not do them again unless your doctor says it is okay. Get help right away if:  You have sudden, very bad back pain. If this happens, stop doing the exercises. Do not do them again unless your doctor says it is okay. This information is not intended to replace advice given to you by your health care provider. Make sure you discuss any questions you have with your health care provider. Document Released: 06/16/2010 Document Revised: 10/20/2015 Document Reviewed: 07/08/2014  2017 Elsevier Thoracic Strain Thoracic strain is an injury to the muscles or tendons that attach to the upper back. A strain can be mild or severe. A mild strain may take only 1-2 weeks to heal. A severe strain involves torn muscles or tendons, so it may take 6-8 weeks to heal. Follow these instructions at home:  Rest as needed. Limit your activity as told by your doctor.  If directed, put ice on the injured area:  Put ice in a plastic bag.  Place a towel between your skin and the bag.  Leave the ice on for 20 minutes, 2-3 times per day.  Take over-the-counter and prescription medicines only as told by your doctor.  Begin doing exercises as told by your doctor or physical therapist.  Warm up before being active.  Bend your knees before you lift heavy objects.  Keep all follow-up visits as told by your doctor. This is important. Contact a doctor if:  Your pain is not helped by medicine.  Your pain, bruising, or swelling is getting worse.  You have a fever. Get help right away if:  You have shortness of breath.  You have chest pain.  You have weakness or loss of  feeling (numbness) in your legs.  You cannot control when you pee (urinate). This information is not intended to replace advice given to you by your health care provider. Make sure you discuss any questions you have with your health care provider. Document Released: 10/31/2007 Document Revised: 01/14/2016 Document Reviewed: 07/08/2014 Elsevier Interactive Patient Education  2017 [employer].

## 2016-06-11 DIAGNOSIS — R6889 Other general symptoms and signs: Secondary | ICD-10-CM | POA: Diagnosis not present

## 2016-06-11 DIAGNOSIS — Z6828 Body mass index (BMI) 28.0-28.9, adult: Secondary | ICD-10-CM | POA: Diagnosis not present

## 2016-06-11 DIAGNOSIS — E663 Overweight: Secondary | ICD-10-CM | POA: Diagnosis not present

## 2016-06-11 DIAGNOSIS — Z1389 Encounter for screening for other disorder: Secondary | ICD-10-CM | POA: Diagnosis not present

## 2016-06-14 ENCOUNTER — Emergency Department (HOSPITAL_COMMUNITY)
Admission: EM | Admit: 2016-06-14 | Discharge: 2016-06-14 | Disposition: A | Discharge status: Home / Self Care | Payer: BLUE CROSS/BLUE SHIELD | Attending: Emergency Medicine | Admitting: Emergency Medicine

## 2016-06-14 ENCOUNTER — Encounter (HOSPITAL_COMMUNITY): Payer: Self-pay | Admitting: *Deleted

## 2016-06-14 ENCOUNTER — Emergency Department (HOSPITAL_COMMUNITY): Payer: BLUE CROSS/BLUE SHIELD

## 2016-06-14 DIAGNOSIS — Z79899 Other long term (current) drug therapy: Secondary | ICD-10-CM | POA: Insufficient documentation

## 2016-06-14 DIAGNOSIS — Z87891 Personal history of nicotine dependence: Secondary | ICD-10-CM | POA: Diagnosis not present

## 2016-06-14 DIAGNOSIS — M25551 Pain in right hip: Secondary | ICD-10-CM | POA: Diagnosis not present

## 2016-06-14 DIAGNOSIS — I10 Essential (primary) hypertension: Secondary | ICD-10-CM | POA: Diagnosis not present

## 2016-06-14 HISTORY — DX: Rheumatoid arthritis, unspecified: M06.9

## 2016-06-14 LAB — CBC
HEMATOCRIT: 37.6 % — AB (ref 39.0–52.0)
Hemoglobin: 12.6 g/dL — ABNORMAL LOW (ref 13.0–17.0)
MCH: 30.9 pg (ref 26.0–34.0)
MCHC: 33.5 g/dL (ref 30.0–36.0)
MCV: 92.2 fL (ref 78.0–100.0)
PLATELETS: 174 10*3/uL (ref 150–400)
RBC: 4.08 MIL/uL — AB (ref 4.22–5.81)
RDW: 11.8 % (ref 11.5–15.5)
WBC: 7.4 10*3/uL (ref 4.0–10.5)

## 2016-06-14 LAB — BASIC METABOLIC PANEL
ANION GAP: 8 (ref 5–15)
BUN: 15 mg/dL (ref 6–20)
CO2: 23 mmol/L (ref 22–32)
Calcium: 8.8 mg/dL — ABNORMAL LOW (ref 8.9–10.3)
Chloride: 106 mmol/L (ref 101–111)
Creatinine, Ser: 0.83 mg/dL (ref 0.61–1.24)
GFR calc Af Amer: >60 mL/min (ref >60)
Glucose, Bld: 109 mg/dL — ABNORMAL HIGH (ref 65–99)
POTASSIUM: 4 mmol/L (ref 3.5–5.1)
Sodium: 137 mmol/L (ref 135–145)

## 2016-06-14 LAB — C-REACTIVE PROTEIN: CRP: 3.7 mg/dL — ABNORMAL HIGH (ref <1.0)

## 2016-06-14 LAB — SEDIMENTATION RATE: Sed Rate: 29 mm/hr — ABNORMAL HIGH (ref 0–16)

## 2016-06-14 MED ORDER — OXYCODONE-ACETAMINOPHEN 5-325 MG PO TABS
1.0000 | ORAL_TABLET | Freq: Once | ORAL | Status: AC
  Administered 2016-06-14: 1 via ORAL
  Filled 2016-06-14: qty 1

## 2016-06-14 MED ORDER — DEXAMETHASONE SODIUM PHOSPHATE 4 MG/ML IJ SOLN
10.0000 mg | Freq: Once | INTRAMUSCULAR | Status: AC
  Administered 2016-06-14: 10 mg via INTRAVENOUS
  Filled 2016-06-14: qty 3

## 2016-06-14 MED ORDER — PREDNISONE 20 MG PO TABS: 60.0000 mg | ORAL_TABLET | Freq: Every day | ORAL | 0 refills | Status: DC

## 2016-06-14 MED ORDER — METHYLPREDNISOLONE SODIUM SUCC 125 MG IJ SOLR: 125.0000 mg | Freq: Once | INTRAMUSCULAR | Status: DC

## 2016-06-14 MED ORDER — OXYCODONE-ACETAMINOPHEN 5-325 MG PO TABS: 1.0000 | ORAL_TABLET | Freq: Three times a day (TID) | ORAL | 0 refills | Status: DC | PRN

## 2016-06-14 NOTE — ED Triage Notes (Addendum)
Pt c/o right hip pain that started yesterday and has progressively gotten worse. Pt woke up this morning and is unable to ambulate. Pt bears slight pressure on right leg when transferring from wheelchair to bed. Pt denies injury. Reports he has RA. Pt reports is on twice daily Tamiflu since Monday. Denies fever but does report congestion.

## 2016-06-14 NOTE — ED Notes (Signed)
Pt denies any needs at this time. Family at bedside.

## 2016-06-14 NOTE — Discharge Instructions (Signed)
Please read and follow all provided instructions.  Your diagnoses today include:  1. Right hip pain    Tests performed today include: Vital signs. See below for your results today.   Medications prescribed:  Take as prescribed. Stop Tamiflu. Take Methotrexate    Home care instructions:  Follow any educational materials contained in this packet.  Follow-up instructions: Please follow-up with your Orthopedics for further evaluation of symptoms and treatment   Return instructions:  Please return to the Emergency Department if you do not get better, if you get worse, or new symptoms OR  - Fever (temperature greater than 101.96F)  - Bleeding that does not stop with holding pressure to the area    -Severe pain (please note that you may be more sore the day after your accident)  - Chest Pain  - Difficulty breathing  - Severe nausea or vomiting  - Inability to tolerate food and liquids  - Passing out  - Skin becoming red around your wounds  - Change in mental status (confusion or lethargy)  - New numbness or weakness    Please return if you have any other emergent concerns.  Additional Information:  Your vital signs today were: BP 127/89 (BP Location: Right Arm)    Pulse 65    Temp 98.3 F (36.8 C) (Oral)    Resp 18    Ht 6\' 2"  (1.88 m)    Wt 100.2 kg    SpO2 100%    BMI 28.37 kg/m  If your blood pressure (BP) was elevated above 135/85 this visit, please have this repeated by your doctor within one month. ---------------

## 2016-06-14 NOTE — ED Provider Notes (Signed)
[facility] DEPT Provider Note   CSN: [id number] Arrival date & time: 06/14/16  0850     History   Chief Complaint Chief Complaint  Patient presents with  . Hip Pain    HPI Bobby Kramer is a 46 y.o. male.  HPI  46 y.o. male with a hx of HTN, RA, presents to the Emergency Department today complaining of right hip pain that began yesterday. Notes no trauma to area. States pain has progressively gotten worse. Notes feeling a "hitch" in his ambulation. Notes sharp 8/10 pain with movement. Notes hx of RA. On Simzia and Methotrexate. Currently off methotrexate recently due to being on Tamiflu since Monday. Next dose is today. No fevers. No N/V. No numbness/tingling. Pt has been ambulating with crutches. No other symptoms noted.   Past Medical History:  Diagnosis Date  . Hypertension   . Rheumatoid arthritis ([place])   . Seasonal allergies   . Sleep apnea    uses a cpap    Patient Active Problem List   Diagnosis Date Noted  . High risk medication use 05/31/2016  . History of hypertension 05/31/2016  . History of sleep apnea 05/31/2016  . HLA B27 (HLA B27 positive) 05/31/2016  . Trochanteric bursitis of both hips 05/31/2016  . OSA (obstructive sleep apnea) 01/08/2016  . Overweight 01/08/2016  . Hyperlipidemia 01/08/2016  . Rheumatoid arthritis ([place]) 01/08/2016  . Cervicalgia 11/13/2013  . Sprain of neck 11/13/2013  . HTN (hypertension) 07/28/2013  . Mild obesity 07/28/2013    Past Surgical History:  Procedure Laterality Date  . CARDIAC CATHETERIZATION  05/09/2012   EF 55% Normal Coronary Arteries  . DOPPLER ECHOCARDIOGRAPHY  09/18/2011   EF>55%  . EXCISION MORTON'S NEUROMA  03/06/2012   Procedure: EXCISION MORTON'S NEUROMA;  Surgeon: Wylene Simmer, MD;  Location: [facility];  Service: Orthopedics;  Laterality: Right;  EXCISION OF RIGHT THIRD WEB SPACE NEUROMA  HENDER RETRACTOR *[place] USED AS TOURNIQUET.  ON AT 5945.  OFF AT 0826.  Marland Kitchen GXT  09/18/2011  .  LEFT HEART CATHETERIZATION WITH CORONARY ANGIOGRAM Bilateral 05/26/2012   Procedure: LEFT HEART CATHETERIZATION WITH CORONARY ANGIOGRAM;  Surgeon: Sanda Klein, MD;  Location: [facility];  Service: Cardiovascular;  Laterality: Bilateral;  . SYNOVECTOMY  03/06/2012   Procedure: SYNOVECTOMY;  Surgeon: Wylene Simmer, MD;  Location: [facility];  Service: Orthopedics;  Laterality: Right;  SYNOVECTOMY OF THIRD AND FOURTH METATARSAL PHALANGEAL JOINT  . WISDOM TOOTH EXTRACTION         Home Medications    Prior to Admission medications   Medication Sig Start Date End Date Taking? Authorizing Provider  amLODipine-olmesartan (AZOR) 5-20 MG tablet Take 1 tablet by mouth daily. 01/06/16   Mihai Croitoru, MD  aspirin 81 MG tablet Take 81 mg by mouth daily.    Historical Provider, MD  Certolizumab Pegol 2 X 200 MG/ML KIT Inject 2 Syringes into the skin every 30 (thirty) days. 04/25/16   Bo Merino, MD  diclofenac sodium (VOLTAREN) 1 % GEL Voltaren Gel 3 grams to 3 large joints upto TID 3 TUBES with 3 refills 06/01/16   Naitik Panwala, PA-C  fluticasone (FLONASE) 50 MCG/ACT nasal spray Place 2 sprays into the nose daily as needed. For seasonal allergies    Historical Provider, MD  FOLIC ACID PO Take 2 tablets by mouth daily.     Historical Provider, MD  hydrochlorothiazide (MICROZIDE) 12.5 MG capsule Take 1 capsule (12.5 mg total) by mouth every other day. 01/06/16 12/31/16  Mihai Croitoru, MD  methotrexate (RHEUMATREX) 2.5 MG tablet TAKE 8 TABLETS BY MOUTH  EVERY WEEK 04/02/16   Bo Merino, MD  Multiple Vitamin (MULTIVITAMIN) tablet Take 1 tablet by mouth daily.    Historical Provider, MD    Family History No family history on file.  Social History Social History  Substance Use Topics  . Smoking status: Former Smoker    Quit date: 03/05/2007  . Smokeless tobacco: Former Systems developer  . Alcohol use Yes     Comment: occ     Allergies   Patient has no known allergies.   Review of  Systems Review of Systems  Constitutional: Negative for fever.  Gastrointestinal: Negative for nausea and vomiting.  Musculoskeletal: Positive for arthralgias.  Skin: Negative for rash and wound.   Physical Exam Updated Vital Signs BP 135/75   Pulse 71   Temp 98.3 F (36.8 C) (Oral)   Resp 17   Ht _0  (1.88 m)   Wt 100.2 kg   SpO2 97%   BMI 28.37 kg/m   Physical Exam  Constitutional: He is oriented to person, place, and time. Vital signs are normal. He appears well-developed and well-nourished.  HENT:  Head: Normocephalic and atraumatic.  Right Ear: Hearing normal.  Left Ear: Hearing normal.  Eyes: Conjunctivae and EOM are normal. Pupils are equal, round, and reactive to light.  Neck: Normal range of motion. Neck supple.  Cardiovascular: Normal rate and regular rhythm.   Pulmonary/Chest: Effort normal.  Musculoskeletal: Normal range of motion.  Right Hip NVI. Distal pulses appreciated. Sensation/motor intact. Pain on internal/external rotation. Pain with eversion/inversion. No erythema. No obvious deformities.   Neurological: He is alert and oriented to person, place, and time.  Skin: Skin is warm and dry.  Psychiatric: He has a normal mood and affect. His speech is normal and behavior is normal. Thought content normal.  Nursing note and vitals reviewed.  ED Treatments / Results  Labs (all labs ordered are listed, but only abnormal results are displayed) Labs Reviewed  CBC - Abnormal; Notable for the following:       Result Value   RBC 4.08 (*)    Hemoglobin 12.6 (*)    HCT 37.6 (*)    All other components within normal limits  BASIC METABOLIC PANEL - Abnormal; Notable for the following:    Glucose, Bld 109 (*)    Calcium 8.8 (*)    All other components within normal limits  SEDIMENTATION RATE - Abnormal; Notable for the following:    Sed Rate 29 (*)    All other components within normal limits  C-REACTIVE PROTEIN    EKG  EKG Interpretation None        Radiology Ct Hip Right Wo Contrast  Result Date: 06/14/2016 CLINICAL DATA:  Right hip pain for 1 day, history of rheumatoid arthritis. Difficulty walking. EXAM: CT OF THE RIGHT HIP WITHOUT CONTRAST TECHNIQUE: Multidetector CT imaging of the right hip was performed according to the standard protocol. Multiplanar CT image reconstructions were also generated. COMPARISON:  None. FINDINGS: Bones/Joint/Cartilage Subtle articular surface irregularities along the superior acetabulum compatible with small erosions and several small geodes. Mild craniocaudad and anterior chondral thinning in the right hip as on image 50/4. There is a right hip joint effusion with possible right iliopsoas bursitis. Ligaments Suboptimally assessed by CT. Muscles and Tendons Mildly prominent but apparently symmetric where visualized piriformis musculature, without overt sciatic notch impingement. Soft tissues No impinging lesion along the sacral plexus. No obturator impingement  identified. IMPRESSION: 1. Right hip joint effusion with mild chondral thinning especially anteriorly in the hip joint, as well as subtle cortical irregularities in the superior acetabulum potentially from small erosions in the setting of rheumatoid arthropathy. No fracture or acute bony finding is identified. Electronically Signed   By: Van Clines M.D.   On: 06/14/2016 11:28   Dg Hip Unilat W Or Wo Pelvis 2-3 Views Right  Result Date: 06/14/2016 CLINICAL DATA:  Acute onset of severe lateral RIGHT hip pain upon awakening this morning, no known injury, history rheumatoid arthritis, hypertension EXAM: DG HIP (WITH OR WITHOUT PELVIS) 2-3V RIGHT COMPARISON:  None FINDINGS: Osseous mineralization normal. Hip and SI joint spaces symmetric and preserved. No acute fracture, dislocation, bone destruction. No radiographic soft tissue abnormalities identified. IMPRESSION: Normal exam. Electronically Signed   By: Lavonia Dana M.D.   On: 06/14/2016 09:51     Procedures Procedures (including critical care time)  Medications Ordered in ED Medications - No data to display   Initial Impression / Assessment and Plan / ED Course  I have reviewed the triage vital signs and the nursing notes.  Pertinent labs & imaging results that were available during my care of the patient were reviewed by me and considered in my medical decision making (see chart for details).  Final Clinical Impressions(s) / ED Diagnoses   {I have reviewed and evaluated the relevant imaging studies.  {I have reviewed the relevant previous healthcare records.  {I obtained HPI from historian. {Patient discussed with supervising physician.  ED Course:  Assessment: 45yM hx RA with right Hip pain that started yesterday. No trauma to area. No fevers. Patient X-Ray negative for obvious fracture or dislocation.  Due to abnormal exam, ordered CT for eval of joint infection as pt immunocompromised. CT showed Right hip joint effusion with mild chondral thinning especially anteriorly in the hip joint, as well as subtle cortical irregularities in the superior acetabulum potentially from small erosions in the setting of rheumatoid arthropathy. No acute fractures. ESR 29. CBC/BMP unremarkable. Consult to  Dr. Mardelle Matte on call at [facility]. Likely rheumatoid flare. Recommended DC Tamiflu. Restart methotrexate. Give steroids in ED as well as PO steroids and follow up to [place] with Dr Durward Fortes on call with [facility] as pt sees Dr. Estanislado Pandy for Rheumatology at [facility]. Agrees with plan. Will have patient call office in AM for appointment.  I have reviewed the [place] Controlled Substance Reporting System. Rx #10 Percocet as well as steroids. At time of discharge, Patient is in no acute distress. Vital Signs are stable. Patient is able to ambulate. Patient able to tolerate PO.    Disposition/Plan:  DC Home Additional Verbal discharge instructions given and  discussed with patient.  Pt Instructed to f/u with Ortho in the next week for evaluation and treatment of symptoms. Return precautions given Pt acknowledges and agrees with plan  Supervising Physician Francine Graven, DO  Final diagnoses:  Right hip pain    New Prescriptions New Prescriptions   No medications on file     Shary Decamp, PA-C 06/14/16 [place], DO 06/17/16 1737

## 2016-06-15 ENCOUNTER — Encounter: Payer: Self-pay | Admitting: Rheumatology

## 2016-06-15 ENCOUNTER — Ambulatory Visit (INDEPENDENT_AMBULATORY_CARE_PROVIDER_SITE_OTHER): Payer: BLUE CROSS/BLUE SHIELD | Admitting: Rheumatology

## 2016-06-15 VITALS — BP 125/66 | HR 81 | Resp 16 | Ht 74.0 in | Wt 219.0 lb

## 2016-06-15 DIAGNOSIS — M0579 Rheumatoid arthritis with rheumatoid factor of multiple sites without organ or systems involvement: Secondary | ICD-10-CM

## 2016-06-15 DIAGNOSIS — Z79899 Other long term (current) drug therapy: Secondary | ICD-10-CM | POA: Diagnosis not present

## 2016-06-15 DIAGNOSIS — M069 Rheumatoid arthritis, unspecified: Secondary | ICD-10-CM

## 2016-06-15 DIAGNOSIS — M25551 Pain in right hip: Secondary | ICD-10-CM

## 2016-06-15 LAB — CBC WITH DIFFERENTIAL/PLATELET
BASOS ABS: 0 {cells}/uL (ref 0–200)
Basophils Relative: 0 %
EOS PCT: 0 %
Eosinophils Absolute: 0 cells/uL — ABNORMAL LOW (ref 15–500)
HCT: 39.1 % (ref 38.5–50.0)
Hemoglobin: 13.4 g/dL (ref 13.2–17.1)
Lymphocytes Relative: 7 %
Lymphs Abs: 805 cells/uL — ABNORMAL LOW (ref 850–3900)
MCH: 31.5 pg (ref 27.0–33.0)
MCHC: 34.3 g/dL (ref 32.0–36.0)
MCV: 91.8 fL (ref 80.0–100.0)
MONOS PCT: 5 %
MPV: 8.9 fL (ref 7.5–12.5)
Monocytes Absolute: 575 cells/uL (ref 200–950)
NEUTROS ABS: 10120 {cells}/uL — AB (ref 1500–7800)
Neutrophils Relative %: 88 %
PLATELETS: 240 10*3/uL (ref 140–400)
RBC: 4.26 MIL/uL (ref 4.20–5.80)
RDW: 13.2 % (ref 11.0–15.0)
WBC: 11.5 10*3/uL — AB (ref 3.8–10.8)

## 2016-06-15 LAB — COMPLETE METABOLIC PANEL WITH GFR
ALT: 23 U/L (ref 9–46)
AST: 14 U/L (ref 10–40)
Albumin: 4.3 g/dL (ref 3.6–5.1)
Alkaline Phosphatase: 54 U/L (ref 40–115)
BUN: 17 mg/dL (ref 7–25)
CO2: 23 mmol/L (ref 20–31)
Calcium: 9.3 mg/dL (ref 8.6–10.3)
Chloride: 105 mmol/L (ref 98–110)
Creat: 0.89 mg/dL (ref 0.60–1.35)
GFR, Est African American: >89 mL/min (ref >=60)
GLUCOSE: 142 mg/dL — AB (ref 65–99)
POTASSIUM: 4.2 mmol/L (ref 3.5–5.3)
SODIUM: 138 mmol/L (ref 135–146)
TOTAL PROTEIN: 7.4 g/dL (ref 6.1–8.1)
Total Bilirubin: 0.4 mg/dL (ref 0.2–1.2)

## 2016-06-15 MED ORDER — PREDNISONE 5 MG PO TABS: ORAL_TABLET | ORAL | 0 refills | Status: DC

## 2016-06-15 NOTE — Progress Notes (Signed)
Office Visit Note  Patient: Bobby Kramer             Date of Birth: 07-06-1970           MRN: [id number]             PCP: Jana Half Referring: Jake Samples, PA* Visit Date: 06/15/2016 Occupation: _0 @    Subjective:  No chief complaint on file. History of rheumatoid arthritis and presenting today because of a flare recently  History of Present Illness: LENOARD Kramer is a 46 y.o. male  Last seen 06/01/2016 by myself.  Patient recently seen in the emergency department and was requested to follow-up with our office. He was seen in the ED yesterday 06/14/2016.  Patient has typed up this time Which I am reading off of his notes: #1: On 06/07/2016: Oldest daughter diagnosed with flu (not tested) and a test for strep was negative. His daughter did not have any fever. She was prescribed Tamiflu. Around that time patient began having congestion of his nose with drainage but not really feeling too sick.  #2: On 06/08/2016: Congestion with no fever and no consistent cough or pain  #3: 06/09/2016 patient had worse congestion but no fever.; No fever, no sore throat or pain.  #4: 06/10/2016 the nasal congestion began to affect the chest and started having significant chest pain as well as coughing up mucus; no fever no sore throat.  #5: On 06/11/2016, patient saw [facility] medical. They prescribed Tamiflu and then there were any give him Z-Pak if his condition worsened.; Tamiflu was started on January 15.  #6: 06/12/2016 patient continue Tamiflu, condition stable, went to work for about 3 hours.  #7: 06/13/2016 On that day he started noticing pain in the right hip in the morning and it was affecting his gait and he was unable to rise from a seated position secondary to pain. As the day wore on, patient got worse and by bedtime he was having unbearable pain to the right hip. Patient took Tamiflu. He works from 739 morning to 530pmafternoon. Around 1:30 in the morning,  patient had significant/severe pain to the right hip and needed to go to the hospital. He ended up going to [facility] about 8:00 in the morning. An x-ray was done which was negative of the right hip. A CAT scan was done which showed fluid in the pocket. Labs were done which showed inflammation markers. Please see lab report for full details. Orthopedist on-call was consulted and they diagnosed patient with severe rheumatoid flare. A steroid shot was given, prednisone oral medication, Percocets, advised to stop take Tamiflu, follow with rheumatologist immediately. Patient was able to get some sleep with the use of Percocet but still had pain. Patient describes the pain as excruciating with any kind of movement. 06/15/2016: Cough continues to be getting worse    Activities of Daily Living:  Patient reports morning stiffness for 30 minutes.   Patient Reports nocturnal pain.  Difficulty dressing/grooming: Reports Difficulty climbing stairs: Reports Difficulty getting out of chair: Reports Difficulty using hands for taps, buttons, cutlery, and/or writing: Denies   Review of Systems  Constitutional: Negative for fatigue.  HENT: Negative for mouth sores and mouth dryness.   Eyes: Negative for dryness.  Respiratory: Negative for shortness of breath.   Gastrointestinal: Negative for constipation and diarrhea.  Musculoskeletal: Positive for arthralgias (right hip pain w/ ROM) and joint pain (right hip pain w/ ROM). Negative for myalgias and myalgias.  Skin: Negative for sensitivity to sunlight.  Neurological: Negative for memory loss.  Psychiatric/Behavioral: Negative for sleep disturbance.    PMFS History:  Patient Active Problem List   Diagnosis Date Noted  . High risk medication use 05/31/2016  . History of hypertension 05/31/2016  . History of sleep apnea 05/31/2016  . HLA B27 (HLA B27 positive) 05/31/2016  . Trochanteric bursitis of both hips 05/31/2016  . OSA  (obstructive sleep apnea) 01/08/2016  . Overweight 01/08/2016  . Hyperlipidemia 01/08/2016  . Rheumatoid arthritis (HCC) 01/08/2016  . Cervicalgia 11/13/2013  . Sprain of neck 11/13/2013  . HTN (hypertension) 07/28/2013  . Mild obesity 07/28/2013    Past Medical History:  Diagnosis Date  . Hypertension   . Rheumatoid arthritis (H[facility])   . Seasonal allergies   . Sleep apnea    uses a cpap    No family history on file. Past Surgical History:  Procedure Laterality Date  . CARDIAC CATHETERIZATION  05/09/2012   EF 55% Normal Coronary Arteries  . DOPPLER ECHOCARDIOGRAPHY  09/18/2011   EF>55%  . EXCISION MORTON'S NEUROMA  03/06/2012   Procedure: EXCISION MORTON'S NEUROMA;  Surgeon: Toni Arthurs, MD;  Location: [facility];  Service: Orthopedics;  Laterality: Right;  EXCISION OF RIGHT THIRD WEB SPACE NEUROMA  HENDER RETRACTOR *[place] USED AS TOURNIQUET.  ON AT 5533.  OFF AT 0826.  Marland Kitchen GXT  09/18/2011  . LEFT HEART CATHETERIZATION WITH CORONARY ANGIOGRAM Bilateral 05/26/2012   Procedure: LEFT HEART CATHETERIZATION WITH CORONARY ANGIOGRAM;  Surgeon: Thurmon Fair, MD;  Location: [facility];  Service: Cardiovascular;  Laterality: Bilateral;  . SYNOVECTOMY  03/06/2012   Procedure: SYNOVECTOMY;  Surgeon: Toni Arthurs, MD;  Location: [facility];  Service: Orthopedics;  Laterality: Right;  SYNOVECTOMY OF THIRD AND FOURTH METATARSAL PHALANGEAL JOINT  . WISDOM TOOTH EXTRACTION     Social History   Social History Narrative  . No narrative on file     Objective: Vital Signs: BP 125/66 (BP Location: Left Arm, Patient Position: Sitting, Cuff Size: Normal)   Pulse 81   Resp 16   Ht 6\' 2"  (1.88 m)   Wt 219 lb (99.3 kg)   BMI 28.12 kg/m    Physical Exam  Constitutional: He is oriented to person, place, and time. He appears well-developed and well-nourished.  HENT:  Head: Normocephalic and atraumatic.  Eyes: Conjunctivae and EOM are normal. Pupils are equal,  round, and reactive to light.  Neck: Normal range of motion. Neck supple.  Cardiovascular: Normal rate, regular rhythm and normal heart sounds.  Exam reveals no gallop and no friction rub.   No murmur heard. Pulmonary/Chest: Effort normal and breath sounds normal. No respiratory distress. He has no wheezes. He has no rales. He exhibits no tenderness.  Abdominal: Soft. He exhibits no distension and no mass. There is no tenderness. There is no guarding.  Musculoskeletal: Normal range of motion. He exhibits tenderness (RIGHT HIP PAIN W/ INTERNAL ROTATION).  Lymphadenopathy:    He has no cervical adenopathy.  Neurological: He is alert and oriented to person, place, and time. He exhibits normal muscle tone. Coordination normal.  Skin: Skin is warm and dry. Capillary refill takes less than 2 seconds. No rash noted.  Psychiatric: He has a normal mood and affect. His behavior is normal. Judgment and thought content normal.  Vitals reviewed.    Musculoskeletal Exam:  Full range of motion of all joints except right hip with decreased range of motion internal rotation. Able to  do external rotation just fine. Grip strength is equal and strong bilaterally Fiber myalgia tender points are all absent  CDAI Exam: CDAI Homunculus Exam:   Tenderness:  RLE: acetabulofemoral  Joint Counts:  CDAI Tender Joint count: 0 CDAI Swollen Joint count: 0  Global Assessments:  Patient Global Assessment: 7 Provider Global Assessment: 7  CDAI Calculated Score: 14    Investigation: No additional findings. Admission on 06/14/2016, Discharged on 06/14/2016  Component Date Value Ref Range Status  . WBC 06/14/2016 7.4  4.0 - 10.5 K/uL Final  . RBC 06/14/2016 4.08* 4.22 - 5.81 MIL/uL Final  . Hemoglobin 06/14/2016 12.6* 13.0 - 17.0 g/dL Final  . HCT 06/14/2016 37.6* 39.0 - 52.0 % Final  . MCV 06/14/2016 92.2  78.0 - 100.0 fL Final  . MCH 06/14/2016 30.9  26.0 - 34.0 pg Final  . MCHC 06/14/2016 33.5  30.0 -  36.0 g/dL Final  . RDW 06/14/2016 11.8  11.5 - 15.5 % Final  . Platelets 06/14/2016 174  150 - 400 K/uL Final  . Sodium 06/14/2016 137  135 - 145 mmol/L Final  . Potassium 06/14/2016 4.0  3.5 - 5.1 mmol/L Final  . Chloride 06/14/2016 106  101 - 111 mmol/L Final  . CO2 06/14/2016 23  22 - 32 mmol/L Final  . Glucose, Bld 06/14/2016 109* 65 - 99 mg/dL Final  . BUN 06/14/2016 15  6 - 20 mg/dL Final  . Creatinine, Ser 06/14/2016 0.83  0.61 - 1.24 mg/dL Final  . Calcium 06/14/2016 8.8* 8.9 - 10.3 mg/dL Final  . GFR calc non Af Amer 06/14/2016 >60  >60 mL/min Final  . GFR calc Af Amer 06/14/2016 >60  >60 mL/min Final   Comment: (NOTE) The eGFR has been calculated using the CKD EPI equation. This calculation has not been validated in all clinical situations. eGFR's persistently <60 mL/min signify possible Chronic Kidney Disease.   . Anion gap 06/14/2016 8  5 - 15 Final  . CRP 06/14/2016 3.7* <1.0 mg/dL Final  . Sed Rate 06/14/2016 29* 0 - 16 mm/hr Final     Imaging: Ct Hip Right Wo Contrast  Result Date: 06/14/2016 CLINICAL DATA:  Right hip pain for 1 day, history of rheumatoid arthritis. Difficulty walking. EXAM: CT OF THE RIGHT HIP WITHOUT CONTRAST TECHNIQUE: Multidetector CT imaging of the right hip was performed according to the standard protocol. Multiplanar CT image reconstructions were also generated. COMPARISON:  None. FINDINGS: Bones/Joint/Cartilage Subtle articular surface irregularities along the superior acetabulum compatible with small erosions and several small geodes. Mild craniocaudad and anterior chondral thinning in the right hip as on image 50/4. There is a right hip joint effusion with possible right iliopsoas bursitis. Ligaments Suboptimally assessed by CT. Muscles and Tendons Mildly prominent but apparently symmetric where visualized piriformis musculature, without overt sciatic notch impingement. Soft tissues No impinging lesion along the sacral plexus. No obturator  impingement identified. IMPRESSION: 1. Right hip joint effusion with mild chondral thinning especially anteriorly in the hip joint, as well as subtle cortical irregularities in the superior acetabulum potentially from small erosions in the setting of rheumatoid arthropathy. No fracture or acute bony finding is identified. Electronically Signed   By: Van Clines M.D.   On: 06/14/2016 11:28   Dg Hip Unilat W Or Wo Pelvis 2-3 Views Right  Result Date: 06/14/2016 CLINICAL DATA:  Acute onset of severe lateral RIGHT hip pain upon awakening this morning, no known injury, history rheumatoid arthritis, hypertension EXAM: DG HIP (WITH OR WITHOUT  PELVIS) 2-3V RIGHT COMPARISON:  None FINDINGS: Osseous mineralization normal. Hip and SI joint spaces symmetric and preserved. No acute fracture, dislocation, bone destruction. No radiographic soft tissue abnormalities identified. IMPRESSION: Normal exam. Electronically Signed   By: Lavonia Dana M.D.   On: 06/14/2016 09:51    Speciality Comments: No specialty comments available.    Procedures:  No procedures performed Allergies: Patient has no known allergies.   Assessment / Plan:     Visit Diagnoses: Rheumatoid arthritis involving multiple sites with positive rheumatoid factor (HCC)  Rheumatoid arthritis flare (HCC) - Plan: Sedimentation rate, C-reactive protein, CBC with Differential/Platelet, COMPLETE METABOLIC PANEL WITH GFR  Pain in right hip - Plan: Sedimentation rate, C-reactive protein, CBC with Differential/Platelet, COMPLETE METABOLIC PANEL WITH GFR  High risk medications (not anticoagulants) long-term use - Plan: Sedimentation rate, C-reactive protein, CBC with Differential/Platelet, COMPLETE METABOLIC PANEL WITH GFR   Plan: #1: Patient is having flare of rheumatoid arthritis of the right hip. Severe flare. [facility] ER yesterday. Please see history of present illness for full details Was given prednisone injection as well as  prednisone taper by the ER. I've readjust the taper as follows:With the 20 mg tablet Yorty have and the 5 mg I'm going to give you: 40 mg 2 days, 30 mg 2 days, 25 mg 2 days, 20 mg 2 days, 15 mg 2 days, 10 mg 2 days, 5 mg 2 days, stop take it first thing in the morning. Even if feeling better, finished taper as prescribed  #2: Continue Cimzia #3: CBC with differential CMP with GFR sedimentation rate and C-reactive protein today #4: Patient was advised to have rest and be out of work on Monday and Tuesday and I'll follow-up with him on Wednesday. #5: He is to call our office if he has any flares. I'm on call #6: Out of work note for Monday and Tuesday. #7: Patient is to return to our office on Wednesday for work in short appointment to make sure things are moving in the right direction and he's improving.   Orders: Orders Placed This Encounter  Procedures  . Sedimentation rate  . C-reactive protein  . CBC with Differential/Platelet  . COMPLETE METABOLIC PANEL WITH GFR   No orders of the defined types were placed in this encounter.   Face-to-face time spent with patient was 40 minutes. 50% of time was spent in counseling and coordination of care.  Follow-Up Instructions: No Follow-up on file.   Eliezer Lofts, PA-C  Note - This record has been created using [employer].  Chart creation errors have been sought, but may not always  have been located. Such creation errors do not reflect on  the standard of medical care.

## 2016-06-16 LAB — SEDIMENTATION RATE: Sed Rate: 42 mm/hr — ABNORMAL HIGH (ref 0–15)

## 2016-06-18 LAB — C-REACTIVE PROTEIN: CRP: 141.9 mg/L — AB (ref <8.0)

## 2016-06-20 ENCOUNTER — Ambulatory Visit (INDEPENDENT_AMBULATORY_CARE_PROVIDER_SITE_OTHER): Payer: BLUE CROSS/BLUE SHIELD | Admitting: Rheumatology

## 2016-06-20 ENCOUNTER — Encounter: Payer: Self-pay | Admitting: Rheumatology

## 2016-06-20 VITALS — BP 128/72 | HR 78 | Resp 16 | Wt 218.0 lb

## 2016-06-20 DIAGNOSIS — M0579 Rheumatoid arthritis with rheumatoid factor of multiple sites without organ or systems involvement: Secondary | ICD-10-CM | POA: Diagnosis not present

## 2016-06-20 DIAGNOSIS — M069 Rheumatoid arthritis, unspecified: Secondary | ICD-10-CM | POA: Diagnosis not present

## 2016-06-20 DIAGNOSIS — Z79899 Other long term (current) drug therapy: Secondary | ICD-10-CM | POA: Diagnosis not present

## 2016-06-20 DIAGNOSIS — M25551 Pain in right hip: Secondary | ICD-10-CM | POA: Diagnosis not present

## 2016-06-20 MED ORDER — PREDNISONE 5 MG PO TABS: ORAL_TABLET | ORAL | 0 refills | Status: DC

## 2016-06-20 MED ORDER — "TUBERCULIN-ALLERGY SYRINGES 27G X 1/2"" 1 ML KIT": 1.0000 | PACK | 4 refills | Status: DC

## 2016-06-20 MED ORDER — METHOTREXATE SODIUM CHEMO INJECTION 50 MG/2ML: 20.0000 mg | INTRAMUSCULAR | 0 refills | Status: DC

## 2016-06-20 NOTE — Progress Notes (Signed)
Office Visit Note  Patient: Bobby Kramer             Date of Birth: 1971-02-28           MRN: [id number]             PCP: Jana Half Referring: Jake Samples, PA* Visit Date: 06/20/2016 Occupation: _0 @    Subjective:  Follow-up (improving) Please see notes from 06/15/2016 visit. Patient was seen in the emergency department secondary to severe hip pain. He was diagnosed with flare of his rheumatoid arthritis affecting his hip. Was placed on prednisone. He also was suffering from the flu at that time. And he recalls taking 1 dose of an antiviral flu medicine and then he flared.   History of Present Illness: Bobby Kramer is a 46 y.o. male  Last seen 06/15/2016.  Patient is doing 50% better since the last visit on Friday (about 4 days ago) He is taking his prednisone taper as prescribed. Please see the note from this past Friday for the prednisone taper schedule. He has about 9 days left of his taper.  He took a Cimzia this weekend He takes his methotrexate as prescribed at 8 pills every week He is taking his folic acid 2 mg every day. Note the patient states of the Cimzia does not seem to be working well for him and Humira actually worked better but unfortunately he did discontinue that.  Senna fact that he's taken the Cimzia now for about 4 months (since August/September 2017 until now and failed Humira in the past, he may benefit from switching therapy. Below is the history of Humira and Cimzia  PREVIOUS HPI from September 13, 2015 visit ===> When patient was on Humira and methotrexate, this is the conversation he had with Dr. Estanislado Pandy on 09/13/2015 visit: He is on high risk prescriptions, taking methotrexate and Humira which are working very well for him although he is not quite satisfied and is quite anxious about the progression of his arthritis.  On 12/07/2015 visit, he had an ultrasound done. ===> : Rheumatoid arthritis with mild inflammation in  the above-mentioned MTP joints. I had a detailed discussion with the patient. He has been on Humira since October now. He believes that it is not working as well and he would like to switch medications.  Note that we had considered applying for Enbrel but that was denied from his insurance. As a result Cimzia was approved and we started him on Cimzia. He received his first dose on 01/13/2016.  N.B.: The next medication that we can use for the patient is SIMPONI if Cimzia fails  CONSIDER Orencia subcutaneous infusion; Morrie Sheldon; Actemra injection or infusion.      Activities of Daily Living:  Patient reports morning stiffness for 60 minutes.   Patient Reports nocturnal pain.  Difficulty dressing/grooming: Reports Difficulty climbing stairs: Reports Difficulty getting out of chair: Reports Difficulty using hands for taps, buttons, cutlery, and/or writing: Reports   Review of Systems  Constitutional: Negative for fatigue.  HENT: Negative for mouth sores and mouth dryness.   Eyes: Negative for dryness.  Respiratory: Negative for shortness of breath.   Gastrointestinal: Negative for constipation and diarrhea.  Musculoskeletal: Negative for myalgias and myalgias.  Skin: Negative for sensitivity to sunlight.  Neurological: Negative for memory loss.  Psychiatric/Behavioral: Negative for sleep disturbance.    PMFS History:  Patient Active Problem List   Diagnosis Date Noted  . Pain in right hip 06/20/2016  . High  risk medication use 05/31/2016  . History of hypertension 05/31/2016  . History of sleep apnea 05/31/2016  . HLA B27 (HLA B27 positive) 05/31/2016  . Trochanteric bursitis of both hips 05/31/2016  . OSA (obstructive sleep apnea) 01/08/2016  . Overweight 01/08/2016  . Hyperlipidemia 01/08/2016  . Rheumatoid arthritis ([place]) 01/08/2016  . Cervicalgia 11/13/2013  . Sprain of neck 11/13/2013  . HTN (hypertension) 07/28/2013  . Mild obesity 07/28/2013    Past Medical  History:  Diagnosis Date  . Hypertension   . Rheumatoid arthritis ([place])   . Seasonal allergies   . Sleep apnea    uses a cpap    No family history on file. Past Surgical History:  Procedure Laterality Date  . CARDIAC CATHETERIZATION  05/09/2012   EF 55% Normal Coronary Arteries  . DOPPLER ECHOCARDIOGRAPHY  09/18/2011   EF>55%  . EXCISION MORTON'S NEUROMA  03/06/2012   Procedure: EXCISION MORTON'S NEUROMA;  Surgeon: Wylene Simmer, MD;  Location: [facility];  Service: Orthopedics;  Laterality: Right;  EXCISION OF RIGHT THIRD WEB SPACE NEUROMA  HENDER RETRACTOR *[place] USED AS TOURNIQUET.  ON AT 4098.  OFF AT 0826.  Marland Kitchen GXT  09/18/2011  . LEFT HEART CATHETERIZATION WITH CORONARY ANGIOGRAM Bilateral 05/26/2012   Procedure: LEFT HEART CATHETERIZATION WITH CORONARY ANGIOGRAM;  Surgeon: Sanda Klein, MD;  Location: [facility];  Service: Cardiovascular;  Laterality: Bilateral;  . SYNOVECTOMY  03/06/2012   Procedure: SYNOVECTOMY;  Surgeon: Wylene Simmer, MD;  Location: [facility];  Service: Orthopedics;  Laterality: Right;  SYNOVECTOMY OF THIRD AND FOURTH METATARSAL PHALANGEAL JOINT  . WISDOM TOOTH EXTRACTION     Social History   Social History Narrative  . No narrative on file     Objective: Vital Signs: BP 128/72   Pulse 78   Resp 16   Wt 218 lb (98.9 kg)   BMI 27.99 kg/m    Physical Exam  Constitutional: He is oriented to person, place, and time. He appears well-developed and well-nourished.  HENT:  Head: Normocephalic and atraumatic.  Eyes: Conjunctivae and EOM are normal. Pupils are equal, round, and reactive to light.  Neck: Normal range of motion. Neck supple.  Cardiovascular: Normal rate, regular rhythm and normal heart sounds.  Exam reveals no gallop and no friction rub.   No murmur heard. Pulmonary/Chest: Effort normal and breath sounds normal. No respiratory distress. He has no wheezes. He has no rales. He exhibits no tenderness.    Abdominal: Soft. He exhibits no distension and no mass. There is no tenderness. There is no guarding.  Musculoskeletal: Normal range of motion.  Lymphadenopathy:    He has no cervical adenopathy.  Neurological: He is alert and oriented to person, place, and time. He exhibits normal muscle tone. Coordination normal.  Skin: Skin is warm and dry. Capillary refill takes less than 2 seconds. No rash noted.  Psychiatric: He has a normal mood and affect. His behavior is normal. Judgment and thought content normal.  Vitals reviewed.    Musculoskeletal Exam:  Full range of motion of all joints Grip strength is equal and strong bilaterally Fiber myalgia tender points are all absent  CDAI Exam: CDAI Homunculus Exam:   Joint Counts:  CDAI Tender Joint count: 0 CDAI Swollen Joint count: 0   No synovitis on examination of the hands. Note that the ultrasound done by Dr. Estanislado Pandy in the past showed erosive changes in the feet. It's important to note that he's never really had much trouble in  his hands or wrists. Grip strength is equal and strong bilaterally Fiber myalgia tender points are all absent  Investigation: No additional findings.  Office Visit on 06/15/2016  Component Date Value Ref Range Status  . Sed Rate 06/15/2016 42* 0 - 15 mm/hr Final  . CRP 06/15/2016 141.9* <8.0 mg/L Final  . WBC 06/15/2016 11.5* 3.8 - 10.8 K/uL Final  . RBC 06/15/2016 4.26  4.20 - 5.80 MIL/uL Final  . Hemoglobin 06/15/2016 13.4  13.2 - 17.1 g/dL Final  . HCT 06/15/2016 39.1  38.5 - 50.0 % Final  . MCV 06/15/2016 91.8  80.0 - 100.0 fL Final  . MCH 06/15/2016 31.5  27.0 - 33.0 pg Final  . MCHC 06/15/2016 34.3  32.0 - 36.0 g/dL Final  . RDW 06/15/2016 13.2  11.0 - 15.0 % Final  . Platelets 06/15/2016 240  140 - 400 K/uL Final  . MPV 06/15/2016 8.9  7.5 - 12.5 fL Final  . Neutro Abs 06/15/2016 10120* 1,500 - 7,800 cells/uL Final  . Lymphs Abs 06/15/2016 805* 850 - 3,900 cells/uL Final  . Monocytes  Absolute 06/15/2016 575  200 - 950 cells/uL Final  . Eosinophils Absolute 06/15/2016 0* 15 - 500 cells/uL Final  . Basophils Absolute 06/15/2016 0  0 - 200 cells/uL Final  . Neutrophils Relative % 06/15/2016 88  % Final  . Lymphocytes Relative 06/15/2016 7  % Final  . Monocytes Relative 06/15/2016 5  % Final  . Eosinophils Relative 06/15/2016 0  % Final  . Basophils Relative 06/15/2016 0  % Final  . Smear Review 06/15/2016 Criteria for review not met   Final  . Sodium 06/15/2016 138  135 - 146 mmol/L Final  . Potassium 06/15/2016 4.2  3.5 - 5.3 mmol/L Final  . Chloride 06/15/2016 105  98 - 110 mmol/L Final  . CO2 06/15/2016 23  20 - 31 mmol/L Final  . Glucose, Bld 06/15/2016 142* 65 - 99 mg/dL Final  . BUN 06/15/2016 17  7 - 25 mg/dL Final  . Creat 06/15/2016 0.89  0.60 - 1.35 mg/dL Final  . Total Bilirubin 06/15/2016 0.4  0.2 - 1.2 mg/dL Final  . Alkaline Phosphatase 06/15/2016 54  40 - 115 U/L Final  . AST 06/15/2016 14  10 - 40 U/L Final  . ALT 06/15/2016 23  9 - 46 U/L Final  . Total Protein 06/15/2016 7.4  6.1 - 8.1 g/dL Final  . Albumin 06/15/2016 4.3  3.6 - 5.1 g/dL Final  . Calcium 06/15/2016 9.3  8.6 - 10.3 mg/dL Final  . GFR, Est African American 06/15/2016 >89  >=60 mL/min Final  . GFR, Est Non African American 06/15/2016 >89  >=60 mL/min Final  Admission on 06/14/2016, Discharged on 06/14/2016  Component Date Value Ref Range Status  . WBC 06/14/2016 7.4  4.0 - 10.5 K/uL Final  . RBC 06/14/2016 4.08* 4.22 - 5.81 MIL/uL Final  . Hemoglobin 06/14/2016 12.6* 13.0 - 17.0 g/dL Final  . HCT 06/14/2016 37.6* 39.0 - 52.0 % Final  . MCV 06/14/2016 92.2  78.0 - 100.0 fL Final  . MCH 06/14/2016 30.9  26.0 - 34.0 pg Final  . MCHC 06/14/2016 33.5  30.0 - 36.0 g/dL Final  . RDW 06/14/2016 11.8  11.5 - 15.5 % Final  . Platelets 06/14/2016 174  150 - 400 K/uL Final  . Sodium 06/14/2016 137  135 - 145 mmol/L Final  . Potassium 06/14/2016 4.0  3.5 - 5.1 mmol/L Final  . Chloride  06/14/2016 106  101 - 111 mmol/L Final  . CO2 06/14/2016 23  22 - 32 mmol/L Final  . Glucose, Bld 06/14/2016 109* 65 - 99 mg/dL Final  . BUN 06/14/2016 15  6 - 20 mg/dL Final  . Creatinine, Ser 06/14/2016 0.83  0.61 - 1.24 mg/dL Final  . Calcium 06/14/2016 8.8* 8.9 - 10.3 mg/dL Final  . GFR calc non Af Amer 06/14/2016 >60  >60 mL/min Final  . GFR calc Af Amer 06/14/2016 >60  >60 mL/min Final   Comment: (NOTE) The eGFR has been calculated using the CKD EPI equation. This calculation has not been validated in all clinical situations. eGFR's persistently <60 mL/min signify possible Chronic Kidney Disease.   . Anion gap 06/14/2016 8  5 - 15 Final  . CRP 06/14/2016 3.7* <1.0 mg/dL Final  . Sed Rate 06/14/2016 29* 0 - 16 mm/hr Final     Imaging: Ct Hip Right Wo Contrast  Result Date: 06/14/2016 CLINICAL DATA:  Right hip pain for 1 day, history of rheumatoid arthritis. Difficulty walking. EXAM: CT OF THE RIGHT HIP WITHOUT CONTRAST TECHNIQUE: Multidetector CT imaging of the right hip was performed according to the standard protocol. Multiplanar CT image reconstructions were also generated. COMPARISON:  None. FINDINGS: Bones/Joint/Cartilage Subtle articular surface irregularities along the superior acetabulum compatible with small erosions and several small geodes. Mild craniocaudad and anterior chondral thinning in the right hip as on image 50/4. There is a right hip joint effusion with possible right iliopsoas bursitis. Ligaments Suboptimally assessed by CT. Muscles and Tendons Mildly prominent but apparently symmetric where visualized piriformis musculature, without overt sciatic notch impingement. Soft tissues No impinging lesion along the sacral plexus. No obturator impingement identified. IMPRESSION: 1. Right hip joint effusion with mild chondral thinning especially anteriorly in the hip joint, as well as subtle cortical irregularities in the superior acetabulum potentially from small erosions in  the setting of rheumatoid arthropathy. No fracture or acute bony finding is identified. Electronically Signed   By: Van Clines M.D.   On: 06/14/2016 11:28   Dg Hip Unilat W Or Wo Pelvis 2-3 Views Right  Result Date: 06/14/2016 CLINICAL DATA:  Acute onset of severe lateral RIGHT hip pain upon awakening this morning, no known injury, history rheumatoid arthritis, hypertension EXAM: DG HIP (WITH OR WITHOUT PELVIS) 2-3V RIGHT COMPARISON:  None FINDINGS: Osseous mineralization normal. Hip and SI joint spaces symmetric and preserved. No acute fracture, dislocation, bone destruction. No radiographic soft tissue abnormalities identified. IMPRESSION: Normal exam. Electronically Signed   By: Lavonia Dana M.D.   On: 06/14/2016 09:51    Speciality Comments: No specialty comments available.    Procedures:  No procedures performed Allergies: Patient has no known allergies.   Assessment / Plan:     Visit Diagnoses: Rheumatoid arthritis involving multiple sites with positive rheumatoid factor ([place])  High risk medication use - 06/20/2016: Failed Humira; failed Cimzia; will try Xeljanz 11 mg if approved; continue methotrexate use injectable instead  Pain in right hip  Rheumatoid arthritis flare ([place])    Plan: #1: After discussing with the patient, it was decided to move forward with Morrie Sheldon. 54m XR (the other 2 medications were higher tier than the XSibley.  #2: Continue methotrexate but we will do injectable; 0.8 ML's every week. We will write a new prescription for patient.  #3: We can change the prednisone taper to the following 20 mg for 7 days 15 mg for 14 days 10 mg for 14 days 7.5 mg for 14 days 5  mg for 14 days 2.5 mg 14 days Then stop  #4: The above prednisone taper should bridge him until he is able to start [place]. 5: Patient is still limping on his right hip. He is also stating that his feet are hurting. His current flare of his hip pain as well as his feet pain signify  that Cimzia is not working well for the patient. Note that he also failed Humira. As a result he is not doing well with TNF and we plan to discontinue that. He may benefit from [place] Barnabas Lister inhibitor). We will apply for Xeljanz.  #5: Fasting CBC with differential, CMP with GFR, every 2-3 months for his current symptoms injections as scheduled.  #6:Return to clinic late February as scheduled for follow-up Dr. Estanislado Pandy  #7: Dr. Koleen Nimrod has found patient's lipid levels done back in August 2017 so he will not need that prior to starting his Morrie Sheldon.  #8: Injectable methotrexate prescription written for the patient. (Stop methotrexate pills).: Continue folic acid 2 mg every day   Orders: No orders of the defined types were placed in this encounter.  No orders of the defined types were placed in this encounter.   Face-to-face time spent with patient was 30 minutes. 50% of time was spent in counseling and coordination of care.  Follow-Up Instructions: Return in about 4 weeks (around 07/18/2016).   Aurielle Slingerland, PA-C I examined Pt. Today. He has lot of discomfort with range of motion of his right hip joint. He continues to limp. He apprears to be having more flares on Cimzia. We had detailed discussion today. Different tt options and their  side effects were discussed. We will apply for Morrie Sheldon and will give Prednisone taper as described above.  I examined and evaluated the patient with Eliezer Lofts PA. The plan of care was discussed as noted above.  Bo Merino, MD Note - This record has been created using Editor, commissioning.  Chart creation errors have been sought, but may not always  have been located. Such creation errors do not reflect on  the standard of medical care.

## 2016-06-20 NOTE — Patient Instructions (Signed)
Tofacitinib extended-release tablets What is this medicine? TOFACITINIB (TOE fa SYE it nib) is a medicine that works on the immune system. This medicine is used to treat rheumatoid arthritis. COMMON BRAND NAME(S): Xeljanz XR What should I tell my health care provider before I take this medicine? They need to know if you have any of these conditions: -cancer -diabetes -high cholesterol -immune system problems -infection (especially a virus infection such as chickenpox, cold sores, or herpes) -kidney disease -liver disease -low blood counts, like low white cell, platelet, or red cell counts -stomach or intestine problems -an unusual or allergic reaction to tofacitinib, other medicines, foods, dyes, or preservatives -pregnant or trying to get pregnant -breast-feeding How should I use this medicine? Take this medicine by mouth with a glass of water. Follow the directions on the prescription label. Do not cut, crush or chew this medicine. You can take it with or without food. If it upsets your stomach, take it with food. Take your medicine at regular intervals. Do not take it more often than directed. Do not stop taking except on your doctor's advice. A special MedGuide will be given to you by the pharmacist with each prescription and refill. Be sure to read this information carefully each time. Talk to your pediatrician regarding the use of this medicine in children. Special care may be needed. What if I miss a dose? If you miss a dose, take it as soon as you can. If it is almost time for your next dose, take only that dose. Do not take double or extra doses. What may interact with this medicine? Do not take this medicine with any of the following medications: -abatacept -adalimumab -anakinra -azathioprine -certolizumab -cyclosporine -etanercept -golimumab -infliximab -live vaccines -rituximab -tacrolimus -tocilizumab This medicine may also interact with the following  medications: -fluconazole -ketoconazole -rifampin -vaccines What should I watch for while using this medicine? Tell your doctor or healthcare professional if your symptoms do not start to get better or if they get worse. The tablet shell of this medicine does not dissolve. This is normal. The tablet shell may appear whole in the stool. This is not a cause for concern. Avoid taking products that contain aspirin, acetaminophen, ibuprofen, naproxen, or ketoprofen unless instructed by your doctor. These medicines may hide a fever. Call your doctor or health care professional for advice if you get a fever, chills or sore throat, or other symptoms of a cold or flu. Do not treat yourself. This drug decreases your body's ability to fight infections. Try to avoid being around people who are sick. What side effects may I notice from receiving this medicine? Side effects that you should report to your doctor or health care professional as soon as possible: -allergic reactions like skin rash, itching or hives, swelling of the face, lips, or tongue -breathing problems -dizziness -signs of infection - fever or chills, cough, sore throat, pain or trouble passing urine -signs and symptoms of liver injury like dark yellow or brown urine; general ill feeling or flu-like symptoms; light-colored stools; loss of appetite; nausea; right upper belly pain; unusually weak or tired; yellowing of the eyes or skin -stomach pain or a sudden change in bowel habits -unusually weak or tired Side effects that usually do not require medical attention (report to your doctor or health care professional if they continue or are bothersome): -diarrhea -headache -muscle aches -runny nose -sinus trouble Where should I keep my medicine? Keep out of the reach of children. Store between 20 and   25 degrees C (68 and 77 degrees F). Throw away any unused medicine after the expiration date.  2017 Elsevier/Gold Standard (2015-06-16  07:34:33)  

## 2016-06-20 NOTE — Progress Notes (Signed)
Pharmacy Note  Subjective: Patient presents today to the [name] to see Bobby Kramer/Bobby Kramer.  Patient is being switched from [place] to Bobby Kramer.  Patient seen by the pharmacist for counseling on high risk medication.    Objective: TB test: negative (02/07/16) Hepatitis panel: negative (11/17/14) Lipid panel: 01/10/16 Total cholesterol 202, HDL 49, LDL 120, Triglycerides 164  CMP     Component Value Date/Time   NA 138 06/15/2016 1503   K 4.2 06/15/2016 1503   CL 105 06/15/2016 1503   CO2 23 06/15/2016 1503   GLUCOSE 142 (H) 06/15/2016 1503   BUN 17 06/15/2016 1503   CREATININE 0.89 06/15/2016 1503   CALCIUM 9.3 06/15/2016 1503   PROT 7.4 06/15/2016 1503   ALBUMIN 4.3 06/15/2016 1503   AST 14 06/15/2016 1503   ALT 23 06/15/2016 1503   ALKPHOS 54 06/15/2016 1503   BILITOT 0.4 06/15/2016 1503   GFRNONAA >89 06/15/2016 1503   GFRAA >89 06/15/2016 1503    CBC    Component Value Date/Time   WBC 11.5 (H) 06/15/2016 1503   RBC 4.26 06/15/2016 1503   HGB 13.4 06/15/2016 1503   HCT 39.1 06/15/2016 1503   PLT 240 06/15/2016 1503   MCV 91.8 06/15/2016 1503   MCH 31.5 06/15/2016 1503   MCHC 34.3 06/15/2016 1503   RDW 13.2 06/15/2016 1503   LYMPHSABS 805 (L) 06/15/2016 1503   MONOABS 575 06/15/2016 1503   EOSABS 0 (L) 06/15/2016 1503   BASOSABS 0 06/15/2016 1503    Assessment/Plan: Counseled patient that Bobby Kramer is a JAK inhibitor indicated for Rheumatoid Arthritis.  Counseled patient on purpose, proper use, and adverse effects of Xeljanz.  Counseled patient that medication is prescribed to take 11 mg daily.  Reviewed the most common adverse effects including infection, diarrhea, headaches.  Also reviewed rare adverse effects such as bowel injury and the need to contact us if she develops stomach pain during treatment.  Reviewed with patient that there is the possibility of an increased risk of malignancy but it is not well understood if this increased risk is  due to the medication or the disease state.  Discussed importance of routine laboratory monitoring while on Xeljanz including a lipid panel 2 months after initiation.  Provided patient with medication education material and answered all questions.  Patient consented to [place].  Will upload consent into his chart.  Patient's last dose of Cimzia was 06/17/16.  He is aware that Bobby Kramer cannot be initiated until one month after his last Cimzia injection.    Patient is also being switched from methotrexate tablets to methotrexate subcutaneous injections.  Educated patient on how to use a vial and syringe and reviewed injection technique with patient.  Provided patient on educational material regarding injection technique and storage of methotrexate.     Bobby Kramer, Pharm.D., BCPS, CPP Clinical Pharmacist Pager: [phone] Phone: [phone] 06/20/2016 4:26 PM

## 2016-06-21 ENCOUNTER — Encounter: Payer: Self-pay | Admitting: Pharmacist

## 2016-06-21 ENCOUNTER — Telehealth: Payer: Self-pay

## 2016-06-21 NOTE — Telephone Encounter (Signed)
Called patient to gather his RX information. It is not listed on his BCBS card.  [facility]  Bin: [id number] PcnMarletta Lor ID: [id number] Group: [id number]   He also states that he was seen by Mr. Leane Call yesterday and he prescribed methotrexate and prednisone. When he arrived at the pharmacy, he only had methotrexate. I checked his chart and it appears that prednisone was sent to [facility] specialty. I informed him that we would have the rx sent to [facility] in [place] (same pharmacy the methotrexate was sent to).  Sue Lush, can you make sure that the RX for methotrexate is sent to his pharmacy in [place] and the one from [facility] is cancelled?   Camyah Pultz, [place], CPhT

## 2016-06-21 NOTE — Telephone Encounter (Signed)
Prescription was faxed to [facility] yesterday afternoon. Prescription re faxed again this morning. Patient advised.

## 2016-06-21 NOTE — Progress Notes (Signed)
Received denial of Bobby Kramer XR from [employer].  Medication was denied for medical necessity.  Medication authorization requires the following: Submission of medical records documenting all of the following: Diagnosis of moderately to severely active rheumatoid arthritis AND medication is prescribed by a rheumatology doctor AND patient has failure, contraindication, or intolerance to a 3 month trial of one of the following conventional DMARDs (methotrexate, leflunomide, hydroxychloroquine, or sulfasalazine) AND have a history of failure, contraindication or intolerance to two of the following (Cimzia, Humira, and Simponi).  Will submit records to patient's insurance company.

## 2016-06-22 NOTE — Progress Notes (Signed)
Received a fax of approval from [employer] regarding Bobby Kramer's Rx for [employer]. He has been approved until 06/21/17.   Case number: [id number] Phone: [phone]  Will send document to scan center.   Terena Bohan, [place], CPhT

## 2016-06-22 NOTE — Progress Notes (Signed)
I called patient and informed him that Harriette Ohara was approved.  Patient just took Cimzia on 06/17/16 and will not be able to start Xeljanz until 07/18/16.  Advised patient we will send in [place] prescription the week before he is due to start the medication.  Patient uses Child psychotherapist.  Patient voiced understanding.     Lilla Shook, Pharm.D., BCPS, CPP Clinical Pharmacist Pager: [phone] Phone: [phone] 06/22/2016 1:26 PM

## 2016-06-27 DIAGNOSIS — B079 Viral wart, unspecified: Secondary | ICD-10-CM | POA: Diagnosis not present

## 2016-06-27 DIAGNOSIS — D1800 Hemangioma unspecified site: Secondary | ICD-10-CM | POA: Diagnosis not present

## 2016-06-27 DIAGNOSIS — L570 Actinic keratosis: Secondary | ICD-10-CM | POA: Diagnosis not present

## 2016-07-03 ENCOUNTER — Telehealth: Payer: Self-pay | Admitting: Rheumatology

## 2016-07-03 DIAGNOSIS — Z7189 Other specified counseling: Secondary | ICD-10-CM | POA: Diagnosis not present

## 2016-07-03 NOTE — Telephone Encounter (Signed)
Please let [facility] know which pneumonia vaccine that is being prescribed for patient.

## 2016-07-03 NOTE — Telephone Encounter (Signed)
Pharmacist advised we want him to receive the Prevnar 13.

## 2016-07-03 NOTE — Telephone Encounter (Signed)
Patient is requesting the rx for the pneumonia vaccine to be sent to [facility] pharmacy so they can obtain an authorization from his insurance company. Patient states he was told by Dr. Corliss Skains at his last visit to get the pneumonia vaccine. Patient is requesting this be done today please.

## 2016-07-03 NOTE — Telephone Encounter (Signed)
Prescription for Pneumonia vaccine faxed to [facility].

## 2016-07-10 ENCOUNTER — Other Ambulatory Visit: Payer: Self-pay | Admitting: Rheumatology

## 2016-07-10 NOTE — Progress Notes (Signed)
Office Visit Note  Patient: Bobby Kramer             Date of Birth: 1970/11/08           MRN: [id number]             PCP: Jana Half Referring: Jake Samples, PA* Visit Date: 07/19/2016 Occupation: _0 @    Subjective:  Joint pain   History of Present Illness: Bobby Kramer is a 46 y.o. male with history of rheumatoid arthritis. According to patient she's been off Cimzia for a month now. He's been taking methotrexate 0.8 ML subcutaneously per week. He  started his first dose of Xeljanz 11 mg X are today. He is still on tapering dose of prednisone currently taking 10 mg by mouth daily. He denies any joint swelling. He'll be tapering his prednisone gradually every 2 weeks.  Activities of Daily Living:  Patient reports morning stiffness for  minute.   Patient Denies nocturnal pain.  Difficulty dressing/grooming: Denies Difficulty climbing stairs: Denies Difficulty getting out of chair: Denies Difficulty using hands for taps, buttons, cutlery, and/or writing: Denies   No Rheumatology ROS completed.   PMFS History:  Patient Active Problem List   Diagnosis Date Noted  . Pain in right hip 06/20/2016  . High risk medication use 05/31/2016  . History of hypertension 05/31/2016  . History of sleep apnea 05/31/2016  . HLA B27 (HLA B27 positive) 05/31/2016  . Trochanteric bursitis of both hips 05/31/2016  . OSA (obstructive sleep apnea) 01/08/2016  . Overweight 01/08/2016  . Hyperlipidemia 01/08/2016  . Rheumatoid arthritis ([place]) 01/08/2016  . Cervicalgia 11/13/2013  . Sprain of neck 11/13/2013  . HTN (hypertension) 07/28/2013  . Mild obesity 07/28/2013    Past Medical History:  Diagnosis Date  . Hypertension   . Rheumatoid arthritis ([place])   . Seasonal allergies   . Sleep apnea    uses a cpap    No family history on file. Past Surgical History:  Procedure Laterality Date  . CARDIAC CATHETERIZATION  05/09/2012   EF 55% Normal Coronary Arteries  .  DOPPLER ECHOCARDIOGRAPHY  09/18/2011   EF>55%  . EXCISION MORTON'S NEUROMA  03/06/2012   Procedure: EXCISION MORTON'S NEUROMA;  Surgeon: Wylene Simmer, MD;  Location: [facility];  Service: Orthopedics;  Laterality: Right;  EXCISION OF RIGHT THIRD WEB SPACE NEUROMA  HENDER RETRACTOR *[place] USED AS TOURNIQUET.  ON AT 7858.  OFF AT 0826.  Marland Kitchen GXT  09/18/2011  . LEFT HEART CATHETERIZATION WITH CORONARY ANGIOGRAM Bilateral 05/26/2012   Procedure: LEFT HEART CATHETERIZATION WITH CORONARY ANGIOGRAM;  Surgeon: Sanda Klein, MD;  Location: [place] [facility] CATH LAB;  Service: Cardiovascular;  Laterality: Bilateral;  . SYNOVECTOMY  03/06/2012   Procedure: SYNOVECTOMY;  Surgeon: Wylene Simmer, MD;  Location: [place];  Service: Orthopedics;  Laterality: Right;  SYNOVECTOMY OF THIRD AND FOURTH METATARSAL PHALANGEAL JOINT  . WISDOM TOOTH EXTRACTION     Social History   Social History Narrative  . No narrative on file     Objective: Vital Signs: BP 130/76   Pulse 76   Wt 215 lb (97.5 kg)   BMI 27.60 kg/m    Physical Exam   Musculoskeletal Exam: C-spine, thoracic, lumbar spine good range of motion. Shoulder joints, elbow joints, wrist joints, MCPs PIPs DIPs with good range of motion with no synovitis hip joints knee joints ankles MTPs PIPs were good range of motion with no tenderness on synovitis on examination.  CDAI Exam:  No CDAI exam completed.    Investigation: No additional findings. Office Visit on 06/15/2016  Component Date Value Ref Range Status  . Sed Rate 06/15/2016 42* 0 - 15 mm/hr Final  . CRP 06/15/2016 141.9* <8.0 mg/L Final  . WBC 06/15/2016 11.5* 3.8 - 10.8 K/uL Final  . RBC 06/15/2016 4.26  4.20 - 5.80 MIL/uL Final  . Hemoglobin 06/15/2016 13.4  13.2 - 17.1 g/dL Final  . HCT 06/15/2016 39.1  38.5 - 50.0 % Final  . MCV 06/15/2016 91.8  80.0 - 100.0 fL Final  . MCH 06/15/2016 31.5  27.0 - 33.0 pg Final  . MCHC 06/15/2016 34.3  32.0 - 36.0 g/dL Final  . RDW  06/15/2016 13.2  11.0 - 15.0 % Final  . Platelets 06/15/2016 240  140 - 400 K/uL Final  . MPV 06/15/2016 8.9  7.5 - 12.5 fL Final  . Neutro Abs 06/15/2016 10120* 1,500 - 7,800 cells/uL Final  . Lymphs Abs 06/15/2016 805* 850 - 3,900 cells/uL Final  . Monocytes Absolute 06/15/2016 575  200 - 950 cells/uL Final  . Eosinophils Absolute 06/15/2016 0* 15 - 500 cells/uL Final  . Basophils Absolute 06/15/2016 0  0 - 200 cells/uL Final  . Neutrophils Relative % 06/15/2016 88  % Final  . Lymphocytes Relative 06/15/2016 7  % Final  . Monocytes Relative 06/15/2016 5  % Final  . Eosinophils Relative 06/15/2016 0  % Final  . Basophils Relative 06/15/2016 0  % Final  . Smear Review 06/15/2016 Criteria for review not met   Final  . Sodium 06/15/2016 138  135 - 146 mmol/L Final  . Potassium 06/15/2016 4.2  3.5 - 5.3 mmol/L Final  . Chloride 06/15/2016 105  98 - 110 mmol/L Final  . CO2 06/15/2016 23  20 - 31 mmol/L Final  . Glucose, Bld 06/15/2016 142* 65 - 99 mg/dL Final  . BUN 06/15/2016 17  7 - 25 mg/dL Final  . Creat 06/15/2016 0.89  0.60 - 1.35 mg/dL Final  . Total Bilirubin 06/15/2016 0.4  0.2 - 1.2 mg/dL Final  . Alkaline Phosphatase 06/15/2016 54  40 - 115 U/L Final  . AST 06/15/2016 14  10 - 40 U/L Final  . ALT 06/15/2016 23  9 - 46 U/L Final  . Total Protein 06/15/2016 7.4  6.1 - 8.1 g/dL Final  . Albumin 06/15/2016 4.3  3.6 - 5.1 g/dL Final  . Calcium 06/15/2016 9.3  8.6 - 10.3 mg/dL Final  . GFR, Est African American 06/15/2016 >89  >=60 mL/min Final  . GFR, Est Non African American 06/15/2016 >89  >=60 mL/min Final  Admission on 06/14/2016, Discharged on 06/14/2016  Component Date Value Ref Range Status  . WBC 06/14/2016 7.4  4.0 - 10.5 K/uL Final  . RBC 06/14/2016 4.08* 4.22 - 5.81 MIL/uL Final  . Hemoglobin 06/14/2016 12.6* 13.0 - 17.0 g/dL Final  . HCT 06/14/2016 37.6* 39.0 - 52.0 % Final  . MCV 06/14/2016 92.2  78.0 - 100.0 fL Final  . MCH 06/14/2016 30.9  26.0 - 34.0 pg Final    . MCHC 06/14/2016 33.5  30.0 - 36.0 g/dL Final  . RDW 06/14/2016 11.8  11.5 - 15.5 % Final  . Platelets 06/14/2016 174  150 - 400 K/uL Final  . Sodium 06/14/2016 137  135 - 145 mmol/L Final  . Potassium 06/14/2016 4.0  3.5 - 5.1 mmol/L Final  . Chloride 06/14/2016 106  101 - 111 mmol/L Final  . CO2 06/14/2016 23  22 - 32 mmol/L Final  . Glucose, Bld 06/14/2016 109* 65 - 99 mg/dL Final  . BUN 06/14/2016 15  6 - 20 mg/dL Final  . Creatinine, Ser 06/14/2016 0.83  0.61 - 1.24 mg/dL Final  . Calcium 06/14/2016 8.8* 8.9 - 10.3 mg/dL Final  . GFR calc non Af Amer 06/14/2016 >60  >60 mL/min Final  . GFR calc Af Amer 06/14/2016 >60  >60 mL/min Final   Comment: (NOTE) The eGFR has been calculated using the CKD EPI equation. This calculation has not been validated in all clinical situations. eGFR's persistently <60 mL/min signify possible Chronic Kidney Disease.   . Anion gap 06/14/2016 8  5 - 15 Final  . CRP 06/14/2016 3.7* <1.0 mg/dL Final  . Sed Rate 06/14/2016 29* 0 - 16 mm/hr Final     Imaging: No results found.  Speciality Comments: No specialty comments available.    Procedures:  No procedures performed Allergies: Patient has no known allergies.   Assessment / Plan:     Visit Diagnoses: HLA B27 (HLA B27 positive)  Rheumatoid arthritis involving multiple sites with positive rheumatoid factor ([place]): He is doing better on the prednisone taper. He is still on methotrexate 0.8 ML subcutaneously. He has been off Cimzia as he failed treatment and started [place] today. We will check his labs in about 2 weeks then every 2 months to monitor for drug toxicity. Side effects were again reviewed.  Trochanteric bursitis of both hips: Symptoms are better  History of sleep apnea  History of hypertension  High risk medication use: Currently on prednisone 10 mg, methotrexate 0.8 ML subcutaneously and Xeljanz 11 mg. We will also check his lipid panel in 2 months.  Mild obesity: Diet and  exercise was discussed.  Pain in right hip : Improving   Orders: Orders Placed This Encounter  Procedures  . CBC with Differential/Platelet  . COMPLETE METABOLIC PANEL WITH GFR  . CBC with Differential/Platelet  . COMPLETE METABOLIC PANEL WITH GFR  . Lipid panel   Meds ordered this encounter  Medications  . methotrexate 50 MG/2ML injection    Sig: Inject 0.8 mLs (20 mg total) into the skin once a week.    Dispense:  10 mL    Refill:  0    Dispense 2 ML vials with preservatives; dispense enough vials for 90 day supply    Face-to-face time spent with patient was 30 minutes. 50% of time was spent in counseling and coordination of care.  Follow-Up Instructions: Return in about 3 months (around 10/16/2016) for Rheumatoid arthritis. Association of heart disease with rheumatoid arthritis was discussed. Need to monitor blood pressure, cholesterol, and to exercise 30-60 minutes on daily basis was discussed. Poor dental hygiene can be a predisposing factor for rheumatoid arthritis. Good dental hygiene was discussed.  Bo Merino, MD  Note - This record has been created using Editor, commissioning.  Chart creation errors have been sought, but may not always  have been located. Such creation errors do not reflect on  the standard of medical care.

## 2016-07-10 NOTE — Telephone Encounter (Signed)
Patient called and stated that the medication you all are switching to Bobby Kramer) is not on his pharmacy online profile.  Wanted to make sure it has been sent to the pharmacy.  KP#[phone]

## 2016-07-11 MED ORDER — TOFACITINIB CITRATE ER 11 MG PO TB24: 11.0000 mg | ORAL_TABLET | Freq: Every day | ORAL | 2 refills | Status: DC

## 2016-07-11 NOTE — Telephone Encounter (Signed)
Prescription sent to pharmacy and left message to notify patient.

## 2016-07-11 NOTE — Telephone Encounter (Signed)
Prescription has not been sent yet.  Plan on 06/20/16 was to switch patient from Cimzia to Xeljanz XR 11mg .  Most recent Cimzia injection was on 06/17/16 and patient was not due to start 06/19/16 until one month after the most recent Cimzia (07/18/16).  Dr. 07/20/16 wanted to wait until closer to the start date before sending prescription.    Okay to send in prescription for Xeljanz XR 11 mg?  [facility] is the required specialty pharmacy.

## 2016-07-11 NOTE — Telephone Encounter (Signed)
Okay to send an prescription of Xeljanz XR 11 mg today and he should be getting it in about 7 days which will allow him to start this L [place]z at the appropriate time (i.e.: 1 month after stopping symptoms yet injection).

## 2016-07-11 NOTE — Addendum Note (Signed)
Addended by: Henriette Combs on: 07/11/2016 10:50 AM   Modules accepted: Orders

## 2016-07-19 ENCOUNTER — Encounter: Payer: Self-pay | Admitting: Rheumatology

## 2016-07-19 ENCOUNTER — Ambulatory Visit (INDEPENDENT_AMBULATORY_CARE_PROVIDER_SITE_OTHER): Payer: BLUE CROSS/BLUE SHIELD | Admitting: Rheumatology

## 2016-07-19 ENCOUNTER — Telehealth: Payer: Self-pay | Admitting: Rheumatology

## 2016-07-19 VITALS — BP 130/76 | HR 76 | Wt 215.0 lb

## 2016-07-19 DIAGNOSIS — E669 Obesity, unspecified: Secondary | ICD-10-CM | POA: Diagnosis not present

## 2016-07-19 DIAGNOSIS — M7061 Trochanteric bursitis, right hip: Secondary | ICD-10-CM | POA: Diagnosis not present

## 2016-07-19 DIAGNOSIS — M0579 Rheumatoid arthritis with rheumatoid factor of multiple sites without organ or systems involvement: Secondary | ICD-10-CM | POA: Diagnosis not present

## 2016-07-19 DIAGNOSIS — M7062 Trochanteric bursitis, left hip: Secondary | ICD-10-CM | POA: Diagnosis not present

## 2016-07-19 DIAGNOSIS — Z1589 Genetic susceptibility to other disease: Secondary | ICD-10-CM

## 2016-07-19 DIAGNOSIS — M25551 Pain in right hip: Secondary | ICD-10-CM

## 2016-07-19 DIAGNOSIS — Z8669 Personal history of other diseases of the nervous system and sense organs: Secondary | ICD-10-CM

## 2016-07-19 DIAGNOSIS — Z8679 Personal history of other diseases of the circulatory system: Secondary | ICD-10-CM

## 2016-07-19 DIAGNOSIS — Z79899 Other long term (current) drug therapy: Secondary | ICD-10-CM

## 2016-07-19 MED ORDER — METHOTREXATE SODIUM CHEMO INJECTION 50 MG/2ML: 20.0000 mg | INTRAMUSCULAR | 0 refills | Status: DC

## 2016-07-19 NOTE — Telephone Encounter (Signed)
Clarification given for MTX prescription and verbal given for syringes.

## 2016-07-19 NOTE — Progress Notes (Signed)
Rheumatology Medication Review by a Pharmacist Does the patient feel that his/her medications are working for him/her?  Patient started Xeljanz XR 11 mg daily today.   Has the patient been experiencing any side effects to the medications prescribed?  No Does the patient have any problems obtaining medications?  No  Issues to address at subsequent visits: None   Pharmacist comments:  Bobby Kramer is a pleasant 46 yo M who presents for follow up of rheumatoid arthritis.  He is currently taking Xeljanz XR 11 mg daily, methotrexate 0.8 mL weekly, and folic acid 2 mg daily.  He just started [place] therapy today (one month after his last Cimzia injection).  Patient will be due for standing labs in two weeks, then two months.  He will also need a lipid panel in 2 months.  Patient denies any questions regarding his medications at this time.    Lilla Shook, Pharm.D., BCPS, CPP Clinical Pharmacist Pager: [phone] Phone: [phone] 07/19/2016 11:06 AM

## 2016-07-19 NOTE — Patient Instructions (Addendum)
Standing Labs We placed an order today for your standing lab work.    Please come back and get your standing labs in 2 weeks, then every 2 months.  You will need a fasting lipid panel in 2 months.    We have open lab Monday through Friday from 8:30-11:30 AM and 1:30-4 PM at the office of Dr. Arbutus Ped, PA.   The office is located at [address], [place], [place] [address] No appointment is necessary.   Labs are drawn by [facility].  You may receive a bill from [facility] for your lab work.    Supplements for OA Natural anti-inflammatories  You can purchase these at [employer], [employer] or online.  . Turmeric (capsules)  . Ginger (ginger root or capsules)  . Omega 3 (Fish, flax seeds, chia seeds, walnuts, almonds)  . Tart cherry (dried or extract)   Patient should be under the care of a physician while taking these supplements. This may not be reproduced without the permission of Dr. Pollyann Savoy.

## 2016-07-19 NOTE — Telephone Encounter (Signed)
She needs clarification on his MTX prescription.CB#[phone].  Thank you

## 2016-07-31 DIAGNOSIS — M05771 Rheumatoid arthritis with rheumatoid factor of right ankle and foot without organ or systems involvement: Secondary | ICD-10-CM | POA: Diagnosis not present

## 2016-07-31 DIAGNOSIS — I10 Essential (primary) hypertension: Secondary | ICD-10-CM | POA: Diagnosis not present

## 2016-07-31 DIAGNOSIS — E782 Mixed hyperlipidemia: Secondary | ICD-10-CM | POA: Diagnosis not present

## 2016-07-31 DIAGNOSIS — Z713 Dietary counseling and surveillance: Secondary | ICD-10-CM | POA: Diagnosis not present

## 2016-07-31 DIAGNOSIS — Z008 Encounter for other general examination: Secondary | ICD-10-CM | POA: Diagnosis not present

## 2016-08-01 ENCOUNTER — Ambulatory Visit: Payer: BLUE CROSS/BLUE SHIELD | Admitting: Rheumatology

## 2016-08-14 ENCOUNTER — Telehealth: Payer: Self-pay | Admitting: Radiology

## 2016-08-14 DIAGNOSIS — M05771 Rheumatoid arthritis with rheumatoid factor of right ankle and foot without organ or systems involvement: Secondary | ICD-10-CM | POA: Diagnosis not present

## 2016-08-14 NOTE — Telephone Encounter (Signed)
Please advise Pt to reduce MTX to 0.6 ml sq  qwk

## 2016-08-14 NOTE — Telephone Encounter (Signed)
Patient has had labs result his WBC 3.3 previous was 3.9 he is on 0.60mL of MTX per week and Xeljanz   Please advise

## 2016-08-14 NOTE — Telephone Encounter (Signed)
I have called patient to advise. Left message for him to call us back.

## 2016-08-16 NOTE — Telephone Encounter (Signed)
Patient advised to decrease MTX to 0.6 mL per week. Patient verbalized understanding.

## 2016-08-31 ENCOUNTER — Ambulatory Visit: Payer: BLUE CROSS/BLUE SHIELD | Admitting: Rheumatology

## 2016-09-11 DIAGNOSIS — Z139 Encounter for screening, unspecified: Secondary | ICD-10-CM | POA: Diagnosis not present

## 2016-09-11 DIAGNOSIS — E782 Mixed hyperlipidemia: Secondary | ICD-10-CM | POA: Diagnosis not present

## 2016-09-11 DIAGNOSIS — E559 Vitamin D deficiency, unspecified: Secondary | ICD-10-CM | POA: Diagnosis not present

## 2016-09-11 DIAGNOSIS — Z79899 Other long term (current) drug therapy: Secondary | ICD-10-CM | POA: Diagnosis not present

## 2016-09-20 DIAGNOSIS — Z79899 Other long term (current) drug therapy: Secondary | ICD-10-CM | POA: Diagnosis not present

## 2016-09-20 DIAGNOSIS — E782 Mixed hyperlipidemia: Secondary | ICD-10-CM | POA: Diagnosis not present

## 2016-09-20 DIAGNOSIS — E559 Vitamin D deficiency, unspecified: Secondary | ICD-10-CM | POA: Diagnosis not present

## 2016-09-21 ENCOUNTER — Other Ambulatory Visit: Payer: Self-pay | Admitting: Radiology

## 2016-09-21 ENCOUNTER — Telehealth: Payer: Self-pay | Admitting: Radiology

## 2016-09-21 MED ORDER — TOFACITINIB CITRATE ER 11 MG PO TB24: 11.0000 mg | ORAL_TABLET | Freq: Every day | ORAL | 0 refills | Status: DC

## 2016-09-21 MED ORDER — TOFACITINIB CITRATE ER 11 MG PO TB24
11.0000 mg | ORAL_TABLET | Freq: Every day | ORAL | 0 refills | Status: DC
Start: 2016-09-21 — End: 2016-09-21

## 2016-09-21 NOTE — Telephone Encounter (Signed)
Resent Rx to [facility], I sent to the local pharmacy in error.

## 2016-09-21 NOTE — Telephone Encounter (Signed)
Refill request received via fax for Xeljanz 11mg  XR  07/19/16 next visit  10/03/16 last visit  Labs 07/31/16 show WBC low 3.3 He has reduced MTX to 0.6 due to low WBC Ok to refill per Dr 09/30/16

## 2016-09-27 NOTE — Progress Notes (Signed)
Assessment / Plan:     Visit Diagnoses: HLA B27 (HLA B27 positive)  Rheumatoid arthritis involving multiple sites with positive rheumatoid factor ([place]): He is doing better on the prednisone taper. He is still on methotrexate 0.8 ML subcutaneously. He has been off Cimzia as he failed treatment and started [place] today. We will check his labs in about 2 weeks then every 2 months to monitor for drug toxicity. Side effects were again reviewed.  Trochanteric bursitis of both hips: Symptoms are better  History of sleep apnea  History of hypertension  High risk medication use: Currently on prednisone 10 mg, methotrexate 0.8 ML subcutaneously and Xeljanz 11 mg. We will also check his lipid panel in 2 months.  Mild obesity: Diet and exercise was discussed.  Pain in right hip : Improving

## 2016-09-28 ENCOUNTER — Telehealth: Payer: Self-pay | Admitting: *Deleted

## 2016-09-28 NOTE — Telephone Encounter (Signed)
Received labs drawn on 09/11/16, reviewed by Dr. Corliss Skains  CBC, CMP and TSH WNL  Total Cholesterol 236 (H) HDL 36 Triglycerides 148 LDL 146 (H) Chol/HDLC Ratio 3.7  Non Cholesterol 173 (H)

## 2016-10-03 ENCOUNTER — Ambulatory Visit (INDEPENDENT_AMBULATORY_CARE_PROVIDER_SITE_OTHER): Payer: BLUE CROSS/BLUE SHIELD | Admitting: Rheumatology

## 2016-10-03 ENCOUNTER — Inpatient Hospital Stay (INDEPENDENT_AMBULATORY_CARE_PROVIDER_SITE_OTHER): Payer: BLUE CROSS/BLUE SHIELD

## 2016-10-03 DIAGNOSIS — M79672 Pain in left foot: Secondary | ICD-10-CM

## 2016-10-03 DIAGNOSIS — M79671 Pain in right foot: Secondary | ICD-10-CM

## 2016-10-03 MED ORDER — METHOTREXATE SODIUM CHEMO INJECTION 50 MG/2ML: 15.0000 mg | INTRAMUSCULAR | 0 refills | Status: DC

## 2016-10-16 ENCOUNTER — Ambulatory Visit: Payer: BLUE CROSS/BLUE SHIELD | Admitting: Rheumatology

## 2016-12-09 NOTE — Progress Notes (Signed)
Office Visit Note  Patient: Bobby Kramer             Date of Birth: Jan 01, 1971           MRN: [id number]             PCP: Jake Samples, PA-C Referring: Jake Samples, [place]* Visit Date: 12/11/2016 Occupation: _0 @    Subjective:  Left foot plantar fascitis.   History of Present Illness: Bobby Kramer is a 46 y.o. male with history of sero positive rheumatoid arthritis. He feels he is doing very well on combination of Xeljanz and methotrexate. He's been having some discomfort in his left plantar fascia. None of the joints have been swollen. His trochanteric bursitis has resolved.   Activities of Daily Living:  Patient reports morning stiffness for 5 minutes.   Patient Denies nocturnal pain.  Difficulty dressing/grooming: Denies Difficulty climbing stairs: Denies Difficulty getting out of chair: Denies Difficulty using hands for taps, buttons, cutlery, and/or writing: Denies   Review of Systems  Constitutional: Positive for fatigue. Negative for night sweats and weakness ( ).  HENT: Negative for mouth sores, mouth dryness and nose dryness.   Eyes: Negative for redness and dryness.  Respiratory: Negative for shortness of breath and difficulty breathing.   Cardiovascular: Positive for hypertension. Negative for chest pain, palpitations, irregular heartbeat and swelling in legs/feet.  Gastrointestinal: Negative for constipation and diarrhea.  Endocrine: Negative for increased urination.  Musculoskeletal: Positive for morning stiffness. Negative for arthralgias, joint pain, joint swelling, myalgias, muscle weakness, muscle tenderness and myalgias.       Related to MTX  Skin: Positive for sensitivity to sunlight. Negative for color change, rash, hair loss, nodules/bumps, skin tightness and ulcers.  Allergic/Immunologic: Negative for susceptible to infections.  Neurological: Negative for dizziness, fainting, memory loss and night sweats.  Hematological: Negative for  swollen glands.  Psychiatric/Behavioral: Negative for depressed mood and sleep disturbance. The patient is not nervous/anxious.     PMFS History:  Patient Active Problem List   Diagnosis Date Noted  . Pain in right hip 06/20/2016  . High risk medication use 05/31/2016  . History of hypertension 05/31/2016  . History of sleep apnea 05/31/2016  . HLA B27 (HLA B27 positive) 05/31/2016  . Trochanteric bursitis of both hips 05/31/2016  . OSA (obstructive sleep apnea) 01/08/2016  . Overweight 01/08/2016  . Hyperlipidemia 01/08/2016  . Rheumatoid arthritis ([place]) 01/08/2016  . Cervicalgia 11/13/2013  . Sprain of neck 11/13/2013  . HTN (hypertension) 07/28/2013  . Mild obesity 07/28/2013    Past Medical History:  Diagnosis Date  . Hypertension   . Rheumatoid arthritis ([place])   . Seasonal allergies   . Sleep apnea    uses a cpap    Family History  Problem Relation Age of Onset  . Ovarian cancer Mother   . Diabetes Father   . Heart disease Father    Past Surgical History:  Procedure Laterality Date  . CARDIAC CATHETERIZATION  05/09/2012   EF 55% Normal Coronary Arteries  . DOPPLER ECHOCARDIOGRAPHY  09/18/2011   EF>55%  . EXCISION MORTON'S NEUROMA  03/06/2012   Procedure: EXCISION MORTON'S NEUROMA;  Surgeon: Wylene Simmer, MD;  Location: [facility];  Service: Orthopedics;  Laterality: Right;  EXCISION OF RIGHT THIRD WEB SPACE NEUROMA  HENDER RETRACTOR *[place] USED AS TOURNIQUET.  ON AT 6213.  OFF AT 0826.  Marland Kitchen GXT  09/18/2011  . LEFT HEART CATHETERIZATION WITH CORONARY ANGIOGRAM Bilateral 05/26/2012  Procedure: LEFT HEART CATHETERIZATION WITH CORONARY ANGIOGRAM;  Surgeon: Sanda Klein, MD;  Location: [facility] CATH LAB;  Service: Cardiovascular;  Laterality: Bilateral;  . SYNOVECTOMY  03/06/2012   Procedure: SYNOVECTOMY;  Surgeon: Wylene Simmer, MD;  Location: [facility];  Service: Orthopedics;  Laterality: Right;  SYNOVECTOMY OF THIRD AND FOURTH METATARSAL  PHALANGEAL JOINT  . WISDOM TOOTH EXTRACTION     Social History   Social History Narrative  . No narrative on file     Objective: Vital Signs: BP 112/66 (BP Location: Left Arm, Patient Position: Sitting, Cuff Size: Normal)   Pulse 70   Resp 18   Ht '6\' 2"'$  (1.88 m)   Wt 218 lb (98.9 kg)   BMI 27.99 kg/m    Physical Exam  Constitutional: He is oriented to person, place, and time. He appears well-developed and well-nourished.  HENT:  Head: Normocephalic and atraumatic.  Eyes: Pupils are equal, round, and reactive to light. Conjunctivae and EOM are normal.  Neck: Normal range of motion. Neck supple.  Cardiovascular: Normal rate, regular rhythm and normal heart sounds.   Pulmonary/Chest: Effort normal and breath sounds normal.  Abdominal: Soft. Bowel sounds are normal.  Neurological: He is alert and oriented to person, place, and time.  Skin: Skin is warm and dry. Capillary refill takes less than 2 seconds.  Psychiatric: He has a normal mood and affect. His behavior is normal.  Nursing note and vitals reviewed.    Musculoskeletal Exam: C-spine and thoracic lumbar spine good range of motion. Shoulder joints although joints was joint MCPs PIPs DIPs with good range of motion. Hip joints knee joints ankles MTPs PIPs are good range of motion. He is some tenderness over left plantar fascia.  CDAI Exam: CDAI Homunculus Exam:   Joint Counts:  CDAI Tender Joint count: 0 CDAI Swollen Joint count: 0  Global Assessments:  Patient Global Assessment: 2 Provider Global Assessment: 2  CDAI Calculated Score: 4    Investigation: No additional findings. 09/11/2016 LDL 146, CBC normal, CMP normal, vitamin D normal TB gold September 2017 was normal Imaging: No results found. 10/03/2016 ultrasound bilateral feet revealed mild synovitis in the right third and fourth MTP joint with some synovial thickening improvement noted compared to previous ultrasound.Marland Kitchen Speciality Comments: No  specialty comments available.    Procedures:  No procedures performed Allergies: Patient has no known allergies.   Assessment / Plan:     Visit Diagnoses: Rheumatoid arthritis involving multiple sites with positive rheumatoid factor ([place]): He is doing much better now with minimal discomfort. He had no synovitis on examination today.  High risk medication use - Xeljanz XR 11 mg daily, methotrexate 0.6 ML subcutaneous every week, folic acid 2 mg by mouth daily. As he is doing well he wants to decrease his methotrexate and will to switch 2 tablets of methotrexate. After detailed discussion with decided to give her methotrexate 4 tablets by mouth every week and will change his folic acid to 1 mg by mouth daily. His labs will be monitored every 3 months. We will obtain CBC CMP and sedimentation rate today.  Left plantar fasciitis: Plantar fasciitis exercise were demonstrated and discussed. A handout was given. We'll see response to that.  HLA B27 (HLA B27 positive)  History of hypertension: His blood pressure is well controlled.  History of sleep apnea  Mixed hyperlipidemia: His LDL has increased. Have advised him to schedule an appointment with his PCP. We will check his lipid panel with his laxed labs in  October.  OSA (obstructive sleep apnea)  Mild obesity  Association of heart disease with rheumatoid arthritis was discussed. Need to monitor blood pressure, cholesterol, and to exercise 30-60 minutes on daily basis was discussed. Poor dental hygiene can be a predisposing factor for rheumatoid arthritis. Good dental hygiene was discussed.  Orders: No orders of the defined types were placed in this encounter.  No orders of the defined types were placed in this encounter.   Face-to-face time spent with patient was 30 minutes. 50% of time was spent in counseling and coordination of care.  Follow-Up Instructions: Return in about 5 months (around 05/13/2017) for Rheumatoid  arthritis.   Bo Merino, MD  Note - This record has been created using Editor, commissioning.  Chart creation errors have been sought, but may not always  have been located. Such creation errors do not reflect on  the standard of medical care.

## 2016-12-11 ENCOUNTER — Ambulatory Visit (INDEPENDENT_AMBULATORY_CARE_PROVIDER_SITE_OTHER): Payer: BLUE CROSS/BLUE SHIELD | Admitting: Rheumatology

## 2016-12-11 ENCOUNTER — Encounter: Payer: Self-pay | Admitting: Rheumatology

## 2016-12-11 VITALS — BP 112/66 | HR 70 | Resp 18 | Ht 74.0 in | Wt 218.0 lb

## 2016-12-11 DIAGNOSIS — M0579 Rheumatoid arthritis with rheumatoid factor of multiple sites without organ or systems involvement: Secondary | ICD-10-CM

## 2016-12-11 DIAGNOSIS — Z79899 Other long term (current) drug therapy: Secondary | ICD-10-CM

## 2016-12-11 DIAGNOSIS — Z8679 Personal history of other diseases of the circulatory system: Secondary | ICD-10-CM

## 2016-12-11 DIAGNOSIS — Z8669 Personal history of other diseases of the nervous system and sense organs: Secondary | ICD-10-CM

## 2016-12-11 DIAGNOSIS — Z1589 Genetic susceptibility to other disease: Secondary | ICD-10-CM

## 2016-12-11 DIAGNOSIS — E782 Mixed hyperlipidemia: Secondary | ICD-10-CM

## 2016-12-11 DIAGNOSIS — M722 Plantar fascial fibromatosis: Secondary | ICD-10-CM | POA: Diagnosis not present

## 2016-12-11 DIAGNOSIS — G4733 Obstructive sleep apnea (adult) (pediatric): Secondary | ICD-10-CM | POA: Diagnosis not present

## 2016-12-11 DIAGNOSIS — E669 Obesity, unspecified: Secondary | ICD-10-CM | POA: Diagnosis not present

## 2016-12-11 LAB — CBC WITH DIFFERENTIAL/PLATELET
BASOS ABS: 0 {cells}/uL (ref 0–200)
Basophils Relative: 0 %
EOS ABS: 59 {cells}/uL (ref 15–500)
EOS PCT: 1 %
HEMATOCRIT: 41.7 % (ref 38.5–50.0)
HEMOGLOBIN: 14.3 g/dL (ref 13.2–17.1)
LYMPHS ABS: 1534 {cells}/uL (ref 850–3900)
Lymphocytes Relative: 26 %
MCH: 31.6 pg (ref 27.0–33.0)
MCHC: 34.3 g/dL (ref 32.0–36.0)
MCV: 92.3 fL (ref 80.0–100.0)
MONO ABS: 767 {cells}/uL (ref 200–950)
MPV: 9.1 fL (ref 7.5–12.5)
Monocytes Relative: 13 %
NEUTROS ABS: 3540 {cells}/uL (ref 1500–7800)
Neutrophils Relative %: 60 %
Platelets: 236 10*3/uL (ref 140–400)
RBC: 4.52 MIL/uL (ref 4.20–5.80)
RDW: 12.9 % (ref 11.0–15.0)
WBC: 5.9 10*3/uL (ref 3.8–10.8)

## 2016-12-11 MED ORDER — TOFACITINIB CITRATE ER 11 MG PO TB24: 11.0000 mg | ORAL_TABLET | Freq: Every day | ORAL | 0 refills | Status: DC

## 2016-12-11 MED ORDER — METHOTREXATE 2.5 MG PO TABS: 10.0000 mg | ORAL_TABLET | ORAL | 0 refills | Status: DC

## 2016-12-11 MED ORDER — FOLIC ACID 1 MG PO TABS: 1.0000 mg | ORAL_TABLET | Freq: Every day | ORAL | 3 refills | Status: DC

## 2016-12-11 NOTE — Patient Instructions (Addendum)
Standing Labs We placed an order today for your standing lab work.    Please come back and get your standing labs in October and every 3 months  We have open lab Monday through Friday from 8:30-11:30 AM and 1:30-4 PM at the office of Dr. Pollyann Savoy.   The office is located at [address], [place], [place] [address] No appointment is necessary.   Labs are drawn by [facility].  You may receive a bill from [facility] for your lab work. If you have any questions regarding directions or hours of operation,  please call [phone].   Plantar Fasciitis Rehab Ask your health care provider which exercises are safe for you. Do exercises exactly as told by your health care provider and adjust them as directed. It is normal to feel mild stretching, pulling, tightness, or discomfort as you do these exercises, but you should stop right away if you feel sudden pain or your pain gets worse. Do not begin these exercises until told by your health care provider. Stretching and range of motion exercises These exercises warm up your muscles and joints and improve the movement and flexibility of your foot. These exercises also help to relieve pain. Exercise A: Plantar fascia stretch  1. Sit with your left / right leg crossed over your opposite knee. 2. Hold your heel with one hand with that thumb near your arch. With your other hand, hold your toes and gently pull them back toward the top of your foot. You should feel a stretch on the bottom of your toes or your foot or both. 3. Hold this stretch for__________ seconds. 4. Slowly release your toes and return to the starting position. Repeat __________ times. Complete this exercise __________ times a day. Exercise B: Gastroc, standing  1. Stand with your hands against a wall. 2. Extend your left / right leg behind you, and bend your front knee slightly. 3. Keeping your heels on the floor and keeping your back knee straight, shift your weight toward  the wall without arching your back. You should feel a gentle stretch in your left / right calf. 4. Hold this position for __________ seconds. Repeat __________ times. Complete this exercise __________ times a day. Exercise C: Soleus, standing 1. Stand with your hands against a wall. 2. Extend your left / right leg behind you, and bend your front knee slightly. 3. Keeping your heels on the floor, bend your back knee and slightly shift your weight over the back leg. You should feel a gentle stretch deep in your calf. 4. Hold this position for __________ seconds. Repeat __________ times. Complete this exercise __________ times a day. Exercise D: Gastrocsoleus, standing 1. Stand with the ball of your left / right foot on a step. The ball of your foot is on the walking surface, right under your toes. 2. Keep your other foot firmly on the same step. 3. Hold onto the wall or a railing for balance. 4. Slowly lift your other foot, allowing your body weight to press your heel down over the edge of the step. You should feel a stretch in your left / right calf. 5. Hold this position for __________ seconds. 6. Return both feet to the step. 7. Repeat this exercise with a slight bend in your left / right knee. Repeat __________ times with your left / right knee straight and __________ times with your left / right knee bent. Complete this exercise __________ times a day. Balance exercise This exercise builds  your balance and strength control of your arch to help take pressure off your plantar fascia. Exercise E: Single leg stand 1. Without shoes, stand near a railing or in a doorway. You may hold onto the railing or door frame as needed. 2. Stand on your left / right foot. Keep your big toe down on the floor and try to keep your arch lifted. Do not let your foot roll inward. 3. Hold this position for __________ seconds. 4. If this exercise is too easy, you can try it with your eyes closed or while standing  on a pillow. Repeat __________ times. Complete this exercise __________ times a day. This information is not intended to replace advice given to you by your health care provider. Make sure you discuss any questions you have with your health care provider. Document Released: 05/14/2005 Document Revised: 01/17/2016 Document Reviewed: 03/28/2015 Elsevier Interactive Patient Education  2018 [employer].  Association of heart disease with rheumatoid arthritis was discussed. Need to monitor blood pressure, cholesterol, and to exercise 30-60 minutes on daily basis was discussed. Poor dental hygiene can be a predisposing factor for rheumatoid arthritis. Good dental hygiene was discussed.

## 2016-12-11 NOTE — Progress Notes (Signed)
Rheumatology Medication Review by a Pharmacist Does the patient feel that his/her medications are working for him/her?  Yes Has the patient been experiencing any side effects to the medications prescribed?  No Does the patient have any problems obtaining medications?  No  Issues to address at subsequent visits: None   Pharmacist comments:  Bobby Kramer is a pleasant 46 yo M who presents for follow up of rheumatoid arthritis.  He is currently taking Xeljanz XR 11 mg daily, methotrexate 0.6 mL weekly, and folic acid 2 mg daily.  Patient had standing labs on 09/11/16 which were normal.  He is due for standing labs today.  Patient had lipid panel on 09/11/16, approximately two months after starting Harriette Ohara.  Compared to lipid panel on 02/23/16, LDL increased from 128 to 644, HDL increased from 61 to 63, and total cholesterol increased from 152 to 148.  I advised patient to make sure his primary care provider is following his cholesterol closely while on Harriette Ohara as Harriette Ohara can increase cholesterol levels.  Results of recent cholesterol panel were forwarded to patient's PCP.  Most recent TB Gold negative on 02/07/16.  TB Gold will be due again in September 2018.  Patient denies any questions or concerns regarding his medications at this time.    Lilla Shook, Pharm.D., BCPS, CPP Clinical Pharmacist Pager: [phone] Phone: [phone] 12/11/2016 3:41 PM

## 2016-12-12 LAB — COMPLETE METABOLIC PANEL WITH GFR
ALK PHOS: 54 U/L (ref 40–115)
ALT: 22 U/L (ref 9–46)
AST: 20 U/L (ref 10–40)
Albumin: 4.7 g/dL (ref 3.6–5.1)
BILIRUBIN TOTAL: 0.6 mg/dL (ref 0.2–1.2)
BUN: 13 mg/dL (ref 7–25)
CALCIUM: 9.3 mg/dL (ref 8.6–10.3)
CO2: 24 mmol/L (ref 20–31)
Chloride: 103 mmol/L (ref 98–110)
Creat: 1.11 mg/dL (ref 0.60–1.35)
GFR, EST NON AFRICAN AMERICAN: 80 mL/min (ref >=60)
GLUCOSE: 88 mg/dL (ref 65–99)
POTASSIUM: 4.2 mmol/L (ref 3.5–5.3)
SODIUM: 139 mmol/L (ref 135–146)
TOTAL PROTEIN: 7.1 g/dL (ref 6.1–8.1)

## 2016-12-12 LAB — SEDIMENTATION RATE: SED RATE: 1 mm/h (ref 0–15)

## 2016-12-12 NOTE — Progress Notes (Signed)
WNL

## 2016-12-13 ENCOUNTER — Telehealth: Payer: Self-pay

## 2016-12-13 NOTE — Telephone Encounter (Signed)
Pharmacist with [facility] would like to verify dosage for Folic Acid and MTX.  CB# is [phone], option#1, then option#2   Reference# [id number]

## 2016-12-13 NOTE — Telephone Encounter (Signed)
Spoke with Pharmacist and verified the dose of MTX and folic acid. Patient is on 4 tabs of MTX weekly and Folic Acid 1 tablets daily.

## 2017-01-02 ENCOUNTER — Other Ambulatory Visit: Payer: Self-pay | Admitting: Cardiovascular Disease

## 2017-01-02 ENCOUNTER — Other Ambulatory Visit: Payer: Self-pay | Admitting: Rheumatology

## 2017-01-03 NOTE — Telephone Encounter (Signed)
Last Visit: 12/11/16 Next Visit: 05/13/17 Labs: 12/11/16 WNL  Okay to refill per Dr. Corliss Skains

## 2017-02-05 DIAGNOSIS — Z139 Encounter for screening, unspecified: Secondary | ICD-10-CM | POA: Diagnosis not present

## 2017-02-05 DIAGNOSIS — Z79899 Other long term (current) drug therapy: Secondary | ICD-10-CM | POA: Diagnosis not present

## 2017-02-05 DIAGNOSIS — E559 Vitamin D deficiency, unspecified: Secondary | ICD-10-CM | POA: Diagnosis not present

## 2017-02-05 DIAGNOSIS — E782 Mixed hyperlipidemia: Secondary | ICD-10-CM | POA: Diagnosis not present

## 2017-02-14 DIAGNOSIS — E559 Vitamin D deficiency, unspecified: Secondary | ICD-10-CM | POA: Diagnosis not present

## 2017-02-14 DIAGNOSIS — E782 Mixed hyperlipidemia: Secondary | ICD-10-CM | POA: Diagnosis not present

## 2017-02-14 DIAGNOSIS — Z719 Counseling, unspecified: Secondary | ICD-10-CM | POA: Diagnosis not present

## 2017-03-11 DIAGNOSIS — E663 Overweight: Secondary | ICD-10-CM | POA: Diagnosis not present

## 2017-03-11 DIAGNOSIS — Z6829 Body mass index (BMI) 29.0-29.9, adult: Secondary | ICD-10-CM | POA: Diagnosis not present

## 2017-03-11 DIAGNOSIS — R05 Cough: Secondary | ICD-10-CM | POA: Diagnosis not present

## 2017-03-11 DIAGNOSIS — Z1389 Encounter for screening for other disorder: Secondary | ICD-10-CM | POA: Diagnosis not present

## 2017-03-11 DIAGNOSIS — J3489 Other specified disorders of nose and nasal sinuses: Secondary | ICD-10-CM | POA: Diagnosis not present

## 2017-03-12 DIAGNOSIS — Z008 Encounter for other general examination: Secondary | ICD-10-CM | POA: Diagnosis not present

## 2017-03-15 DIAGNOSIS — Z23 Encounter for immunization: Secondary | ICD-10-CM | POA: Diagnosis not present

## 2017-04-06 ENCOUNTER — Other Ambulatory Visit: Payer: Self-pay | Admitting: Rheumatology

## 2017-04-08 NOTE — Telephone Encounter (Signed)
Last Visit: 12/11/16 Next Visit: 05/13/17 Labs: 02/05/17 WNL  Okay to refill per Dr. Corliss Skains

## 2017-04-09 ENCOUNTER — Ambulatory Visit: Payer: BLUE CROSS/BLUE SHIELD | Admitting: Cardiovascular Disease

## 2017-04-09 ENCOUNTER — Encounter: Payer: Self-pay | Admitting: Cardiovascular Disease

## 2017-04-09 VITALS — BP 121/68 | HR 65 | Ht 74.0 in | Wt 229.0 lb

## 2017-04-09 DIAGNOSIS — G4733 Obstructive sleep apnea (adult) (pediatric): Secondary | ICD-10-CM | POA: Diagnosis not present

## 2017-04-09 DIAGNOSIS — I10 Essential (primary) hypertension: Secondary | ICD-10-CM | POA: Diagnosis not present

## 2017-04-09 DIAGNOSIS — E663 Overweight: Secondary | ICD-10-CM | POA: Diagnosis not present

## 2017-04-09 DIAGNOSIS — M0579 Rheumatoid arthritis with rheumatoid factor of multiple sites without organ or systems involvement: Secondary | ICD-10-CM

## 2017-04-09 DIAGNOSIS — E782 Mixed hyperlipidemia: Secondary | ICD-10-CM | POA: Diagnosis not present

## 2017-04-09 MED ORDER — AMLODIPINE-OLMESARTAN 5-20 MG PO TABS: 1.0000 | ORAL_TABLET | Freq: Every day | ORAL | 3 refills | Status: DC

## 2017-04-09 MED ORDER — HYDROCHLOROTHIAZIDE 12.5 MG PO CAPS: 12.5000 mg | ORAL_CAPSULE | ORAL | 3 refills | Status: DC

## 2017-04-09 NOTE — Patient Instructions (Signed)
Dr Croitoru recommends that you schedule a follow-up appointment in 12 months. You will receive a reminder letter in the mail two months in advance. If you don't receive a letter, please call our office to schedule the follow-up appointment.  If you need a refill on your cardiac medications before your next appointment, please call your pharmacy. 

## 2017-04-09 NOTE — Progress Notes (Signed)
Cardiology Office Note    Date:  04/09/2017   ID:  Bobby Kramer, DOB [birth year], MRN [id number]  PCP:  Avis Epley, PA-C  Cardiologist:   Thurmon Fair, MD   chief complaint: HTN   History of Present Illness:  Bobby Kramer is a 46 y.o. male with systemic hypertension with mild hypertensive heart disease (LVH echo 2013), but without coronary disease or clinically evident heart failure. He returns for routine follow-up. Additional problems include obstructive sleep apnea on CPAP and rheumatoid arthritis, on methotrexate and Xeljanz.  Blood pressure control remains very good and he has no cardiovascular complaints.  He has gained back much of the weight that he lost in the last couple of years.  He remains in 36 inch waistline pounds, and his BMI is just under the obese range.  There has been a parallel increase in his total cholesterol, triglyceride and fasting glucose levels.  He readily admits that his diet has deteriorated.  He is eating out more frequently, commonly pizza or other fast food.  He plays an occasional game of basketball, but is really not exercising on a regular basis.  Labs from 2018 show total cholesterol 236, HDL 63, LDL 146, triglycerides 148, glucose 94, creatinine 0.9, hemoglobin 14.2, normal liver function tests.  The patient specifically denies any chest pain at rest exertion, dyspnea at rest or with exertion, orthopnea, paroxysmal nocturnal dyspnea, syncope, palpitations, focal neurological deficits, intermittent claudication, lower extremity edema, unexplained weight gain, cough, hemoptysis or wheezing.  Feet complaints are better, still has some lingering symptoms of plantar fasciitis.   Past Medical History:  Diagnosis Date  . Hypertension   . Rheumatoid arthritis (HCC)   . Seasonal allergies   . Sleep apnea    uses a cpap    Past Surgical History:  Procedure Laterality Date  . CARDIAC CATHETERIZATION  05/09/2012   EF 55% Normal Coronary  Arteries  . DOPPLER ECHOCARDIOGRAPHY  09/18/2011   EF>55%  . GXT  09/18/2011  . WISDOM TOOTH EXTRACTION      Current Medications: Outpatient Medications Prior to Visit  Medication Sig Dispense Refill  . folic acid (FOLVITE) 1 MG tablet Take 1 tablet (1 mg total) by mouth daily. 90 tablet 3  . methotrexate (RHEUMATREX) 2.5 MG tablet TAKE 4 TABLETS BY MOUTH  ONCE A WEEK. CAUTION  CHEMOTHERAPY PROTECT FROM  LIGHT. 48 tablet 0  . Multiple Vitamin (MULTIVITAMIN) tablet Take 1 tablet by mouth daily.    Harriette Ohara XR 11 MG TB24 TAKE 1 TABLET BY MOUTH EVERY DAY 90 tablet 0  . amLODipine-olmesartan (AZOR) 5-20 MG tablet TAKE 1 TABLET BY MOUTH  DAILY 90 tablet 2  . hydrochlorothiazide (MICROZIDE) 12.5 MG capsule TAKE 1 CAPSULE BY MOUTH  EVERY OTHER DAY 45 capsule 4  . diclofenac sodium (VOLTAREN) 1 % GEL Voltaren Gel 3 grams to 3 large joints upto TID 3 TUBES with 3 refills (Patient not taking: Reported on 04/09/2017) 3 Tube 3  . fluticasone (FLONASE) 50 MCG/ACT nasal spray Place 2 sprays into the nose daily as needed. For seasonal allergies     No facility-administered medications prior to visit.      Allergies:   Patient has no known allergies.   Social History   Socioeconomic History  . Marital status: Married    Spouse name: Not on file  . Number of children: Not on file  . Years of education: Not on file  . Highest education level: Not on file  Social Needs  . Financial resource strain: Not on file  . Food insecurity - worry: Not on file  . Food insecurity - inability: Not on file  . Transportation needs - medical: Not on file  . Transportation needs - non-medical: Not on file  Occupational History  . Not on file  Tobacco Use  . Smoking status: Former Smoker    Last attempt to quit: 03/05/2007    Years since quitting: 10.1  . Smokeless tobacco: Former Engineer, water and Sexual Activity  . Alcohol use: Yes    Comment: rarely  . Drug use: No  . Sexual activity: Not on file    Other Topics Concern  . Not on file  Social History Narrative  . Not on file     Family History:  The patient's Family history significant for longevity and the absence of premature coronary/other cardiac problems   ROS:   Please see the history of present illness.    ROS All other systems reviewed and are negative.   PHYSICAL EXAM:   VS:  BP 121/68   Pulse 65   Ht 6\' 2"  (1.88 m)   Wt 229 lb (103.9 kg)   BMI 29.40 kg/m     General: Alert, oriented x3, no distress, overweight Head: no evidence of trauma, PERRL, EOMI, no exophtalmos or lid lag, no myxedema, no xanthelasma; normal ears, nose and oropharynx Neck: normal jugular venous pulsations and no hepatojugular reflux; brisk carotid pulses without delay and no carotid bruits Chest: clear to auscultation, no signs of consolidation by percussion or palpation, normal fremitus, symmetrical and full respiratory excursions Cardiovascular: normal position and quality of the apical impulse, regular rhythm, normal first and second heart sounds, no murmurs, rubs or gallops Abdomen: no tenderness or distention, no masses by palpation, no abnormal pulsatility or arterial bruits, normal bowel sounds, no hepatosplenomegaly Extremities: no clubbing, cyanosis or edema; 2+ radial, ulnar and brachial pulses bilaterally; 2+ right femoral, posterior tibial and dorsalis pedis pulses; 2+ left femoral, posterior tibial and dorsalis pedis pulses; no subclavian or femoral bruits Neurological: grossly nonfocal Psych: Normal mood and affect   Wt Readings from Last 3 Encounters:  04/09/17 229 lb (103.9 kg)  12/11/16 218 lb (98.9 kg)  07/19/16 215 lb (97.5 kg)      Studies/Labs Reviewed:   EKG:  EKG is ordered today.  The ekg ordered today normal sinus rhythm, minor nonspecific T wave inversion in the anterior and lateral precordial leads, unchanged from 2016 and 2017 tracings  Recent Labs: Labs from 2018 show glucose 94, creatinine 0.9, hemoglobin  14.2, normal liver function tests.  Lipid Panel 2018 total cholesterol 236, HDL 63, LDL 146, triglycerides 148 2017 total cholesterol  205, triglycerides 140, HDL 50, LDL 127.  08/03/2014 TC 215, TG 148, LDL 142, HDL 43, non HDL 172 02/23/2014 TC 209, TG 223, LDL 122, HDL42, non HDL 167  ASSESSMENT:    1. Essential hypertension   2. Overweight   3. Mixed hyperlipidemia   4. OSA (obstructive sleep apnea)   5. Rheumatoid arthritis involving multiple sites with positive rheumatoid factor (HCC)      PLAN:  In order of problems listed above:  1. HTN: Note that he did have LVH on previous echo, which likely explains the abnormalities on his ECG.  Blood pressure is well controlled on current combination of 3 antihypertensive medications.  No changes planned. 2. Overweight: After making progress last year, his weight has deteriorated.  There is a clear  parallel worsening of his lipid profile and even a slightly elevated fasting glucose.  Target waistline 34 inches and BMI of 25 or less.  I would set an initial target of weight loss down to 200 pounds over the next 12 months or so.  Discussed regular physical activity, limiting fast food and high glycemic index carbs. 3. HLP: Pattern on his lipid profile is again changed towards a insulin resistance/metabolic syndrome pattern, suspect that he has more small dense LDL particles now placing him at risk for developing coronary and other vascular problems in the future.  If he can turn this around the next 12 months we will have to start him on a statin.  Increase intake of protein and unsaturated fats, decrease intake of high glycemic index carbs and saturated fat. 4. OSA: Reports compliance with CPAP and benefit from this therapy. 5. RA: Improved symptomatic control.  He should be able to perform more physical activity now.  He is worried more about recurrence of plantar fasciitis and he has rheumatoid arthritis.  Swimming and biking may be good  alternatives to running.   Medication Adjustments/Labs and Tests Ordered: Current medicines are reviewed at length with the patient today.  Concerns regarding medicines are outlined above.  Medication changes, Labs and Tests ordered today are listed in the Patient Instructions below. Patient Instructions  Dr Royann Shivers recommends that you schedule a follow-up appointment in 12 months. You will receive a reminder letter in the mail two months in advance. If you don't receive a letter, please call our office to schedule the follow-up appointment.  If you need a refill on your cardiac medications before your next appointment, please call your pharmacy.    Signed, Thurmon Fair, MD  04/09/2017 5:27 PM    [facility] [address] [place], [place], [place]  [address] Phone: [phone]; Fax: [phone]

## 2017-04-29 NOTE — Progress Notes (Deleted)
Office Visit Note  Patient: Bobby Kramer             Date of Birth: 1971/02/21           MRN: [id number]             PCP: Jake Samples, PA-C Referring: Jake Samples, [place]* Visit Date: 05/13/2017 Occupation: @GUAROCC @    Subjective:  No chief complaint on file.   History of Present Illness: Bobby Kramer is a 46 y.o. male ***   Activities of Daily Living:  Patient reports morning stiffness for *** {minute/hour:19697}.   Patient {ACTIONS;DENIES/REPORTS:21021675::"Denies"} nocturnal pain.  Difficulty dressing/grooming: {ACTIONS;DENIES/REPORTS:21021675::"Denies"} Difficulty climbing stairs: {ACTIONS;DENIES/REPORTS:21021675::"Denies"} Difficulty getting out of chair: {ACTIONS;DENIES/REPORTS:21021675::"Denies"} Difficulty using hands for taps, buttons, cutlery, and/or writing: {ACTIONS;DENIES/REPORTS:21021675::"Denies"}   No Rheumatology ROS completed.   PMFS History:  Patient Active Problem List   Diagnosis Date Noted  . Pain in right hip 06/20/2016  . High risk medication use 05/31/2016  . History of hypertension 05/31/2016  . History of sleep apnea 05/31/2016  . HLA B27 (HLA B27 positive) 05/31/2016  . Trochanteric bursitis of both hips 05/31/2016  . OSA (obstructive sleep apnea) 01/08/2016  . Overweight 01/08/2016  . Hyperlipidemia 01/08/2016  . Rheumatoid arthritis ([place]) 01/08/2016  . Cervicalgia 11/13/2013  . Sprain of neck 11/13/2013  . HTN (hypertension) 07/28/2013  . Mild obesity 07/28/2013    Past Medical History:  Diagnosis Date  . Hypertension   . Rheumatoid arthritis ([place])   . Seasonal allergies   . Sleep apnea    uses a cpap    Family History  Problem Relation Age of Onset  . Ovarian cancer Mother   . Diabetes Father   . Heart disease Father    Past Surgical History:  Procedure Laterality Date  . CARDIAC CATHETERIZATION  05/09/2012   EF 55% Normal Coronary Arteries  . DOPPLER ECHOCARDIOGRAPHY  09/18/2011   EF>55%  . EXCISION  MORTON'S NEUROMA  03/06/2012   Procedure: EXCISION MORTON'S NEUROMA;  Surgeon: Wylene Simmer, MD;  Location: [facility];  Service: Orthopedics;  Laterality: Right;  EXCISION OF RIGHT THIRD WEB SPACE NEUROMA  HENDER RETRACTOR *[place] USED AS TOURNIQUET.  ON AT 6468.  OFF AT 0826.  Marland Kitchen GXT  09/18/2011  . LEFT HEART CATHETERIZATION WITH CORONARY ANGIOGRAM Bilateral 05/26/2012   Procedure: LEFT HEART CATHETERIZATION WITH CORONARY ANGIOGRAM;  Surgeon: Sanda Klein, MD;  Location: [facility] CATH [facility];  Service: Cardiovascular;  Laterality: Bilateral;  . SYNOVECTOMY  03/06/2012   Procedure: SYNOVECTOMY;  Surgeon: Wylene Simmer, MD;  Location: [facility];  Service: Orthopedics;  Laterality: Right;  SYNOVECTOMY OF THIRD AND FOURTH METATARSAL PHALANGEAL JOINT  . WISDOM TOOTH EXTRACTION     Social History   Social History Narrative  . Not on file     Objective: Vital Signs: There were no vitals taken for this visit.   Physical Exam   Musculoskeletal Exam: ***  CDAI Exam: No CDAI exam completed.    Investigation: No additional findings. CBC Latest Ref Rng & Units 12/11/2016 06/15/2016 06/14/2016  WBC 3.8 - 10.8 K/uL 5.9 11.5(H) 7.4  Hemoglobin 13.2 - 17.1 g/dL 14.3 13.4 12.6(L)  Hematocrit 38.5 - 50.0 % 41.7 39.1 37.6(L)  Platelets 140 - 400 K/uL 236 240 174   CMP Latest Ref Rng & Units 12/11/2016 06/15/2016 06/14/2016  Glucose 65 - 99 mg/dL 88 142(H) 109(H)  BUN 7 - 25 mg/dL 13 17 15   Creatinine 0.60 - 1.35 mg/dL 1.11 0.89  0.83  Sodium 135 - 146 mmol/L 139 138 137  Potassium 3.5 - 5.3 mmol/L 4.2 4.2 4.0  Chloride 98 - 110 mmol/L 103 105 106  CO2 20 - 31 mmol/L 24 23 23   Calcium 8.6 - 10.3 mg/dL 9.3 9.3 8.8(L)  Total Protein 6.1 - 8.1 g/dL 7.1 7.4 -  Total Bilirubin 0.2 - 1.2 mg/dL 0.6 0.4 -  Alkaline Phos 40 - 115 U/L 54 54 -  AST 10 - 40 U/L 20 14 -  ALT 9 - 46 U/L 22 23 -    Imaging: No results found.  Speciality Comments: No specialty comments  available.    Procedures:  No procedures performed Allergies: Patient has no known allergies.   Assessment / Plan:     Visit Diagnoses: No diagnosis found.    Orders: No orders of the defined types were placed in this encounter.  No orders of the defined types were placed in this encounter.   Face-to-face time spent with patient was *** minutes. 50% of time was spent in counseling and coordination of care.  Follow-Up Instructions: No Follow-up on file.   Earnestine Mealing, CMA  Note - This record has been created using Editor, commissioning.  Chart creation errors have been sought, but may not always  have been located. Such creation errors do not reflect on  the standard of medical care.

## 2017-04-30 DIAGNOSIS — E782 Mixed hyperlipidemia: Secondary | ICD-10-CM | POA: Diagnosis not present

## 2017-04-30 DIAGNOSIS — Z0000 Encounter for general adult medical examination without abnormal findings: Secondary | ICD-10-CM | POA: Diagnosis not present

## 2017-05-02 DIAGNOSIS — E782 Mixed hyperlipidemia: Secondary | ICD-10-CM | POA: Diagnosis not present

## 2017-05-13 ENCOUNTER — Ambulatory Visit: Payer: BLUE CROSS/BLUE SHIELD | Admitting: Rheumatology

## 2017-05-13 NOTE — Progress Notes (Signed)
Office Visit Note  Patient: Bobby Kramer             Date of Birth: [birth year]           MRN: [id number]             PCP: Jake Samples, PA-C Referring: Jake Samples, [place]* Visit Date: 05/14/2017 Occupation: @GUAROCC @    Subjective:  Left foot pain   History of Present Illness: Bobby Kramer is a 46 y.o. male with history of some sero positive rheumatoid arthritis. He states he is doing better on this combination of Xeljanx and methotrexate. He denies any recent joint swelling. He states his left plantar fasciitis flares off and on. His been tolerating his medications well. He was on subcutaneous methotrexate at 0.4 mL every week. He recently switched to methotrexate 4 tablets by mouth every week.  Activities of Daily Living:  Patient reports morning stiffness for 0 minute.   Patient Denies nocturnal pain.  Difficulty dressing/grooming: Denies Difficulty climbing stairs: Denies Difficulty getting out of chair: Denies Difficulty using hands for taps, buttons, cutlery, and/or writing: Denies   Review of Systems  Constitutional: Positive for fatigue. Negative for night sweats and weakness ( ).  HENT: Negative for mouth sores, mouth dryness and nose dryness.   Eyes: Negative for redness and dryness.  Respiratory: Negative for shortness of breath and difficulty breathing.   Cardiovascular: Negative for chest pain, palpitations, hypertension, irregular heartbeat and swelling in legs/feet.  Gastrointestinal: Negative for constipation and diarrhea.  Endocrine: Negative for increased urination.  Musculoskeletal: Negative for arthralgias, joint pain, joint swelling, myalgias, muscle weakness, morning stiffness, muscle tenderness and myalgias.  Skin: Negative for color change, rash, hair loss, nodules/bumps, skin tightness, ulcers and sensitivity to sunlight.  Allergic/Immunologic: Negative for susceptible to infections.  Neurological: Negative for dizziness, fainting, memory  loss and night sweats.  Hematological: Negative for swollen glands.  Psychiatric/Behavioral: Negative for depressed mood and sleep disturbance. The patient is not nervous/anxious.     PMFS History:  Patient Active Problem List   Diagnosis Date Noted  . Pain in right hip 06/20/2016  . High risk medication use 05/31/2016  . History of hypertension 05/31/2016  . History of sleep apnea 05/31/2016  . HLA B27 (HLA B27 positive) 05/31/2016  . Trochanteric bursitis of both hips 05/31/2016  . OSA (obstructive sleep apnea) 01/08/2016  . Overweight 01/08/2016  . Hyperlipidemia 01/08/2016  . Rheumatoid arthritis ([place]) 01/08/2016  . Cervicalgia 11/13/2013  . Sprain of neck 11/13/2013  . HTN (hypertension) 07/28/2013  . Mild obesity 07/28/2013    Past Medical History:  Diagnosis Date  . Hypertension   . Rheumatoid arthritis ([place])   . Seasonal allergies   . Sleep apnea    uses a cpap    Family History  Problem Relation Age of Onset  . Ovarian cancer Mother   . Cancer Mother        colon   . Diabetes Father   . Heart disease Father   . Healthy Sister   . Healthy Daughter   . Healthy Daughter    Past Surgical History:  Procedure Laterality Date  . CARDIAC CATHETERIZATION  05/09/2012   EF 55% Normal Coronary Arteries  . DOPPLER ECHOCARDIOGRAPHY  09/18/2011   EF>55%  . EXCISION MORTON'S NEUROMA  03/06/2012   Procedure: EXCISION MORTON'S NEUROMA;  Surgeon: Wylene Simmer, MD;  Location: [facility];  Service: Orthopedics;  Laterality: Right;  EXCISION OF RIGHT THIRD WEB SPACE  NEUROMA  HENDER RETRACTOR *[place] USED AS TOURNIQUET.  ON AT 0122.  OFF AT 0826.  Marland Kitchen GXT  09/18/2011  . LEFT HEART CATHETERIZATION WITH CORONARY ANGIOGRAM Bilateral 05/26/2012   Procedure: LEFT HEART CATHETERIZATION WITH CORONARY ANGIOGRAM;  Surgeon: Sanda Klein, MD;  Location: [facility];  Service: Cardiovascular;  Laterality: Bilateral;  . SYNOVECTOMY  03/06/2012   Procedure: SYNOVECTOMY;   Surgeon: Wylene Simmer, MD;  Location: [place] [facility];  Service: Orthopedics;  Laterality: Right;  SYNOVECTOMY OF THIRD AND FOURTH METATARSAL PHALANGEAL JOINT  . WISDOM TOOTH EXTRACTION     Social History   Social History Narrative  . Not on file     Objective: Vital Signs: BP 108/65 (BP Location: Left Arm, Patient Position: Sitting, Cuff Size: Normal)   Pulse 77   Resp 14   Ht 6' 2"  (1.88 m)   Wt 229 lb (103.9 kg)   BMI 29.40 kg/m    Physical Exam  Constitutional: He is oriented to person, place, and time. He appears well-developed and well-nourished.  HENT:  Head: Normocephalic and atraumatic.  Eyes: Conjunctivae and EOM are normal. Pupils are equal, round, and reactive to light.  Neck: Normal range of motion. Neck supple.  Cardiovascular: Normal rate, regular rhythm and normal heart sounds.  Pulmonary/Chest: Effort normal and breath sounds normal.  Abdominal: Soft. Bowel sounds are normal.  Neurological: He is alert and oriented to person, place, and time.  Skin: Skin is warm and dry. Capillary refill takes less than 2 seconds.  Psychiatric: He has a normal mood and affect. His behavior is normal.  Nursing note and vitals reviewed.    Musculoskeletal Exam: C-spine and thoracic lumbar spine good range of motion. Shoulder joints elbow joints wrist joint MCPs PIPs DIPs with good range of motion. Hip joints knee joints ankles were good range of motion with no synovitis. He states he has intermittent discomfort in his plantar fascia which is slightly tender today.  CDAI Exam: CDAI Homunculus Exam:   Joint Counts:  CDAI Tender Joint count: 0 CDAI Swollen Joint count: 0  Global Assessments:  Patient Global Assessment: 2 Provider Global Assessment: 2  CDAI Calculated Score: 4    Investigation: No additional findings. CBC Latest Ref Rng & Units 12/11/2016 06/15/2016 06/14/2016  WBC 3.8 - 10.8 K/uL 5.9 11.5(H) 7.4  Hemoglobin 13.2 - 17.1 g/dL 14.3 13.4 12.6(L)    Hematocrit 38.5 - 50.0 % 41.7 39.1 37.6(L)  Platelets 140 - 400 K/uL 236 240 174   CMP Latest Ref Rng & Units 12/11/2016 06/15/2016 06/14/2016  Glucose 65 - 99 mg/dL 88 142(H) 109(H)  BUN 7 - 25 mg/dL 13 17 15   Creatinine 0.60 - 1.35 mg/dL 1.11 0.89 0.83  Sodium 135 - 146 mmol/L 139 138 137  Potassium 3.5 - 5.3 mmol/L 4.2 4.2 4.0  Chloride 98 - 110 mmol/L 103 105 106  CO2 20 - 31 mmol/L 24 23 23   Calcium 8.6 - 10.3 mg/dL 9.3 9.3 8.8(L)  Total Protein 6.1 - 8.1 g/dL 7.1 7.4 -  Total Bilirubin 0.2 - 1.2 mg/dL 0.6 0.4 -  Alkaline Phos 40 - 115 U/L 54 54 -  AST 10 - 40 U/L 20 14 -  ALT 9 - 46 U/L 22 23 -   September 2018 CBC normal, CMP normal, lipid panel LDL 115 Patient reports that in December 2018 his labs were normal we do not have those available he will bring those to [place] or email to [place]. Imaging: No results found.  Speciality Comments: No specialty comments available.    Procedures:  No procedures performed Allergies: Patient has no known allergies.   Assessment / Plan:     Visit Diagnoses: Rheumatoid arthritis involving multiple sites with positive rheumatoid factor ([place]): He has no active synovitis on examination his been doing quite well on combination therapy. Per his request prescription refill for Morrie Sheldon was given today. Side effects were reviewed at length.  High risk medication use - Xeljanz 22m XR, MTX 4 tabs po q wk, folic acid 130mpo qd. His labs were normal in September. He states he had labs in December and he will forward those to [place] will need CBC, CMP with GFR every 3 months. And fasting lipid panel once a year. The colon is to keep LDL below 100.  HLA B27 (HLA B27 positive)  Plantar fasciitis, left: Intermittent pain only.  History of hyperlipidemia: His last LDL was 1:15. Have advised him to follow up with his PCP.  History of hypertension) blood pressure is well controlled.  History of sleep apnea    Orders: No orders of the defined types were  placed in this encounter.  Meds ordered this encounter  Medications  . Tofacitinib Citrate (XELJANZ XR) 11 MG TB24    Sig: Take 1 tablet by mouth daily.    Dispense:  90 tablet    Refill:  0   Face-to-face time spent patient was 30 minutes. Greater than 50% time was spent counseling and coordination of care.  Follow-Up Instructions: Return in about 5 months (around 10/12/2017) for Rheumatoid arthritis.   ShBo MerinoMD  Note - This record has been created using DrEditor, commissioning Chart creation errors have been sought, but may not always  have been located. Such creation errors do not reflect on  the standard of medical care.

## 2017-05-14 ENCOUNTER — Encounter: Payer: Self-pay | Admitting: Rheumatology

## 2017-05-14 ENCOUNTER — Ambulatory Visit: Payer: BLUE CROSS/BLUE SHIELD | Admitting: Rheumatology

## 2017-05-14 VITALS — BP 108/65 | HR 77 | Resp 14 | Ht 74.0 in | Wt 229.0 lb

## 2017-05-14 DIAGNOSIS — Z1589 Genetic susceptibility to other disease: Secondary | ICD-10-CM | POA: Diagnosis not present

## 2017-05-14 DIAGNOSIS — Z8679 Personal history of other diseases of the circulatory system: Secondary | ICD-10-CM

## 2017-05-14 DIAGNOSIS — Z8669 Personal history of other diseases of the nervous system and sense organs: Secondary | ICD-10-CM | POA: Diagnosis not present

## 2017-05-14 DIAGNOSIS — M722 Plantar fascial fibromatosis: Secondary | ICD-10-CM

## 2017-05-14 DIAGNOSIS — M0579 Rheumatoid arthritis with rheumatoid factor of multiple sites without organ or systems involvement: Secondary | ICD-10-CM | POA: Diagnosis not present

## 2017-05-14 DIAGNOSIS — Z79899 Other long term (current) drug therapy: Secondary | ICD-10-CM | POA: Diagnosis not present

## 2017-05-14 DIAGNOSIS — Z8639 Personal history of other endocrine, nutritional and metabolic disease: Secondary | ICD-10-CM

## 2017-05-14 MED ORDER — TOFACITINIB CITRATE ER 11 MG PO TB24: 1.0000 | ORAL_TABLET | Freq: Every day | ORAL | 0 refills | Status: DC

## 2017-05-14 NOTE — Patient Instructions (Addendum)
Standing Labs We placed an order today for your standing lab work.    Please come back and get your standing labs (CBC, CMP with GFR) in March and every 3 months TB gold and fasting lipid panel are due in July 2019  We have open lab Monday through Friday from 8:30-11:30 AM and 1:30-4 PM at the office of Dr. Pollyann Savoy.   The office is located at [address], [address], [place], [place] [address] No appointment is necessary.   Labs are drawn by [facility].  You may receive a bill from [facility] for your lab work. If you have any questions regarding directions or hours of operation,  please call [phone].

## 2017-07-01 DIAGNOSIS — D485 Neoplasm of uncertain behavior of skin: Secondary | ICD-10-CM | POA: Diagnosis not present

## 2017-07-01 DIAGNOSIS — L570 Actinic keratosis: Secondary | ICD-10-CM | POA: Diagnosis not present

## 2017-07-01 DIAGNOSIS — B079 Viral wart, unspecified: Secondary | ICD-10-CM | POA: Diagnosis not present

## 2017-07-13 DIAGNOSIS — J069 Acute upper respiratory infection, unspecified: Secondary | ICD-10-CM | POA: Diagnosis not present

## 2017-07-15 ENCOUNTER — Telehealth: Payer: Self-pay

## 2017-07-15 NOTE — Telephone Encounter (Signed)
Received a refill request for Bobby Kramer from [name]. Authorization was submitted to pts insurance via cover my meds.   Received confirmation from cover my meds stating that Bobby Kramer has been approved from 07/15/2017 thorugh 07/15/2018.   Will send document to scan center.  Reference number: [id number] Phone: [phone]  Called pt to update. Pt voices understanding and denies any questions at this time.   Francenia Chimenti, [place], CPhT 8:42 AM

## 2017-08-06 DIAGNOSIS — E559 Vitamin D deficiency, unspecified: Secondary | ICD-10-CM | POA: Diagnosis not present

## 2017-08-06 DIAGNOSIS — E782 Mixed hyperlipidemia: Secondary | ICD-10-CM | POA: Diagnosis not present

## 2017-08-06 DIAGNOSIS — Z79899 Other long term (current) drug therapy: Secondary | ICD-10-CM | POA: Diagnosis not present

## 2017-08-06 DIAGNOSIS — Z139 Encounter for screening, unspecified: Secondary | ICD-10-CM | POA: Diagnosis not present

## 2017-08-13 ENCOUNTER — Other Ambulatory Visit: Payer: Self-pay | Admitting: Rheumatology

## 2017-08-13 NOTE — Telephone Encounter (Addendum)
Last Visit: 05/14/17 Next Visit: 10/17/17 Labs: 04/30/17 WNL  Patient has updated labs. Awaiting results and will fax them to the office.   Okay to refill 30 day supply per Dr. Corliss Skains

## 2017-08-20 DIAGNOSIS — Z008 Encounter for other general examination: Secondary | ICD-10-CM | POA: Diagnosis not present

## 2017-08-20 DIAGNOSIS — I10 Essential (primary) hypertension: Secondary | ICD-10-CM | POA: Diagnosis not present

## 2017-08-20 DIAGNOSIS — E782 Mixed hyperlipidemia: Secondary | ICD-10-CM | POA: Diagnosis not present

## 2017-08-20 DIAGNOSIS — E559 Vitamin D deficiency, unspecified: Secondary | ICD-10-CM | POA: Diagnosis not present

## 2017-08-20 DIAGNOSIS — Z719 Counseling, unspecified: Secondary | ICD-10-CM | POA: Diagnosis not present

## 2017-08-29 ENCOUNTER — Telehealth: Payer: Self-pay | Admitting: *Deleted

## 2017-08-29 DIAGNOSIS — G4733 Obstructive sleep apnea (adult) (pediatric): Secondary | ICD-10-CM | POA: Diagnosis not present

## 2017-08-29 DIAGNOSIS — Z1389 Encounter for screening for other disorder: Secondary | ICD-10-CM | POA: Diagnosis not present

## 2017-08-29 DIAGNOSIS — Z6829 Body mass index (BMI) 29.0-29.9, adult: Secondary | ICD-10-CM | POA: Diagnosis not present

## 2017-08-29 DIAGNOSIS — E663 Overweight: Secondary | ICD-10-CM | POA: Diagnosis not present

## 2017-08-29 DIAGNOSIS — M069 Rheumatoid arthritis, unspecified: Secondary | ICD-10-CM | POA: Diagnosis not present

## 2017-08-29 DIAGNOSIS — Z8249 Family history of ischemic heart disease and other diseases of the circulatory system: Secondary | ICD-10-CM | POA: Diagnosis not present

## 2017-08-29 NOTE — Telephone Encounter (Signed)
Receieved labs from PCP. Patient's cholesterol is elevated and patient is on [place]. Patient needs to contact PCP about medication for elevated cholesterol. Attempted to contact the patient and unable to leave message for patient.

## 2017-09-04 ENCOUNTER — Telehealth: Payer: Self-pay

## 2017-09-04 NOTE — Telephone Encounter (Signed)
Received a referral from [facility] for the pt to have a tcs. Pt is only 46 but his mother had CRC. I called the pt to find out what age she was when she was first diagnosed. He said he didn't know but he would ask her and call me back as soon as he could. He is also concerned about his insurance paying for the procedure before the age of 37.

## 2017-09-05 ENCOUNTER — Other Ambulatory Visit: Payer: Self-pay | Admitting: Rheumatology

## 2017-09-06 NOTE — Telephone Encounter (Addendum)
Last Visit: 05/14/17 Next Visit: 10/17/17 Labs: 04/30/17 WNL  Okay to refill 30 day supply per Dr. Corliss Skains

## 2017-09-11 NOTE — Telephone Encounter (Signed)
Pt called back and said his mother found out she had CRC at age 47. Spoke with EG- ok for the pt to have a tcs now and insurance should pay for him to have it early. Tried to call pt- NA and mailbox was full. Pt needs nurse visit.

## 2017-09-12 ENCOUNTER — Encounter: Payer: Self-pay | Admitting: Internal Medicine

## 2017-09-12 NOTE — Telephone Encounter (Signed)
Tried to call pt- NA-mailbox was full. 

## 2017-09-19 NOTE — Telephone Encounter (Signed)
Tried to call pt- NA-mailbox was full. 

## 2017-09-26 NOTE — Telephone Encounter (Signed)
I spoke with the pt, he would like to wait until he turns 47 before he has this done so he wont have to fight with his insurance. I have cancelled his nurse visit. Please put him on the recall list to mail him a reminder letter for a nurse visit.

## 2017-09-28 DIAGNOSIS — G4733 Obstructive sleep apnea (adult) (pediatric): Secondary | ICD-10-CM | POA: Diagnosis not present

## 2017-10-02 NOTE — Telephone Encounter (Signed)
In epic as a reminder

## 2017-10-04 NOTE — Progress Notes (Deleted)
Office Visit Note  Patient: Bobby Kramer             Date of Birth: [birth year]           MRN: [id number]             PCP: Jake Samples, PA-C Referring: Jake Samples, [place]* Visit Date: 10/17/2017 Occupation: @GUAROCC @    Subjective:  No chief complaint on file.   History of Present Illness: Bobby Kramer is a 47 y.o. male ***   Activities of Daily Living:  Patient reports morning stiffness for *** {minute/hour:19697}.   Patient {ACTIONS;DENIES/REPORTS:21021675::"Denies"} nocturnal pain.  Difficulty dressing/grooming: {ACTIONS;DENIES/REPORTS:21021675::"Denies"} Difficulty climbing stairs: {ACTIONS;DENIES/REPORTS:21021675::"Denies"} Difficulty getting out of chair: {ACTIONS;DENIES/REPORTS:21021675::"Denies"} Difficulty using hands for taps, buttons, cutlery, and/or writing: {ACTIONS;DENIES/REPORTS:21021675::"Denies"}   No Rheumatology ROS completed.   PMFS History:  Patient Active Problem List   Diagnosis Date Noted  . Pain in right hip 06/20/2016  . High risk medication use 05/31/2016  . History of hypertension 05/31/2016  . History of sleep apnea 05/31/2016  . HLA B27 (HLA B27 positive) 05/31/2016  . Trochanteric bursitis of both hips 05/31/2016  . OSA (obstructive sleep apnea) 01/08/2016  . Overweight 01/08/2016  . Hyperlipidemia 01/08/2016  . Rheumatoid arthritis ([employer]) 01/08/2016  . Cervicalgia 11/13/2013  . Sprain of neck 11/13/2013  . HTN (hypertension) 07/28/2013  . Mild obesity 07/28/2013    Past Medical History:  Diagnosis Date  . Hypertension   . Rheumatoid arthritis ([place])   . Seasonal allergies   . Sleep apnea    uses a cpap    Family History  Problem Relation Age of Onset  . Ovarian cancer Mother   . Cancer Mother        colon   . Diabetes Father   . Heart disease Father   . Healthy Sister   . Healthy Daughter   . Healthy Daughter    Past Surgical History:  Procedure Laterality Date  . CARDIAC CATHETERIZATION  05/09/2012   EF  55% Normal Coronary Arteries  . DOPPLER ECHOCARDIOGRAPHY  09/18/2011   EF>55%  . EXCISION MORTON'S NEUROMA  03/06/2012   Procedure: EXCISION MORTON'S NEUROMA;  Surgeon: Wylene Simmer, MD;  Location: [facility];  Service: Orthopedics;  Laterality: Right;  EXCISION OF RIGHT THIRD WEB SPACE NEUROMA  HENDER RETRACTOR *[place] USED AS TOURNIQUET.  ON AT 7530.  OFF AT 0826.  Marland Kitchen GXT  09/18/2011  . LEFT HEART CATHETERIZATION WITH CORONARY ANGIOGRAM Bilateral 05/26/2012   Procedure: LEFT HEART CATHETERIZATION WITH CORONARY ANGIOGRAM;  Surgeon: Sanda Klein, MD;  Location: [facility];  Service: Cardiovascular;  Laterality: Bilateral;  . SYNOVECTOMY  03/06/2012   Procedure: SYNOVECTOMY;  Surgeon: Wylene Simmer, MD;  Location: [facility];  Service: Orthopedics;  Laterality: Right;  SYNOVECTOMY OF THIRD AND FOURTH METATARSAL PHALANGEAL JOINT  . WISDOM TOOTH EXTRACTION     Social History   Social History Narrative  . Not on file     Objective: Vital Signs: There were no vitals taken for this visit.   Physical Exam   Musculoskeletal Exam: ***  CDAI Exam: No CDAI exam completed.    Investigation: No additional findings. CBC Latest Ref Rng & Units 12/11/2016 06/15/2016 06/14/2016  WBC 3.8 - 10.8 K/uL 5.9 11.5(H) 7.4  Hemoglobin 13.2 - 17.1 g/dL 14.3 13.4 12.6(L)  Hematocrit 38.5 - 50.0 % 41.7 39.1 37.6(L)  Platelets 140 - 400 K/uL 236 240 174   CMP Latest Ref Rng & Units 12/11/2016  06/15/2016 06/14/2016  Glucose 65 - 99 mg/dL 88 142(H) 109(H)  BUN 7 - 25 mg/dL 13 17 15   Creatinine 0.60 - 1.35 mg/dL 1.11 0.89 0.83  Sodium 135 - 146 mmol/L 139 138 137  Potassium 3.5 - 5.3 mmol/L 4.2 4.2 4.0  Chloride 98 - 110 mmol/L 103 105 106  CO2 20 - 31 mmol/L 24 23 23   Calcium 8.6 - 10.3 mg/dL 9.3 9.3 8.8(L)  Total Protein 6.1 - 8.1 g/dL 7.1 7.4 -  Total Bilirubin 0.2 - 1.2 mg/dL 0.6 0.4 -  Alkaline Phos 40 - 115 U/L 54 54 -  AST 10 - 40 U/L 20 14 -  ALT 9 - 46 U/L 22 23 -     Imaging: No results found.  Speciality Comments: No specialty comments available.    Procedures:  No procedures performed Allergies: Patient has no known allergies.   Assessment / Plan:     Visit Diagnoses: Rheumatoid arthritis involving multiple sites with positive rheumatoid factor (HCC)  High risk medication use - Xeljanz 47m XR, MTX 4 tabs po q wk, folic acid 159mpo qd  HLA B27 (HLA B27 positive)  Plantar fasciitis, left  Trochanteric bursitis of both hips  Mixed hyperlipidemia  OSA (obstructive sleep apnea)  History of hypertension  History of hyperlipidemia    Orders: No orders of the defined types were placed in this encounter.  No orders of the defined types were placed in this encounter.   Face-to-face time spent with patient was *** minutes. 50% of time was spent in counseling and coordination of care.  Follow-Up Instructions: No follow-ups on file.   TaOfilia NeasPA-C  Note - This record has been created using Dragon software.  Chart creation errors have been sought, but may not always  have been located. Such creation errors do not reflect on  the standard of medical care.

## 2017-10-15 ENCOUNTER — Other Ambulatory Visit: Payer: Self-pay | Admitting: Rheumatology

## 2017-10-15 NOTE — Telephone Encounter (Signed)
Last visit: 05/14/2017 Next visit: 10/17/2017 Labs: 08/06/2017 WNL   Okay to refill per Dr. Corliss Skains.

## 2017-10-17 ENCOUNTER — Ambulatory Visit: Payer: BLUE CROSS/BLUE SHIELD | Admitting: Rheumatology

## 2017-10-22 ENCOUNTER — Ambulatory Visit: Payer: BLUE CROSS/BLUE SHIELD

## 2017-10-23 NOTE — Progress Notes (Signed)
 Office Visit Note  Patient: Bobby Kramer             Date of Birth: 11/03/1970           MRN: [id number]             PCP: Jackson, Samantha J, PA-C Referring: Jackson, Samantha J, [place]* Visit Date: 10/24/2017 Occupation: @GUAROCC@    Subjective:  Knee stiffness and popping.   History of Present Illness: Bobby Kramer is a 46 y.o. male with history of seropositive rheumatoid arthritis.  He states he has not experienced any joint swelling on the current combination therapy.  He has been experiencing some popping sensation in his knee joints.  He is also experience some discomfort which she describes over bilateral trochanteric bursa.  He has noticed more morning stiffness.  He still has ongoing plantar fasciitis in his left foot.  Activities of Daily Living:  Patient reports morning stiffness for 10 minutes.   Patient Denies nocturnal pain.  Difficulty dressing/grooming: Denies Difficulty climbing stairs: Denies Difficulty getting out of chair: Denies Difficulty using hands for taps, buttons, cutlery, and/or writing: Denies   Review of Systems  Constitutional: Negative for activity change.  HENT: Negative for mouth sores and mouth dryness.   Eyes: Negative for dryness.  Respiratory: Negative for shortness of breath and difficulty breathing.   Cardiovascular: Negative for chest pain and swelling in legs/feet.  Gastrointestinal: Negative for abdominal pain and constipation.  Endocrine: Negative for increased urination.  Genitourinary: Negative for pelvic pain.  Musculoskeletal: Positive for arthralgias, joint pain and morning stiffness. Negative for joint swelling.  Skin: Negative for rash.  Allergic/Immunologic: Negative for susceptible to infections.  Neurological: Negative for light-headedness, headaches, memory loss and weakness.  Hematological: Negative for bruising/bleeding tendency.  Psychiatric/Behavioral: Negative for confusion.    PMFS History:  Patient Active  Problem List   Diagnosis Date Noted  . Pain in right hip 06/20/2016  . High risk medication use 05/31/2016  . History of hypertension 05/31/2016  . History of sleep apnea 05/31/2016  . HLA B27 (HLA B27 positive) 05/31/2016  . Trochanteric bursitis of both hips 05/31/2016  . OSA (obstructive sleep apnea) 01/08/2016  . Overweight 01/08/2016  . Hyperlipidemia 01/08/2016  . Rheumatoid arthritis (HCC) 01/08/2016  . Cervicalgia 11/13/2013  . Sprain of neck 11/13/2013  . HTN (hypertension) 07/28/2013  . Mild obesity 07/28/2013    Past Medical History:  Diagnosis Date  . Hypertension   . Rheumatoid arthritis (HCC)   . Seasonal allergies   . Sleep apnea    uses a cpap    Family History  Problem Relation Age of Onset  . Ovarian cancer Mother   . Cancer Mother        colon   . Diabetes Father   . Heart disease Father   . Healthy Sister   . Healthy Daughter   . Healthy Daughter    Past Surgical History:  Procedure Laterality Date  . CARDIAC CATHETERIZATION  05/09/2012   EF 55% Normal Coronary Arteries  . DOPPLER ECHOCARDIOGRAPHY  09/18/2011   EF>55%  . EXCISION MORTON'S NEUROMA  03/06/2012   Procedure: EXCISION MORTON'S NEUROMA;  Surgeon: Anastasios Hewitt, MD;  Location: [facility];  Service: Orthopedics;  Laterality: Right;  EXCISION OF RIGHT THIRD WEB SPACE NEUROMA  HENDER RETRACTOR *[place] USED AS TOURNIQUET.  ON AT 0948.  OFF AT 0826.  . GXT  09/18/2011  . LEFT HEART CATHETERIZATION WITH CORONARY ANGIOGRAM Bilateral 05/26/2012     Procedure: LEFT HEART CATHETERIZATION WITH CORONARY ANGIOGRAM;  Surgeon: Sanda Klein, MD;  Location: [facility];  Service: Cardiovascular;  Laterality: Bilateral;  . SYNOVECTOMY  03/06/2012   Procedure: SYNOVECTOMY;  Surgeon: Wylene Simmer, MD;  Location: [facility];  Service: Orthopedics;  Laterality: Right;  SYNOVECTOMY OF THIRD AND FOURTH METATARSAL PHALANGEAL JOINT  . WISDOM TOOTH EXTRACTION     Social History    Social History Narrative  . Not on file     Objective: Vital Signs: BP 111/68 (BP Location: Left Arm, Patient Position: Sitting, Cuff Size: Normal)   Pulse 82   Resp 14   Ht 6' 2" (1.88 m)   Wt 228 lb (103.4 kg)   BMI 29.27 kg/m    Physical Exam  Constitutional: He is oriented to person, place, and time. He appears well-developed and well-nourished.  HENT:  Head: Normocephalic and atraumatic.  Eyes: Pupils are equal, round, and reactive to light. Conjunctivae and EOM are normal.  Neck: Normal range of motion. Neck supple.  Cardiovascular: Normal rate, regular rhythm and normal heart sounds.  Pulmonary/Chest: Effort normal and breath sounds normal.  Abdominal: Soft. Bowel sounds are normal.  Neurological: He is alert and oriented to person, place, and time.  Skin: Skin is warm and dry. Capillary refill takes less than 2 seconds.  Psychiatric: He has a normal mood and affect. His behavior is normal.  Nursing note and vitals reviewed.    Musculoskeletal Exam: C-spine thoracic lumbar spine good range of motion.  Shoulder joints elbow joints wrist joint MCPs PIPs DIPs with good range of motion with no synovitis.  He had tenderness on palpation of bilateral trochanteric bursa consistent with trochanteric bursitis.  Hip joints knee joints ankles MTPs PIPs with good range of motion with no synovitis.  CDAI Exam: CDAI Homunculus Exam:   Joint Counts:  CDAI Tender Joint count: 0 CDAI Swollen Joint count: 0  Global Assessments:  Patient Global Assessment: 3 Provider Global Assessment: 2  CDAI Calculated Score: 5    Investigation: No additional findings. Labs: 08/06/2017 WNL  Imaging: No results found.  Speciality Comments: No specialty comments available.    Procedures:  No procedures performed Allergies: Patient has no known allergies.   Assessment / Plan:     Visit Diagnoses: Rheumatoid arthritis involving multiple sites with positive rheumatoid factor  (HCC)-patient is clinically doing well with no synovitis on examination although he has been experiencing increased arthralgias.  I believe some of the arthralgias are related to his increased activity at work.  He is to climb several sets of stairs each day.  High risk medication use - Xeljanz 34m XR, MTX 4 tabs po q wk, folic acid 165mpo qd. his labs have been stable.  We will continue to monitor labs every 3 months.  He needs lipid panel once a year.  He plans to get it through his PCP.  Trochanteric bursitis of both hips-he had increased pain and discomfort over bilateral trochanteric bursa.  IT band exercises were demonstrated and discussed.  A handout was given.  HLA B27 (HLA B27 positive)  Plantar fasciitis, left-he continues to have some plantar fasciitis for which she has been doing some stretching.  History of sleep apnea  History of hyperlipidemia-weight loss diet and exercise was discussed at length.  History of hypertension -his blood pressure is well controlled.   Orders: No orders of the defined types were placed in this encounter.  Meds ordered this encounter  Medications  . methotrexate (  RHEUMATREX) 2.5 MG tablet    Sig: TAKE 4 TABLETS BY MOUTH  ONCE A WEEK. CAUTION  CHEMOTHERAPY PROTECT FROM  LIGHT.    Dispense:  48 tablet    Refill:  0    Face-to-face time spent with patient was 30 minutes.> 50% of time was spent in counseling and coordination of care.  Follow-Up Instructions: Return in about 5 months (around 03/26/2018) for Rheumatoid arthritis.    , MD  Note - This record has been created using Dragon software.  Chart creation errors have been sought, but may not always  have been located. Such creation errors do not reflect on  the standard of medical care. 

## 2017-10-24 ENCOUNTER — Encounter: Payer: Self-pay | Admitting: Rheumatology

## 2017-10-24 ENCOUNTER — Ambulatory Visit: Payer: BLUE CROSS/BLUE SHIELD | Admitting: Rheumatology

## 2017-10-24 ENCOUNTER — Other Ambulatory Visit: Payer: Self-pay | Admitting: Rheumatology

## 2017-10-24 VITALS — BP 111/68 | HR 82 | Resp 14 | Ht 74.0 in | Wt 228.0 lb

## 2017-10-24 DIAGNOSIS — Z8669 Personal history of other diseases of the nervous system and sense organs: Secondary | ICD-10-CM

## 2017-10-24 DIAGNOSIS — M722 Plantar fascial fibromatosis: Secondary | ICD-10-CM

## 2017-10-24 DIAGNOSIS — M7061 Trochanteric bursitis, right hip: Secondary | ICD-10-CM

## 2017-10-24 DIAGNOSIS — Z1589 Genetic susceptibility to other disease: Secondary | ICD-10-CM

## 2017-10-24 DIAGNOSIS — Z8679 Personal history of other diseases of the circulatory system: Secondary | ICD-10-CM

## 2017-10-24 DIAGNOSIS — Z79899 Other long term (current) drug therapy: Secondary | ICD-10-CM | POA: Diagnosis not present

## 2017-10-24 DIAGNOSIS — Z8639 Personal history of other endocrine, nutritional and metabolic disease: Secondary | ICD-10-CM | POA: Diagnosis not present

## 2017-10-24 DIAGNOSIS — M7062 Trochanteric bursitis, left hip: Secondary | ICD-10-CM | POA: Diagnosis not present

## 2017-10-24 DIAGNOSIS — M0579 Rheumatoid arthritis with rheumatoid factor of multiple sites without organ or systems involvement: Secondary | ICD-10-CM | POA: Diagnosis not present

## 2017-10-24 MED ORDER — METHOTREXATE 2.5 MG PO TABS: ORAL_TABLET | ORAL | 0 refills | Status: DC

## 2017-10-24 NOTE — Patient Instructions (Addendum)
Standing Labs We placed an order today for your standing lab work.    Please come back and get your standing labs in June and every 3 months Iliotibial Band Syndrome Rehab Ask your health care provider which exercises are safe for you. Do exercises exactly as told by your health care provider and adjust them as directed. It is normal to feel mild stretching, pulling, tightness, or discomfort as you do these exercises, but you should stop right away if you feel sudden pain or your pain gets worse.Do not begin these exercises until told by your health care provider. Stretching and range of motion exercises These exercises warm up your muscles and joints and improve the movement and flexibility of your hip and pelvis. Exercise A: Quadriceps, prone  1. Lie on your abdomen on a firm surface, such as a bed or padded floor. 2. Bend your left / right knee and hold your ankle. If you cannot reach your ankle or pant leg, loop a belt around your foot and grab the belt instead. 3. Gently pull your heel toward your buttocks. Your knee should not slide out to the side. You should feel a stretch in the front of your thigh and knee. 4. Hold this position for __________ seconds. Repeat __________ times. Complete this stretch __________ times a day. Exercise B: Iliotibial band  1. Lie on your side with your left / right leg in the top position. 2. Bend both of your knees and grab your left / right ankle. Stretch out your bottom arm to help you balance. 3. Slowly bring your top knee back so your thigh goes behind your trunk. 4. Slowly lower your top leg toward the floor until you feel a gentle stretch on the outside of your left / right hip and thigh. If you do not feel a stretch and your knee will not fall farther, place the heel of your other foot on top of your knee and pull your knee down toward the floor with your foot. 5. Hold this position for __________ seconds. Repeat __________ times. Complete this  stretch __________ times a day. Strengthening exercises These exercises build strength and endurance in your hip and pelvis. Endurance is the ability to use your muscles for a long time, even after they get tired. Exercise C: Straight leg raises ( hip abductors) 1. Lie on your side with your left / right leg in the top position. Lie so your head, shoulder, knee, and hip line up. You may bend your bottom knee to help you balance. 2. Roll your hips slightly forward so your hips are stacked directly over each other and your left / right knee is facing forward. 3. Tense the muscles in your outer thigh and lift your top leg 4-6 inches (10-15 cm). 4. Hold this position for __________ seconds. 5. Slowly return to the starting position. Let your muscles relax completely before doing another repetition. Repeat __________ times. Complete this exercise __________ times a day. Exercise D: Straight leg raises ( hip extensors) 1. Lie on your abdomen on your bed or a firm surface. You can put a pillow under your hips if that is more comfortable. 2. Bend your left / right knee so your foot is straight up in the air. 3. Squeeze your buttock muscles and lift your left / right thigh off the bed. Do not let your back arch. 4. Tense this muscle as hard as you can without increasing any knee pain. 5. Hold this position for __________  seconds. 6. Slowly lower your leg to the starting position and allow it to relax completely. Repeat __________ times. Complete this exercise __________ times a day. Exercise E: Hip hike 1. Stand sideways on a bottom step. Stand on your left / right leg with your other foot unsupported next to the step. You can hold onto the railing or wall if needed for balance. 2. Keep your knees straight and your torso square. Then, lift your left / right hip up toward the ceiling. 3. Slowly let your left / right hip lower toward the floor, past the starting position. Your foot should get closer to  the floor. Do not lean or bend your knees. Repeat __________ times. Complete this exercise __________ times a day. This information is not intended to replace advice given to you by your health care provider. Make sure you discuss any questions you have with your health care provider. Document Released: 05/14/2005 Document Revised: 01/17/2016 Document Reviewed: 04/15/2015 Elsevier Interactive Patient Education  [employer].   We have open lab Monday through Friday from 8:30-11:30 AM and 1:30-4:00 PM  at the office of Dr. Bo Merino.   You may experience shorter wait times on Monday and Friday afternoons. The office is located at [address], [place], [place], [place] [address] No appointment is necessary.   Labs are drawn by [facility].  You may receive a bill from [facility] for your lab work. If you have any questions regarding directions or hours of operation,  please call [phone].

## 2017-10-29 DIAGNOSIS — G4733 Obstructive sleep apnea (adult) (pediatric): Secondary | ICD-10-CM | POA: Diagnosis not present

## 2017-11-05 DIAGNOSIS — Z79899 Other long term (current) drug therapy: Secondary | ICD-10-CM | POA: Diagnosis not present

## 2017-11-05 DIAGNOSIS — E559 Vitamin D deficiency, unspecified: Secondary | ICD-10-CM | POA: Diagnosis not present

## 2017-11-05 DIAGNOSIS — E782 Mixed hyperlipidemia: Secondary | ICD-10-CM | POA: Diagnosis not present

## 2017-11-12 DIAGNOSIS — E782 Mixed hyperlipidemia: Secondary | ICD-10-CM | POA: Diagnosis not present

## 2017-11-12 DIAGNOSIS — M05771 Rheumatoid arthritis with rheumatoid factor of right ankle and foot without organ or systems involvement: Secondary | ICD-10-CM | POA: Diagnosis not present

## 2017-11-12 DIAGNOSIS — E559 Vitamin D deficiency, unspecified: Secondary | ICD-10-CM | POA: Diagnosis not present

## 2017-11-28 DIAGNOSIS — G4733 Obstructive sleep apnea (adult) (pediatric): Secondary | ICD-10-CM | POA: Diagnosis not present

## 2018-01-06 ENCOUNTER — Encounter: Payer: Self-pay | Admitting: Internal Medicine

## 2018-01-16 ENCOUNTER — Other Ambulatory Visit: Payer: Self-pay | Admitting: Rheumatology

## 2018-01-16 NOTE — Telephone Encounter (Signed)
Last visit: 10/24/17 Next visit: 03/25/18 Labs: 11/05/17 Increased total Cholesterol and Triglycerides.   Okay to refill per Dr. Kerry Dory

## 2018-02-01 IMAGING — DX DG HIP (WITH OR WITHOUT PELVIS) 2-3V*R*
3 series · 3 of 3 positions shown · non-contrast
Comparison: None

CLINICAL DATA: Acute onset of severe lateral RIGHT hip pain upon
awakening this morning, no known injury, history rheumatoid
arthritis, hypertension

EXAM:
DG HIP (WITH OR WITHOUT PELVIS) 2-3V RIGHT

[pelvis ap]
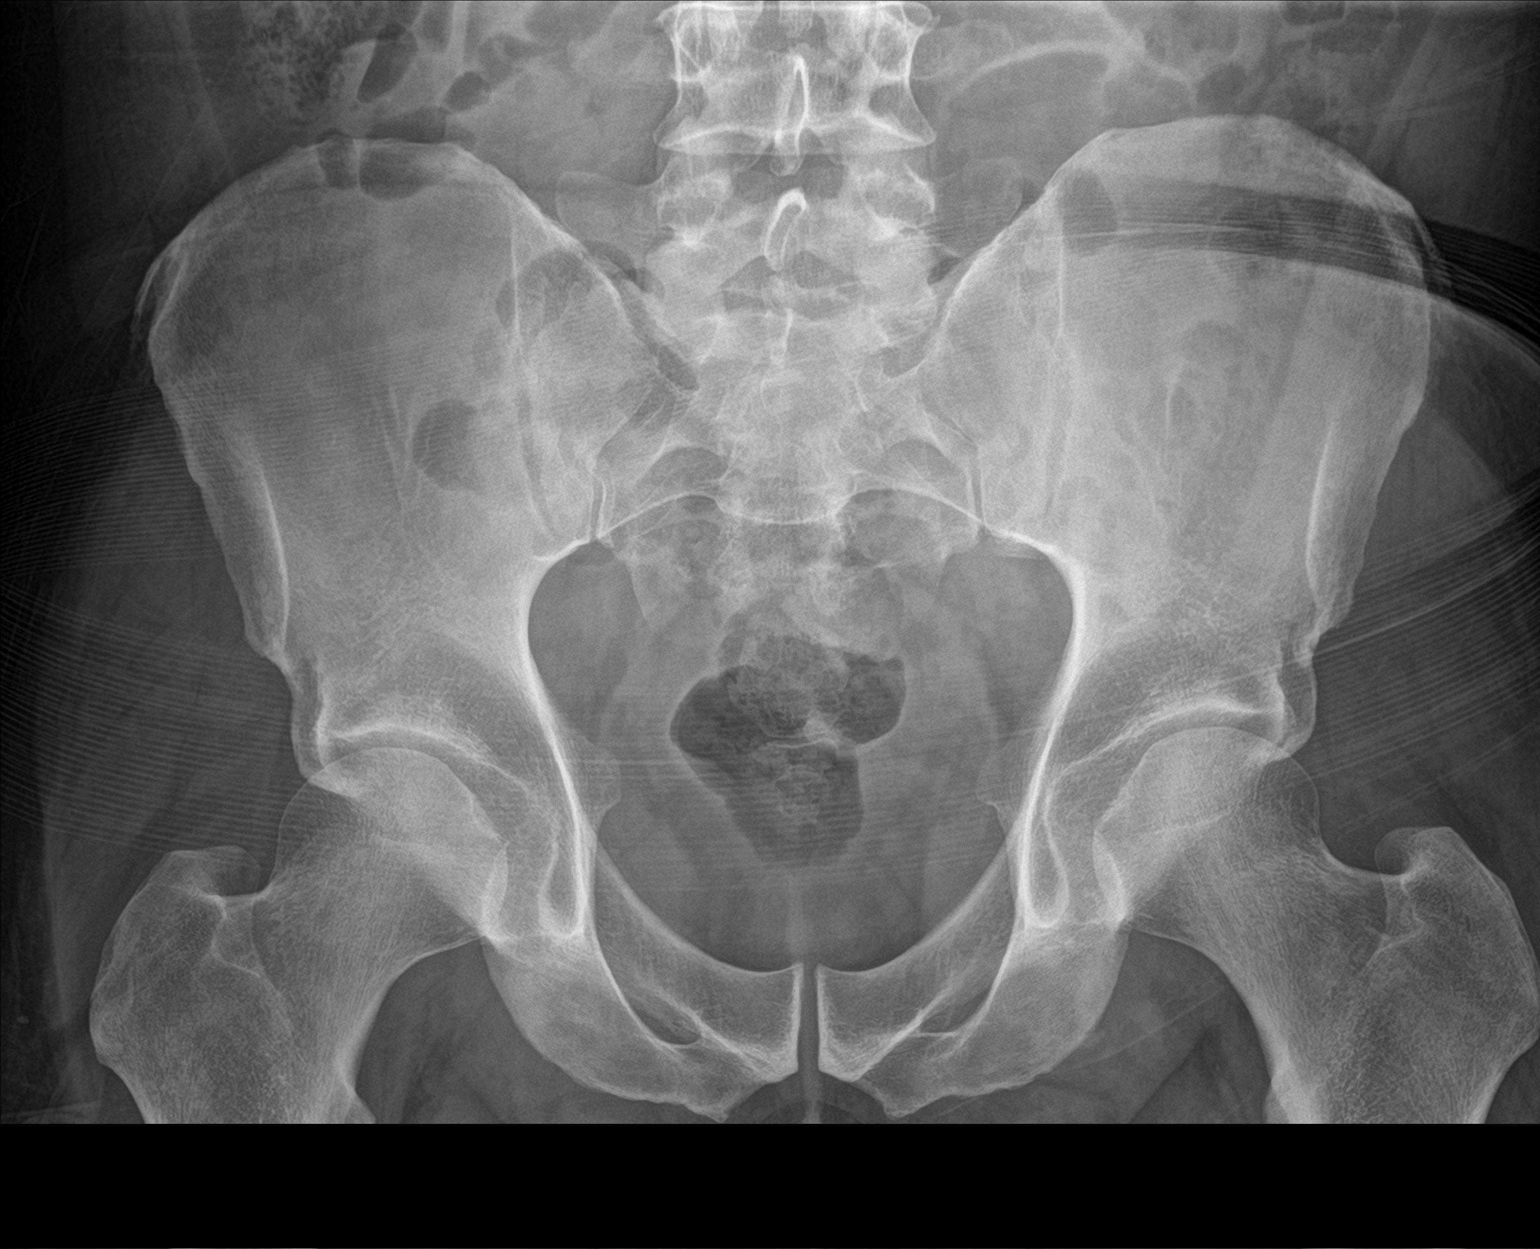

[hip ap]
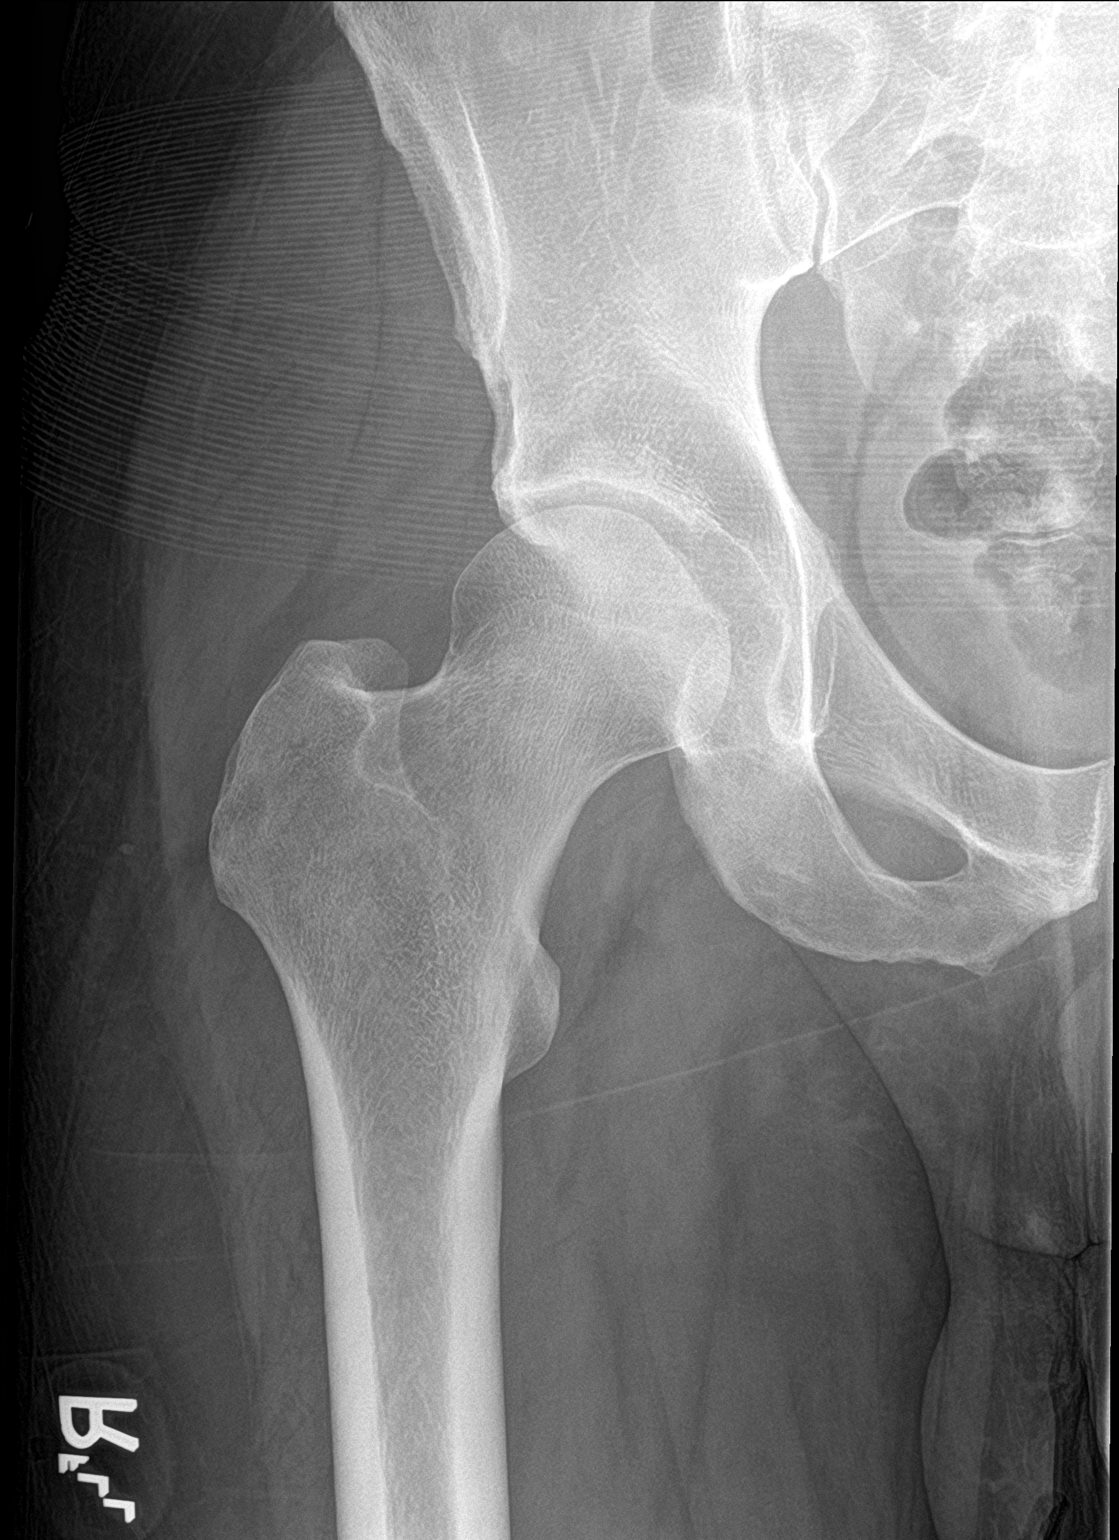

[hip lat]
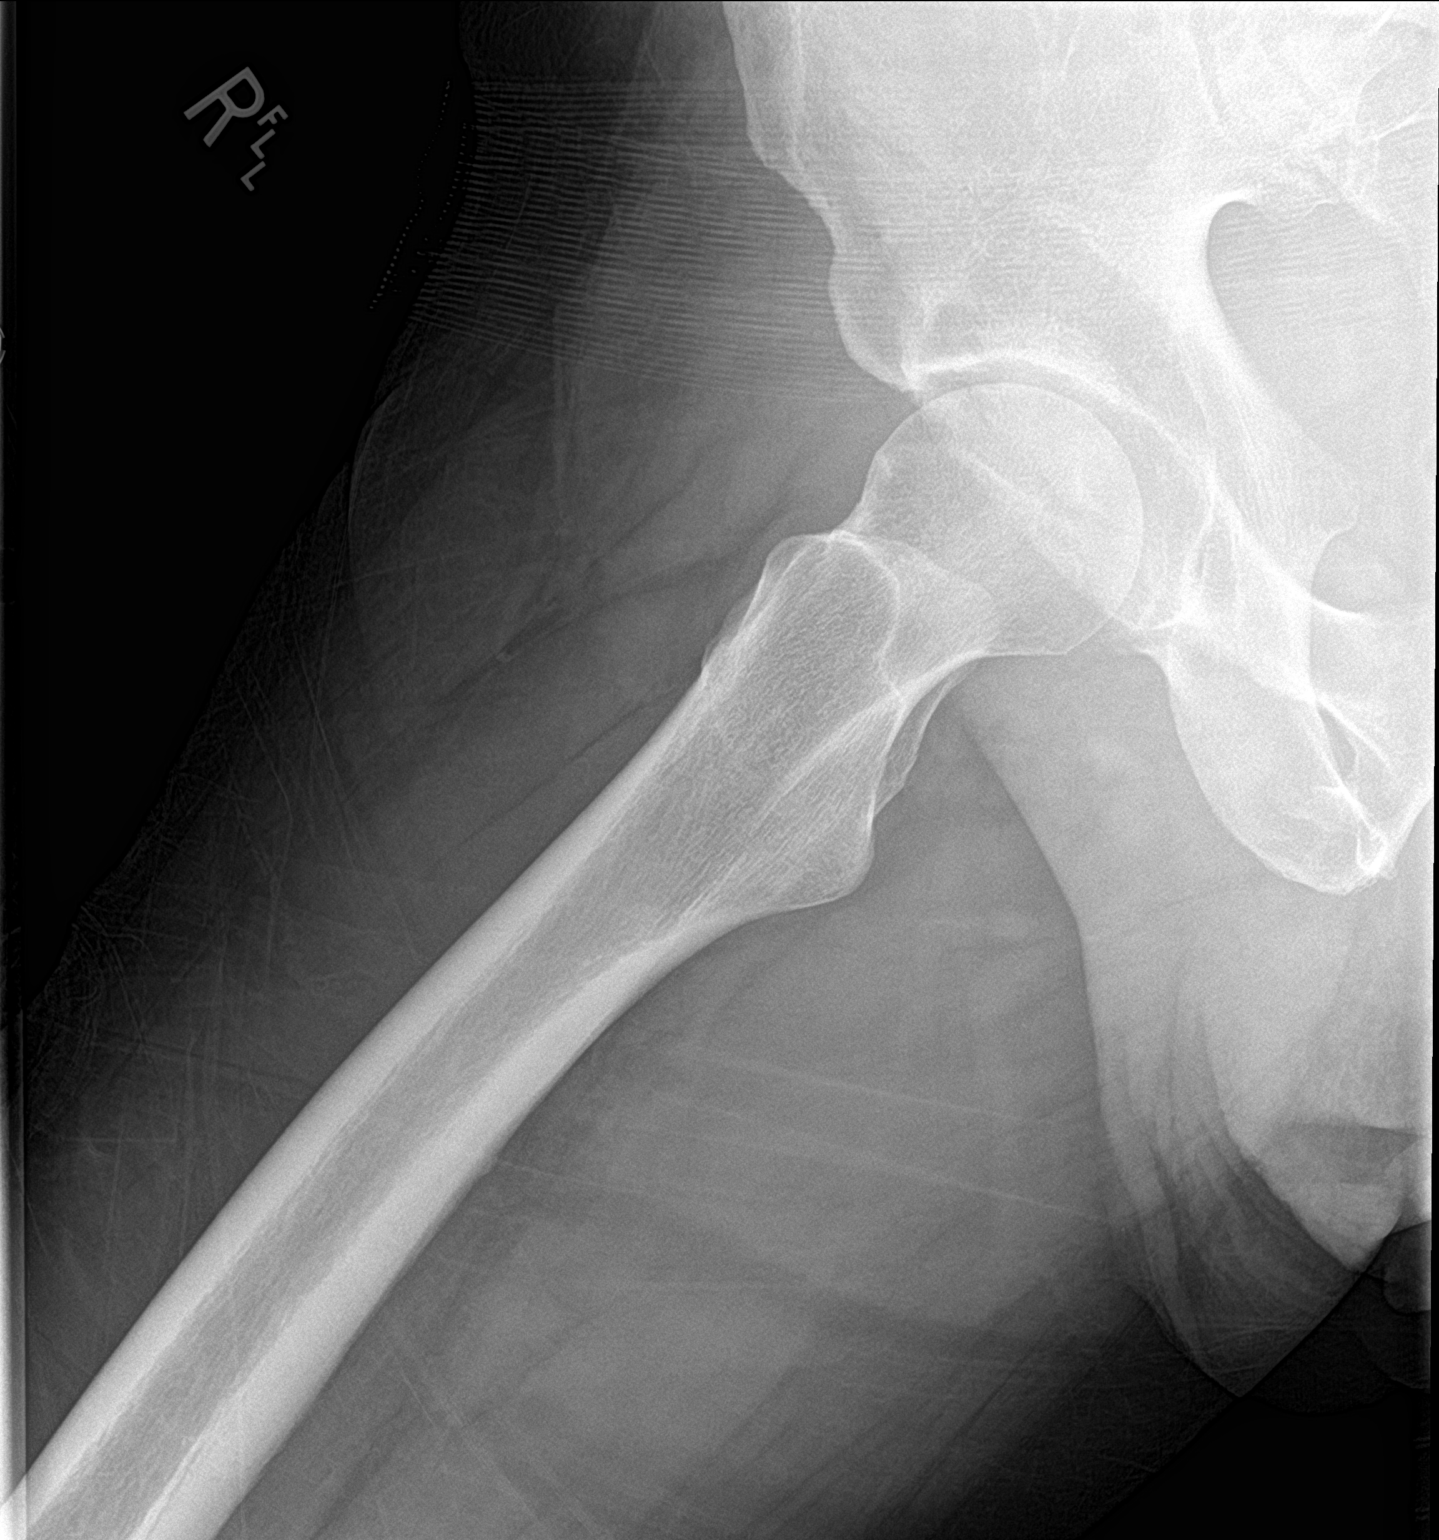

[3 of 3 positions shown; findings below may reference images not displayed]

FINDINGS: Osseous mineralization normal.

Hip and SI joint spaces symmetric and preserved.

No acute fracture, dislocation, bone destruction.

No radiographic soft tissue abnormalities identified.
IMPRESSION: Normal exam.

## 2018-02-06 ENCOUNTER — Ambulatory Visit (INDEPENDENT_AMBULATORY_CARE_PROVIDER_SITE_OTHER): Payer: BLUE CROSS/BLUE SHIELD

## 2018-02-06 DIAGNOSIS — Z1211 Encounter for screening for malignant neoplasm of colon: Secondary | ICD-10-CM

## 2018-02-06 MED ORDER — NA SULFATE-K SULFATE-MG SULF 17.5-3.13-1.6 GM/177ML PO SOLN: 1.0000 | ORAL | 0 refills | Status: DC

## 2018-02-06 MED ORDER — PEG 3350-KCL-NA BICARB-NACL 420 G PO SOLR: 4000.0000 mL | ORAL | 0 refills | Status: DC

## 2018-02-06 NOTE — Progress Notes (Signed)
Sent in suprep, told pt to call me if copay was too expensive. Pt asked for trilyte to be sent in. trilyte sent in and gave the pt new instructions.

## 2018-02-06 NOTE — Progress Notes (Signed)
Ok to schedule.

## 2018-02-06 NOTE — Progress Notes (Signed)
Gastroenterology Pre-Procedure Review  Request Date:02/06/18 Requesting Physician: Terie Purser PA-C- no previous tcs  PATIENT REVIEW QUESTIONS: The patient responded to the following health history questions as indicated:    1. Diabetes Melitis: no 2. Joint replacements in the past 12 months: no 3. Major health problems in the past 3 months: no 4. Has an artificial valve or MVP: no 5. Has a defibrillator: no 6. Has been advised in past to take antibiotics in advance of a procedure like teeth cleaning: no 7. Family history of colon cancer: yes (mother age 27)  38. Alcohol Use: yes (occasionally) 9. History of sleep apnea: yes (on cpap)  10. History of coronary artery or other vascular stents placed within the last 12 months: no 11. History of any prior anesthesia complications: no    MEDICATIONS & ALLERGIES:    Patient reports the following regarding taking any blood thinners:   Plavix? no Aspirin? no Coumadin? no Brilinta? no Xarelto? no Eliquis? no Pradaxa? no Savaysa? no Effient? no  Patient confirms/reports the following medications:  Current Outpatient Medications  Medication Sig Dispense Refill  . amLODipine-olmesartan (AZOR) 5-20 MG tablet Take 1 tablet daily by mouth. 90 tablet 3  . Cholecalciferol (VITAMIN D PO) Take by mouth daily.    . folic acid (FOLVITE) 1 MG tablet Take 1 tablet (1 mg total) by mouth daily. 90 tablet 3  . hydrochlorothiazide (MICROZIDE) 12.5 MG capsule Take 1 capsule (12.5 mg total) every other day by mouth. 45 capsule 3  . methotrexate (RHEUMATREX) 2.5 MG tablet TAKE 4 TABLETS BY MOUTH  ONCE A WEEK. CAUTION  CHEMOTHERAPY PROTECT FROM  LIGHT. 48 tablet 0  . Multiple Vitamin (MULTIVITAMIN) tablet Take 1 tablet by mouth daily.    Harriette Ohara XR 11 MG TB24 TAKE 1 TABLET BY MOUTH EVERY DAY 90 tablet 0   No current facility-administered medications for this visit.     Patient confirms/reports the following allergies:  No Known Allergies  No  orders of the defined types were placed in this encounter.   AUTHORIZATION INFORMATION Primary Insurance: [place],  [place] #: [id number] Pre-Cert / Auth required:no  SCHEDULE INFORMATION: Procedure has been scheduled as follows:  Date 04/04/18: , Time: 1:00 Location: [facility] Dr.Fields  This Gastroenterology Pre-Precedure Review Form is being routed to the following provider(s): Wynne Dust NP

## 2018-02-06 NOTE — Patient Instructions (Addendum)
Bobby Kramer   03/18/1971 MRN: [id number]    Procedure Date: 04/04/18 Time to register: 12:0pm Place to register: [facility] Procedure Time: 1:00pm Scheduled provider: Sandi Fields, MD  PREPARATION FOR COLONOSCOPY WITH TRI-LYTE SPLIT PREP  Please notify us immediately if you are diabetic, take iron supplements, or if you are on Coumadin or any other blood thinners.     You will need to purchase 1 fleet enema and 1 box of Bisacodyl 5mg tablets.  1 DAY BEFORE PROCEDURE:  DATE: 04/03/18  DAY: Thursday Continue clear liquids the entire day - NO SOLID FOOD.    At 2:00 pm:  Take 2 Bisacodyl tablets.   At 4:00pm:  Start drinking your solution. Make sure you mix well per instructions on the bottle. Try to drink 1 (one) 8 ounce glass every 10-15 minutes until you have consumed HALF the jug. You should complete by 6:00pm.You must keep the left over solution refrigerated until completed next day.  Continue clear liquids. You must drink plenty of clear liquids to prevent dehyration and kidney failure.     DAY OF PROCEDURE:   DATE: 04/04/18   DAY: Friday If you take medications for your heart, blood pressure or breathing, you may take these medications.   Five hours before your procedure time @ 8:00am:  Finish remaining amout of bowel prep, drinking 1 (one) 8 ounce glass every 10-15 minutes until complete. You have two hours to consume remaining prep.   Three hours before your procedure time @10:00am:  Nothing by mouth.   At least one hour before going to the hospital:  Give yourself one Fleet enema. You may take your morning medications with sip of water unless we have instructed otherwise.      Please see below for Dietary Information.  CLEAR LIQUIDS INCLUDE:  Water Jello (NOT red in color)   Ice Popsicles (NOT red in color)   Tea (sugar ok, no milk/cream) Powdered fruit flavored drinks  Coffee (sugar ok, no milk/cream) Gatorade/ Lemonade/ Kool-Aid  (NOT red in color)    Juice: apple, white grape, white cranberry Soft drinks  Clear bullion, consomme, broth (fat free beef/chicken/vegetable)  Carbonated beverages (any kind)  Strained chicken noodle soup Hard Candy   Remember: Clear liquids are liquids that will allow you to see your fingers on the other side of a clear glass. Be sure liquids are NOT red in color, and not cloudy, but CLEAR.  DO NOT EAT OR DRINK ANY OF THE FOLLOWING:  Dairy products of any kind   Cranberry juice Tomato juice / V8 juice   Grapefruit juice Orange juice     Red grape juice  Do not eat any solid foods, including such foods as: cereal, oatmeal, yogurt, fruits, vegetables, creamed soups, eggs, bread, crackers, pureed foods in a blender, etc.   HELPFUL HINTS FOR DRINKING PREP SOLUTION:   Make sure prep is extremely cold. Mix and refrigerate the the morning of the prep. You may also put in the freezer.   You may try mixing some Crystal Light or Country Time Lemonade if you prefer. Mix in small amounts; add more if necessary.  Try drinking through a straw  Rinse mouth with water or a mouthwash between glasses, to remove after-taste.  Try sipping on a cold beverage /ice/ popsicles between glasses of prep.  Place a piece of sugar-free hard candy in mouth between glasses.  If you become nauseated, try consuming smaller amounts, or stretch out the time between glasses.   Stop for 30-60 minutes, then slowly start back drinking.        OTHER INSTRUCTIONS  You will need a responsible adult at least 47 years of age to accompany you and drive you home. This person must remain in the waiting room during your procedure. The hospital will cancel your procedure if you do not have a responsible adult with you.   1. Wear loose fitting clothing that is easily removed. 2. Leave jewelry and other valuables at home.  3. Remove all body piercing jewelry and leave at home. 4. Total time from sign-in until discharge is approximately 2-3  hours. 5. You should go home directly after your procedure and rest. You can resume normal activities the day after your procedure. 6. The day of your procedure you should not:  Drive  Make legal decisions  Operate machinery  Drink alcohol  Return to work   You may call the office (Dept: [phone]) before 5:00pm, or page the doctor on call [phone]) after 5:00pm, for further instructions, if necessary.   Insurance Information YOU WILL NEED TO CHECK WITH YOUR INSURANCE COMPANY FOR THE BENEFITS OF COVERAGE YOU HAVE FOR THIS PROCEDURE.  UNFORTUNATELY, NOT ALL INSURANCE COMPANIES HAVE BENEFITS TO COVER ALL OR PART OF THESE TYPES OF PROCEDURES.  IT IS YOUR RESPONSIBILITY TO CHECK YOUR BENEFITS, HOWEVER, WE WILL BE GLAD TO ASSIST YOU WITH ANY CODES YOUR INSURANCE COMPANY MAY NEED.    PLEASE NOTE THAT MOST INSURANCE COMPANIES WILL NOT COVER A SCREENING COLONOSCOPY FOR PEOPLE UNDER THE AGE OF 50  IF YOU HAVE BCBS INSURANCE, YOU MAY HAVE BENEFITS FOR A SCREENING COLONOSCOPY BUT IF POLYPS ARE FOUND THE DIAGNOSIS WILL CHANGE AND THEN YOU MAY HAVE A DEDUCTIBLE THAT WILL NEED TO BE MET. SO PLEASE MAKE SURE YOU CHECK YOUR BENEFITS FOR A SCREENING COLONOSCOPY AS WELL AS A DIAGNOSTIC COLONOSCOPY.

## 2018-02-18 DIAGNOSIS — Z79899 Other long term (current) drug therapy: Secondary | ICD-10-CM | POA: Diagnosis not present

## 2018-02-18 DIAGNOSIS — Z139 Encounter for screening, unspecified: Secondary | ICD-10-CM | POA: Diagnosis not present

## 2018-02-18 DIAGNOSIS — Z0130 Encounter for examination of blood pressure without abnormal findings: Secondary | ICD-10-CM | POA: Diagnosis not present

## 2018-02-18 DIAGNOSIS — E559 Vitamin D deficiency, unspecified: Secondary | ICD-10-CM | POA: Diagnosis not present

## 2018-02-25 DIAGNOSIS — Z23 Encounter for immunization: Secondary | ICD-10-CM | POA: Diagnosis not present

## 2018-03-04 DIAGNOSIS — E782 Mixed hyperlipidemia: Secondary | ICD-10-CM | POA: Diagnosis not present

## 2018-03-04 DIAGNOSIS — Z008 Encounter for other general examination: Secondary | ICD-10-CM | POA: Diagnosis not present

## 2018-03-04 DIAGNOSIS — E559 Vitamin D deficiency, unspecified: Secondary | ICD-10-CM | POA: Diagnosis not present

## 2018-03-04 DIAGNOSIS — Z719 Counseling, unspecified: Secondary | ICD-10-CM | POA: Diagnosis not present

## 2018-03-18 ENCOUNTER — Telehealth: Payer: Self-pay

## 2018-03-18 NOTE — Telephone Encounter (Signed)
[phone]  Patient went to pick up prep and it was not at the pharmacy   Please call in

## 2018-03-19 NOTE — Telephone Encounter (Signed)
Called and spoke with [facility] and he said they had it and will get it ready for him.

## 2018-03-19 NOTE — Telephone Encounter (Signed)
Pt is aware.  

## 2018-03-21 NOTE — Progress Notes (Signed)
Office Visit Note  Patient: Bobby Kramer             Date of Birth: 03-01-1971           MRN: [id number]             PCP: Jake Samples, PA-C Referring: Jake Samples, [place]* Visit Date: 04/03/2018 Occupation: @GUAROCC @  Subjective:  Medication management.   History of Present Illness: Bobby Kramer is a 47 y.o. male with history of seropositive rheumatoid arthritis.  According to patient he has been doing well on combination of methotrexate and Xeljanz.  He denies any joint pain or joint swelling.  His plantar fasciitis has improved.  The trochanteric bursitis has been recurrent although he has not had a flare since the last visit.  Activities of Daily Living:  Patient reports morning stiffness for 5 minutes.   Patient Denies nocturnal pain.  Difficulty dressing/grooming: Denies Difficulty climbing stairs: Denies Difficulty getting out of chair: Denies Difficulty using hands for taps, buttons, cutlery, and/or writing: Denies  Review of Systems  Constitutional: Positive for fatigue. Negative for night sweats.  HENT: Negative for mouth sores, mouth dryness and nose dryness.   Eyes: Negative for redness and dryness.  Respiratory: Negative for shortness of breath and difficulty breathing.   Cardiovascular: Negative for chest pain, palpitations, hypertension, irregular heartbeat and swelling in legs/feet.  Gastrointestinal: Negative for constipation and diarrhea.  Endocrine: Negative for increased urination.  Genitourinary: Negative for difficulty urinating.  Musculoskeletal: Positive for arthralgias, joint pain and morning stiffness. Negative for joint swelling, myalgias, muscle weakness, muscle tenderness and myalgias.  Skin: Negative for color change, rash, hair loss, nodules/bumps, skin tightness, ulcers and sensitivity to sunlight.  Allergic/Immunologic: Negative for susceptible to infections.  Neurological: Negative for dizziness, fainting, numbness, memory loss,  night sweats and weakness ( ).  Hematological: Negative for bruising/bleeding tendency and swollen glands.  Psychiatric/Behavioral: Negative for depressed mood and sleep disturbance. The patient is not nervous/anxious.     PMFS History:  Patient Active Problem List   Diagnosis Date Noted  . Pain in right hip 06/20/2016  . High risk medication use 05/31/2016  . History of hypertension 05/31/2016  . History of sleep apnea 05/31/2016  . HLA B27 (HLA B27 positive) 05/31/2016  . Trochanteric bursitis of both hips 05/31/2016  . OSA (obstructive sleep apnea) 01/08/2016  . Overweight 01/08/2016  . Hyperlipidemia 01/08/2016  . Rheumatoid arthritis ([place]) 01/08/2016  . Cervicalgia 11/13/2013  . Sprain of neck 11/13/2013  . HTN (hypertension) 07/28/2013  . Mild obesity 07/28/2013    Past Medical History:  Diagnosis Date  . Hypertension   . Rheumatoid arthritis ([place])   . Seasonal allergies   . Sleep apnea    uses a cpap    Family History  Problem Relation Age of Onset  . Ovarian cancer Mother   . Cancer Mother        colon   . Diabetes Father   . Heart disease Father   . Healthy Sister   . Healthy Daughter   . Healthy Daughter    Past Surgical History:  Procedure Laterality Date  . CARDIAC CATHETERIZATION  05/09/2012   EF 55% Normal Coronary Arteries  . DOPPLER ECHOCARDIOGRAPHY  09/18/2011   EF>55%  . EXCISION MORTON'S NEUROMA  03/06/2012   Procedure: EXCISION MORTON'S NEUROMA;  Surgeon: Wylene Simmer, MD;  Location: [facility];  Service: Orthopedics;  Laterality: Right;  EXCISION OF RIGHT THIRD WEB SPACE NEUROMA  HENDER RETRACTOR *ESMARK USED AS TOURNIQUET.  ON AT 2330.  OFF AT 0826.  Marland Kitchen GXT  09/18/2011  . LEFT HEART CATHETERIZATION WITH CORONARY ANGIOGRAM Bilateral 05/26/2012   Procedure: LEFT HEART CATHETERIZATION WITH CORONARY ANGIOGRAM;  Surgeon: Sanda Klein, MD;  Location: [facility];  Service: Cardiovascular;  Laterality: Bilateral;  . SYNOVECTOMY   03/06/2012   Procedure: SYNOVECTOMY;  Surgeon: Wylene Simmer, MD;  Location: [facility];  Service: Orthopedics;  Laterality: Right;  SYNOVECTOMY OF THIRD AND FOURTH METATARSAL PHALANGEAL JOINT  . WISDOM TOOTH EXTRACTION     Social History   Social History Narrative  . Not on file    Objective: Vital Signs: BP 111/67 (BP Location: Left Arm, Patient Position: Sitting, Cuff Size: Normal)   Pulse 71   Resp 16   Ht 6' 2"  (1.88 m)   Wt 235 lb (106.6 kg)   BMI 30.17 kg/m    Physical Exam  Constitutional: He is oriented to person, place, and time. He appears well-developed and well-nourished.  HENT:  Head: Normocephalic and atraumatic.  Eyes: Pupils are equal, round, and reactive to light. Conjunctivae and EOM are normal.  Neck: Normal range of motion. Neck supple.  Cardiovascular: Normal rate, regular rhythm and normal heart sounds.  Pulmonary/Chest: Effort normal and breath sounds normal.  Abdominal: Soft. Bowel sounds are normal.  Neurological: He is alert and oriented to person, place, and time.  Skin: Skin is warm and dry. Capillary refill takes less than 2 seconds.  Psychiatric: He has a normal mood and affect. His behavior is normal.  Nursing note and vitals reviewed.    Musculoskeletal Exam: C-spine thoracic lumbar spine good range of motion.  Shoulder joints elbow joints wrist joints MCPs PIPs DIPs were in good range of motion with no synovitis.  Hip joints knee joints ankles MTPs PIPs were in good range of motion with no synovitis.  CDAI Exam: CDAI Score: 0.4  Patient Global Assessment: 2 (mm); Provider Global Assessment: 2 (mm) Swollen: 0 ; Tender: 0  Joint Exam   Not documented   There is currently no information documented on the homunculus. Go to the Rheumatology activity and complete the homunculus joint exam.  Investigation: No additional findings.  Imaging: No results found.  Recent Labs: Lab Results  Component Value Date   WBC 5.9  12/11/2016   HGB 14.3 12/11/2016   PLT 236 12/11/2016   NA 139 12/11/2016   K 4.2 12/11/2016   CL 103 12/11/2016   CO2 24 12/11/2016   GLUCOSE 88 12/11/2016   BUN 13 12/11/2016   CREATININE 1.11 12/11/2016   BILITOT 0.6 12/11/2016   ALKPHOS 54 12/11/2016   AST 20 12/11/2016   ALT 22 12/11/2016   PROT 7.1 12/11/2016   ALBUMIN 4.7 12/11/2016   CALCIUM 9.3 12/11/2016   GFRAA >89 12/11/2016    Speciality Comments: No specialty comments available.  Procedures:  No procedures performed Allergies: Patient has no known allergies.   Assessment / Plan:     Visit Diagnoses: Rheumatoid arthritis involving multiple sites with positive rheumatoid factor ([place]) -patient had no synovitis on examination.  He is doing well on current combination of medications.  He has minimal morning stiffness.  High risk medication use - Current regimen includes Xeljanz XR 11 mg po qd, methotrexate 10 mg po weekly, and folic acid 1 mg po qd. Most recent TB Gold negative on 02/07/16.  CBC/CMP within normal limits on 02/18/18. Last lipid panel elevated on 11/05/17 and managed by  Dr. Carlis Abbott. Recommend annual flu and Pneumovax 23 as indicated.TB gold ordered for today. CBC/CMP standing orders for every 3 months with next labs due end of December.  HLA B27 (HLA B27 positive)  Trochanteric bursitis of both hips-he has occasional discomfort and trochanteric bursitis.  With the stretching and exercising he is doing better.  Plantar fasciitis, left-improved.  History of hypertension-his blood pressure is well controlled.  History of sleep apnea  History of hyperlipidemia -he is getting lipid panel monitored by his PCP.  Orders: Orders Placed This Encounter  Procedures  . CBC with Differential/Platelet  . COMPLETE METABOLIC PANEL WITH GFR  . QuantiFERON-TB Gold Plus   No orders of the defined types were placed in this encounter.    Follow-Up Instructions: Return in about 5 months (around 09/02/2018) for  Rheumatoid arthritis.   Bo Merino, MD  Note - This record has been created using Editor, commissioning.  Chart creation errors have been sought, but may not always  have been located. Such creation errors do not reflect on  the standard of medical care.

## 2018-03-25 ENCOUNTER — Ambulatory Visit: Payer: BLUE CROSS/BLUE SHIELD | Admitting: Rheumatology

## 2018-04-03 ENCOUNTER — Ambulatory Visit: Payer: BLUE CROSS/BLUE SHIELD | Admitting: Rheumatology

## 2018-04-03 ENCOUNTER — Encounter: Payer: Self-pay | Admitting: Rheumatology

## 2018-04-03 VITALS — BP 111/67 | HR 71 | Resp 16 | Ht 74.0 in | Wt 235.0 lb

## 2018-04-03 DIAGNOSIS — Z1589 Genetic susceptibility to other disease: Secondary | ICD-10-CM | POA: Diagnosis not present

## 2018-04-03 DIAGNOSIS — M0579 Rheumatoid arthritis with rheumatoid factor of multiple sites without organ or systems involvement: Secondary | ICD-10-CM | POA: Diagnosis not present

## 2018-04-03 DIAGNOSIS — Z8639 Personal history of other endocrine, nutritional and metabolic disease: Secondary | ICD-10-CM

## 2018-04-03 DIAGNOSIS — M7061 Trochanteric bursitis, right hip: Secondary | ICD-10-CM

## 2018-04-03 DIAGNOSIS — Z8669 Personal history of other diseases of the nervous system and sense organs: Secondary | ICD-10-CM

## 2018-04-03 DIAGNOSIS — Z79899 Other long term (current) drug therapy: Secondary | ICD-10-CM | POA: Diagnosis not present

## 2018-04-03 DIAGNOSIS — Z8679 Personal history of other diseases of the circulatory system: Secondary | ICD-10-CM

## 2018-04-03 DIAGNOSIS — M722 Plantar fascial fibromatosis: Secondary | ICD-10-CM

## 2018-04-03 DIAGNOSIS — M7062 Trochanteric bursitis, left hip: Secondary | ICD-10-CM

## 2018-04-03 NOTE — Patient Instructions (Signed)
Standing Labs We placed an order today for your standing lab work.    Please come back and get your standing labs in December and every 3 months   We have open lab Monday through Friday from 8:30-11:30 AM and 1:30-4:00 PM  at the office of Dr. Madalynn Pickelsimer.   You may experience shorter wait times on Monday and Friday afternoons. The office is located at [address], [address], [place], [name] [address] No appointment is necessary.   Labs are drawn by [facility].  You may receive a bill from [facility] for your lab work. If you have any questions regarding directions or hours of operation,  please call [phone].   Just as a reminder please drink plenty of water prior to coming for your lab work. Thanks!   

## 2018-04-04 ENCOUNTER — Ambulatory Visit (HOSPITAL_COMMUNITY)
Admission: RE | Admit: 2018-04-04 | Discharge: 2018-04-04 | Disposition: A | Discharge status: Home / Self Care | Payer: BLUE CROSS/BLUE SHIELD | Source: Ambulatory Visit | Attending: Gastroenterology | Admitting: Gastroenterology

## 2018-04-04 ENCOUNTER — Encounter (HOSPITAL_COMMUNITY): Payer: Self-pay | Admitting: *Deleted

## 2018-04-04 ENCOUNTER — Encounter (HOSPITAL_COMMUNITY)
Admission: RE | Disposition: A | Discharge status: Home / Self Care | Payer: Self-pay | Source: Ambulatory Visit | Attending: Gastroenterology

## 2018-04-04 ENCOUNTER — Other Ambulatory Visit: Payer: Self-pay

## 2018-04-04 DIAGNOSIS — K644 Residual hemorrhoidal skin tags: Secondary | ICD-10-CM | POA: Insufficient documentation

## 2018-04-04 DIAGNOSIS — Z79899 Other long term (current) drug therapy: Secondary | ICD-10-CM | POA: Diagnosis not present

## 2018-04-04 DIAGNOSIS — Q438 Other specified congenital malformations of intestine: Secondary | ICD-10-CM | POA: Diagnosis not present

## 2018-04-04 DIAGNOSIS — Z87891 Personal history of nicotine dependence: Secondary | ICD-10-CM | POA: Diagnosis not present

## 2018-04-04 DIAGNOSIS — I10 Essential (primary) hypertension: Secondary | ICD-10-CM | POA: Diagnosis not present

## 2018-04-04 DIAGNOSIS — M069 Rheumatoid arthritis, unspecified: Secondary | ICD-10-CM | POA: Diagnosis not present

## 2018-04-04 DIAGNOSIS — Z1211 Encounter for screening for malignant neoplasm of colon: Secondary | ICD-10-CM | POA: Diagnosis not present

## 2018-04-04 DIAGNOSIS — Z791 Long term (current) use of non-steroidal anti-inflammatories (NSAID): Secondary | ICD-10-CM | POA: Diagnosis not present

## 2018-04-04 DIAGNOSIS — G4730 Sleep apnea, unspecified: Secondary | ICD-10-CM | POA: Diagnosis not present

## 2018-04-04 DIAGNOSIS — Z800 Family history of malignant neoplasm of digestive organs: Secondary | ICD-10-CM | POA: Insufficient documentation

## 2018-04-04 HISTORY — PX: COLONOSCOPY: SHX5424

## 2018-04-04 SURGERY — COLONOSCOPY
Anesthesia: Moderate Sedation

## 2018-04-04 MED ORDER — MIDAZOLAM HCL 5 MG/5ML IJ SOLN
INTRAMUSCULAR | Status: AC
  Filled 2018-04-04: qty 10

## 2018-04-04 MED ORDER — MEPERIDINE HCL 100 MG/ML IJ SOLN
INTRAMUSCULAR | Status: DC | PRN
  Administered 2018-04-04: 25 mg
  Administered 2018-04-04: 50 mg
  Administered 2018-04-04: 25 mg

## 2018-04-04 MED ORDER — MIDAZOLAM HCL 5 MG/5ML IJ SOLN
INTRAMUSCULAR | Status: DC | PRN
  Administered 2018-04-04 (×3): 2 mg via INTRAVENOUS

## 2018-04-04 MED ORDER — SODIUM CHLORIDE 0.9 % IV SOLN
INTRAVENOUS | Status: DC
  Administered 2018-04-04: 11:00:00 via INTRAVENOUS

## 2018-04-04 MED ORDER — MEPERIDINE HCL 100 MG/ML IJ SOLN
INTRAMUSCULAR | Status: AC
  Filled 2018-04-04: qty 2

## 2018-04-04 NOTE — H&P (Signed)
Primary Care Physician:  Jake Samples, PA-C Primary Gastroenterologist:  Dr. Oneida Alar  Pre-Procedure History & Physical: HPI:  Bobby Kramer is a 47 y.o. male here for COLON CANCER SCREENING: FAMILY Hx COLON CA-MOTHER HAD COLON CA AGE <60.  Past Medical History:  Diagnosis Date  . Hypertension   . Rheumatoid arthritis ([place])   . Seasonal allergies   . Sleep apnea    uses a cpap    Past Surgical History:  Procedure Laterality Date  . CARDIAC CATHETERIZATION  05/09/2012   EF 55% Normal Coronary Arteries  . DOPPLER ECHOCARDIOGRAPHY  09/18/2011   EF>55%  . EXCISION MORTON'S NEUROMA  03/06/2012   Procedure: EXCISION MORTON'S NEUROMA;  Surgeon: Wylene Simmer, MD;  Location: [place];  Service: Orthopedics;  Laterality: Right;  EXCISION OF RIGHT THIRD WEB SPACE NEUROMA  HENDER RETRACTOR *[place] USED AS TOURNIQUET.  ON AT 4627.  OFF AT 0826.  Marland Kitchen GXT  09/18/2011  . LEFT HEART CATHETERIZATION WITH CORONARY ANGIOGRAM Bilateral 05/26/2012   Procedure: LEFT HEART CATHETERIZATION WITH CORONARY ANGIOGRAM;  Surgeon: Sanda Klein, MD;  Location: [place] CATH [facility];  Service: Cardiovascular;  Laterality: Bilateral;  . SYNOVECTOMY  03/06/2012   Procedure: SYNOVECTOMY;  Surgeon: Wylene Simmer, MD;  Location: [place];  Service: Orthopedics;  Laterality: Right;  SYNOVECTOMY OF THIRD AND FOURTH METATARSAL PHALANGEAL JOINT  . WISDOM TOOTH EXTRACTION      Prior to Admission medications   Medication Sig Start Date End Date Taking? Authorizing Provider  amLODipine-olmesartan (AZOR) 5-20 MG tablet Take 1 tablet daily by mouth. 04/09/17  Yes Croitoru, Mihai, MD  cholecalciferol (VITAMIN D) 1000 units tablet Take 1,000 Units by mouth daily.   Yes [provider]  folic acid (FOLVITE) 1 MG tablet Take 1 tablet (1 mg total) by mouth daily. 12/11/16  Yes Deveshwar, Abel Presto, MD  hydrochlorothiazide (MICROZIDE) 12.5 MG capsule Take 1 capsule (12.5 mg total) every other day by  mouth. 04/09/17  Yes Croitoru, Mihai, MD  ibuprofen (ADVIL,MOTRIN) 200 MG tablet Take 400 mg by mouth daily as needed for headache or moderate pain.   Yes [provider]  methotrexate (RHEUMATREX) 2.5 MG tablet TAKE 4 TABLETS BY MOUTH  ONCE A WEEK. CAUTION  CHEMOTHERAPY PROTECT FROM  LIGHT. Patient taking differently: Take 10 mg by mouth every Monday. CAUTION  CHEMOTHERAPY PROTECT FROM  LIGHT. 10/24/17  Yes Deveshwar, Abel Presto, MD  Multiple Vitamin (MULTIVITAMIN) tablet Take 1 tablet by mouth daily.   Yes [provider]  polyethylene glycol-electrolytes (TRILYTE) 420 g solution Take 4,000 mLs by mouth as directed. 02/06/18  Yes Carlis Stable, NP  XELJANZ XR 11 MG TB24 TAKE 1 TABLET BY MOUTH EVERY DAY Patient taking differently: Take 11 mg by mouth daily.  01/16/18  Yes Deveshwar, Abel Presto, MD  Na Sulfate-K Sulfate-Mg Sulf (SUPREP BOWEL PREP KIT) 17.5-3.13-1.6 GM/177ML SOLN Take 1 kit by mouth as directed. Patient not taking: Reported on 04/01/2018 02/06/18   Carlis Stable, NP    Allergies as of 02/06/2018  . (No Known Allergies)    Family History  Problem Relation Age of Onset  . Ovarian cancer Mother   . Cancer Mother        colon   . Diabetes Father   . Heart disease Father   . Healthy Sister   . Healthy Daughter   . Healthy Daughter     Social History   Socioeconomic History  . Marital status: Married    Spouse name: Not on  file  . Number of children: Not on file  . Years of education: Not on file  . Highest education level: Not on file  Occupational History  . Not on file  Social Needs  . Financial resource strain: Not on file  . Food insecurity:    Worry: Not on file    Inability: Not on file  . Transportation needs:    Medical: Not on file    Non-medical: Not on file  Tobacco Use  . Smoking status: Former Smoker    Types: Cigarettes    Last attempt to quit: 03/05/2007    Years since quitting: 11.0  . Smokeless tobacco: Never Used  . Tobacco comment:  recreational   Substance and Sexual Activity  . Alcohol use: Yes    Comment: rarely  . Drug use: Never  . Sexual activity: Not on file  Lifestyle  . Physical activity:    Days per week: Not on file    Minutes per session: Not on file  . Stress: Not on file  Relationships  . Social connections:    Talks on phone: Not on file    Gets together: Not on file    Attends religious service: Not on file    Active member of club or organization: Not on file    Attends meetings of clubs or organizations: Not on file    Relationship status: Not on file  . Intimate partner violence:    Fear of current or ex partner: Not on file    Emotionally abused: Not on file    Physically abused: Not on file    Forced sexual activity: Not on file  Other Topics Concern  . Not on file  Social History Narrative  . Not on file    Review of Systems: See HPI, otherwise negative ROS   Physical Exam: BP 125/65   Pulse 61   Temp 97.8 F (36.6 C) (Oral)   Resp 13   Ht 6' 2"  (1.88 m)   Wt 106.6 kg   SpO2 97%   BMI 30.17 kg/m  General:   Alert,  pleasant and cooperative in NAD Head:  Normocephalic and atraumatic. Neck:  Supple; Lungs:  Clear throughout to auscultation.    Heart:  Regular rate and rhythm. Abdomen:  Soft, nontender and nondistended. Normal bowel sounds, without guarding, and without rebound.   Neurologic:  Alert and  oriented x4;  grossly normal neurologically.  Impression/Plan:    SCREENING  Plan:  1. TCS TODAY DISCUSSED PROCEDURE, BENEFITS, & RISKS: < 1% chance of medication reaction, bleeding, perforation, or rupture of spleen/liver.

## 2018-04-04 NOTE — Discharge Instructions (Signed)
You have SMALL EXTERNAL hemorrhoids. YOU DID NOT HAVE ANY POLYPS.   DRINK WATER TO KEEP YOUR URINE LIGHT YELLOW.  FOLLOW A HIGH FIBER DIET. AVOID ITEMS THAT CAUSE BLOATING. SEE INFO BELOW.   USE PREPARATION H FOUR TIMES  A DAY IF NEEDED TO RELIEVE RECTAL PAIN/PRESSURE/BLEEDING.   Next colonoscopy in 5 years.  Colonoscopy Care After Read the instructions outlined below and refer to this sheet in the next week. These discharge instructions provide you with general information on caring for yourself after you leave the hospital. While your treatment has been planned according to the most current medical practices available, unavoidable complications occasionally occur. If you have any problems or questions after discharge, call DR. Debbie Yearick, [phone].  ACTIVITY  You may resume your regular activity, but move at a slower pace for the next 24 hours.   Take frequent rest periods for the next 24 hours.   Walking will help get rid of the air and reduce the bloated feeling in your belly (abdomen).   No driving for 24 hours (because of the medicine (anesthesia) used during the test).   You may shower.   Do not sign any important legal documents or operate any machinery for 24 hours (because of the anesthesia used during the test).    NUTRITION  Drink plenty of fluids.   You may resume your normal diet as instructed by your doctor.   Begin with a light meal and progress to your normal diet. Heavy or fried foods are harder to digest and may make you feel sick to your stomach (nauseated).   Avoid alcoholic beverages for 24 hours or as instructed.    MEDICATIONS  You may resume your normal medications.   WHAT YOU CAN EXPECT TODAY  Some feelings of bloating in the abdomen.   Passage of more gas than usual.   Spotting of blood in your stool or on the toilet paper  .  IF YOU HAD POLYPS REMOVED DURING THE COLONOSCOPY:  Eat a soft diet IF YOU HAVE NAUSEA, BLOATING, ABDOMINAL  PAIN, OR VOMITING.    FINDING OUT THE RESULTS OF YOUR TEST Not all test results are available during your visit. DR. Darrick Penna WILL CALL YOU WITHIN 14 DAYS OF YOUR PROCEDUE WITH YOUR RESULTS. Do not assume everything is normal if you have not heard from DR. Karmah Potocki, CALL HER OFFICE AT [phone].  SEEK IMMEDIATE MEDICAL ATTENTION AND CALL THE OFFICE: [phone] IF:  You have more than a spotting of blood in your stool.   Your belly is swollen (abdominal distention).   You are nauseated or vomiting.   You have a temperature over 101F.   You have abdominal pain or discomfort that is severe or gets worse throughout the day.  High-Fiber Diet A high-fiber diet changes your normal diet to include more whole grains, legumes, fruits, and vegetables. Changes in the diet involve replacing refined carbohydrates with unrefined foods. The calorie level of the diet is essentially unchanged. The Dietary Reference Intake (recommended amount) for adult males is 38 grams per day. For adult females, it is 25 grams per day. Pregnant and lactating women should consume 28 grams of fiber per day. Fiber is the intact part of a plant that is not broken down during digestion. Functional fiber is fiber that has been isolated from the plant to provide a beneficial effect in the body. PURPOSE  Increase stool bulk.   Ease and regulate bowel movements.   Lower cholesterol.   REDUCE RISK OF  COLON CANCER  INDICATIONS THAT YOU NEED MORE FIBER  Constipation and hemorrhoids.   Uncomplicated diverticulosis (intestine condition) and irritable bowel syndrome.   Weight management.   As a protective measure against hardening of the arteries (atherosclerosis), diabetes, and cancer.   GUIDELINES FOR INCREASING FIBER IN THE DIET  Start adding fiber to the diet slowly. A gradual increase of about 5 more grams (2 slices of whole-wheat bread, 2 servings of most fruits or vegetables, or 1 bowl of high-fiber cereal) per  day is best. Too rapid an increase in fiber may result in constipation, flatulence, and bloating.   Drink enough water and fluids to keep your urine clear or pale yellow. Water, juice, or caffeine-free drinks are recommended. Not drinking enough fluid may cause constipation.   Eat a variety of high-fiber foods rather than one type of fiber.   Try to increase your intake of fiber through using high-fiber foods rather than fiber pills or supplements that contain small amounts of fiber.   The goal is to change the types of food eaten. Do not supplement your present diet with high-fiber foods, but replace foods in your present diet.    INCLUDE A VARIETY OF FIBER SOURCES  Replace refined and processed grains with whole grains, canned fruits with fresh fruits, and incorporate other fiber sources. White rice, white breads, and most bakery goods contain little or no fiber.   Brown whole-grain rice, buckwheat oats, and many fruits and vegetables are all good sources of fiber. These include: broccoli, Brussels sprouts, cabbage, cauliflower, beets, sweet potatoes, white potatoes (skin on), carrots, tomatoes, eggplant, squash, berries, fresh fruits, and dried fruits.   Cereals appear to be the richest source of fiber. Cereal fiber is found in whole grains and bran. Bran is the fiber-rich outer coat of cereal grain, which is largely removed in refining. In whole-grain cereals, the bran remains. In breakfast cereals, the largest amount of fiber is found in those with "bran" in their names. The fiber content is sometimes indicated on the label.   You may need to include additional fruits and vegetables each day.   In baking, for 1 cup white flour, you may use the following substitutions:   1 cup whole-wheat flour minus 2 tablespoons.   1/2 cup white flour plus 1/2 cup whole-wheat flour.   Hemorrhoids Hemorrhoids are dilated (enlarged) veins around the rectum. Sometimes clots will form in the veins. This  makes them swollen and painful. These are called thrombosed hemorrhoids. Causes of hemorrhoids include:  Constipation.   Straining to have a bowel movement.   HEAVY LIFTING  HOME CARE INSTRUCTIONS  Eat a well balanced diet and drink 6 to 8 glasses of water every day to avoid constipation. You may also use a bulk laxative.   Avoid straining to have bowel movements.   Keep anal area dry and clean.   Do not use a donut shaped pillow or sit on the toilet for long periods. This increases blood pooling and pain.   Move your bowels when your body has the urge; this will require less straining and will decrease pain and pressure.

## 2018-04-04 NOTE — Op Note (Signed)
[facility] Patient Name: Bobby Kramer Procedure Date: 04/04/2018 11:25 AM MRN: [id number] Date of Birth: 03-02-1971 Attending MD: Jonette Eva MD, MD CSN: [id number] Age: 47 Admit Type: Outpatient Procedure:                Colonoscopy, SCREENING Indications:              Screening in patient at increased risk: Colorectal                            cancer in mother before age 55 Providers:                Jonette Eva MD, MD, Edrick Kins, RN, Jannett Celestine,                            RN Referring MD:             Avis Epley PA Medicines:                Meperidine 100 mg IV, Midazolam 6 mg IV Complications:            No immediate complications. Estimated Blood Loss:     Estimated blood loss: none. Procedure:                Pre-Anesthesia Assessment:                           - Prior to the procedure, a History and Physical                            was performed, and patient medications and                            allergies were reviewed. The patient's tolerance of                            previous anesthesia was also reviewed. The risks                            and benefits of the procedure and the sedation                            options and risks were discussed with the patient.                            All questions were answered, and informed consent                            was obtained. Prior Anticoagulants: The patient has                            taken no previous anticoagulant or antiplatelet                            agents. ASA Grade Assessment: II - A patient with  mild systemic disease. After reviewing the risks                            and benefits, the patient was deemed in                            satisfactory condition to undergo the procedure.                            After obtaining informed consent, the colonoscope                            was passed under direct vision. Throughout the             procedure, the patient's blood pressure, pulse, and                            oxygen saturations were monitored continuously. The                            CF-[id number] ([id number]) scope was introduced through                            the anus and advanced to the the cecum, identified                            by appendiceal orifice and ileocecal valve. The                            colonoscopy was somewhat difficult due to a                            tortuous colon. Successful completion of the                            procedure was aided by straightening and shortening                            the scope to obtain bowel loop reduction and                            COLOWRAP. The patient tolerated the procedure well.                            The quality of the bowel preparation was good. The                            ileocecal valve, appendiceal orifice, and rectum                            were photographed. Scope In: 12:06:51 PM Scope Out: 12:24:26 PM Scope Withdrawal Time: 0 hours 13 minutes 58 seconds  Total Procedure Duration: 0 hours 17 minutes 35 seconds  Findings:      External hemorrhoids  were found. The hemorrhoids were small.      The recto-sigmoid colon, sigmoid colon and descending colon were       moderately tortuous.      The exam was otherwise without abnormality. Impression:               - External hemorrhoids.                           - Tortuous colon.                           - The examination was otherwise normal. Moderate Sedation:      Moderate (conscious) sedation was administered by the endoscopy nurse       and supervised by the endoscopist. The following parameters were       monitored: oxygen saturation, heart rate, blood pressure, and response       to care. Total physician intraservice time was 32 minutes. Recommendation:           - Patient has a contact number available for                            emergencies. The signs and  symptoms of potential                            delayed complications were discussed with the                            patient. Return to normal activities tomorrow.                            Written discharge instructions were provided to the                            patient.                           - High fiber diet.                           - Continue present medications.                           - Repeat colonoscopy in 5 years for surveillance. Procedure Code(s):        --- Professional ---                           [phone], Colonoscopy, flexible; diagnostic, including                            collection of specimen(s) by brushing or washing,                            when performed (separate procedure)                           99153, Moderate sedation; each additional 15  minutes intraservice time                           G0500, Moderate sedation services provided by the                            same physician or other qualified health care                            professional performing a gastrointestinal                            endoscopic service that sedation supports,                            requiring the presence of an independent trained                            observer to assist in the monitoring of the                            patient's level of consciousness and physiological                            status; initial 15 minutes of intra-service time;                            patient age 58 years or older (additional time may                            be reported with 16109, as appropriate) Diagnosis Code(s):        --- Professional ---                           Z80.0, Family history of malignant neoplasm of                            digestive organs                           K64.4, Residual hemorrhoidal skin tags                           Q43.8, Other specified congenital malformations of                             intestine CPT copyright 2018 American Medical Association. All rights reserved. The codes documented in this report are preliminary and upon coder review may  be revised to meet current compliance requirements. Jonette Eva, MD Jonette Eva MD, MD 04/04/2018 12:35:54 PM This report has been signed electronically. Number of Addenda: 0

## 2018-04-05 LAB — QUANTIFERON-TB GOLD PLUS
NIL: 0.02 [IU]/mL
QUANTIFERON-TB GOLD PLUS: NEGATIVE
TB1-NIL: 0.01 [IU]/mL
TB2-NIL: 0.01 IU/mL

## 2018-04-08 DIAGNOSIS — Z7189 Other specified counseling: Secondary | ICD-10-CM | POA: Diagnosis not present

## 2018-04-10 ENCOUNTER — Telehealth: Payer: Self-pay | Admitting: *Deleted

## 2018-04-10 ENCOUNTER — Encounter (HOSPITAL_COMMUNITY): Payer: Self-pay | Admitting: Gastroenterology

## 2018-04-10 NOTE — Telephone Encounter (Signed)
Received results for cholesterol panel drawn on 03/06/18. Patient is on Xeljanz 11 mg daily, MTX 4 tabs weekly  Previous Cholesterol 229 now 238, Triglycerides 300 now 156 LDL 130 now 154.  Per Dr. Corliss Skains should discuss with PCP.

## 2018-04-14 NOTE — Telephone Encounter (Signed)
Attempted to contact the patient and left message for patient to call the office.  

## 2018-04-21 ENCOUNTER — Other Ambulatory Visit: Payer: Self-pay | Admitting: Rheumatology

## 2018-04-21 NOTE — Telephone Encounter (Signed)
Last Visit: 04/03/2018 Next Visit: 09/02/2018 Labs: 02/18/2018  TB Gold: 04/03/2018 negative   Okay to refill per Dr. Corliss Skains.

## 2018-04-23 ENCOUNTER — Other Ambulatory Visit: Payer: Self-pay | Admitting: Rheumatology

## 2018-04-28 NOTE — Telephone Encounter (Signed)
Last Visit: 04/03/2018 Next Visit: 09/02/2018 Labs: 02/18/2018   Okay to refill per Dr. Corliss Skains

## 2018-05-24 ENCOUNTER — Other Ambulatory Visit: Payer: Self-pay | Admitting: Cardiovascular Disease

## 2018-06-23 NOTE — Telephone Encounter (Signed)
Received a fax from [facility] regarding a prior authorization for [facility]. Authorization has been APPROVED from 06/23/2018 to 06/24/2019.   Will send document to scan center.  Authorization # [id number] Phone # [phone]

## 2018-06-23 NOTE — Telephone Encounter (Signed)
Received a Prior Authorization request from [employer] for [facility]. Authorization has been submitted to patient's insurance via Cover My Meds. Will update once we receive a response.

## 2018-06-30 ENCOUNTER — Other Ambulatory Visit: Payer: Self-pay | Admitting: Rheumatology

## 2018-06-30 NOTE — Telephone Encounter (Signed)
Last Visit: 04/03/2018 Next Visit: 09/02/2018 Labs:02/18/2018  Patient advised he will update labs at work in 1 week.   Okay to refill 30 day supply per Dr. Corliss Skains

## 2018-07-01 DIAGNOSIS — L570 Actinic keratosis: Secondary | ICD-10-CM | POA: Diagnosis not present

## 2018-07-01 DIAGNOSIS — B079 Viral wart, unspecified: Secondary | ICD-10-CM | POA: Diagnosis not present

## 2018-07-22 DIAGNOSIS — Z0130 Encounter for examination of blood pressure without abnormal findings: Secondary | ICD-10-CM | POA: Diagnosis not present

## 2018-07-22 DIAGNOSIS — E782 Mixed hyperlipidemia: Secondary | ICD-10-CM | POA: Diagnosis not present

## 2018-07-22 DIAGNOSIS — Z139 Encounter for screening, unspecified: Secondary | ICD-10-CM | POA: Diagnosis not present

## 2018-07-22 DIAGNOSIS — Z79899 Other long term (current) drug therapy: Secondary | ICD-10-CM | POA: Diagnosis not present

## 2018-07-29 DIAGNOSIS — B070 Plantar wart: Secondary | ICD-10-CM | POA: Diagnosis not present

## 2018-07-31 ENCOUNTER — Other Ambulatory Visit: Payer: Self-pay | Admitting: Rheumatology

## 2018-07-31 NOTE — Telephone Encounter (Signed)
We need to get labs prior to refill on Xeljanz.  If patient does not respond please send him a letter.

## 2018-07-31 NOTE — Telephone Encounter (Addendum)
Last Visit:04/03/2018 Next Visit:09/02/2018 Labs: 9/24/2019WNL   Attempted to contact the patient and left message to advise patient he is due for labs.   Okay to refill 30 day supply Bobby Kramer?

## 2018-08-05 DIAGNOSIS — E782 Mixed hyperlipidemia: Secondary | ICD-10-CM | POA: Diagnosis not present

## 2018-08-05 DIAGNOSIS — E559 Vitamin D deficiency, unspecified: Secondary | ICD-10-CM | POA: Diagnosis not present

## 2018-08-05 DIAGNOSIS — I10 Essential (primary) hypertension: Secondary | ICD-10-CM | POA: Diagnosis not present

## 2018-08-05 DIAGNOSIS — Z008 Encounter for other general examination: Secondary | ICD-10-CM | POA: Diagnosis not present

## 2018-08-06 ENCOUNTER — Other Ambulatory Visit: Payer: Self-pay | Admitting: Rheumatology

## 2018-08-06 NOTE — Telephone Encounter (Signed)
Last Visit:04/03/2018 Next Visit:09/02/2018 Labs:07/22/18 WBC 3.7, ABS, Lymphs 847 Elevated cholesterol, total 230, Triglycerides 197   Okay to refill per Dr. Deveshwar  

## 2018-08-12 ENCOUNTER — Telehealth: Payer: Self-pay | Admitting: Rheumatology

## 2018-08-12 ENCOUNTER — Other Ambulatory Visit: Payer: Self-pay | Admitting: Cardiovascular Disease

## 2018-08-12 ENCOUNTER — Other Ambulatory Visit: Payer: Self-pay | Admitting: Rheumatology

## 2018-08-12 NOTE — Telephone Encounter (Signed)
Patient left a voicemail requesting a return call to let him know if he should continue taking his medication.  Patient states "he is looking for guidance."

## 2018-08-12 NOTE — Telephone Encounter (Signed)
Last Visit:04/03/2018 Next Visit:09/02/2018 Labs:07/22/18 WBC 3.7, ABS, Lymphs 847 Elevated cholesterol, total 230, Triglycerides 197   Okay to refill per Dr. Corliss Skains

## 2018-08-12 NOTE — Telephone Encounter (Signed)
° ° °*  STAT* If patient is at the pharmacy, call can be transferred to refill team.   1. Which medications need to be refilled? (please list name of each medication and dose if known) amLODipine-olmesartan (AZOR) 5-20 MG tablet, and hydrochlorothiazide (MICROZIDE) 12.5 MG capsule  2. Which pharmacy/location (including street and city if local pharmacy) is medication to be sent to? [facility]  3. Do they need a 30 day or 90 day supply? 90

## 2018-08-12 NOTE — Telephone Encounter (Signed)
Patient regarding your medications we recommend you continue them at this time as prescribed. In the event you do become sick please follow recommended procedure to hold medication until symptoms have resolved.  We understand concerns about increased risk of infection. If you do choose to stop your medications understand you are putting yourself at risk for a flare. Please call the office for any questions or acute issues. We are allowing some flexibly for labs and follow up visit. If you are having current respiratory symptoms do not come to the office, please utilize e-visits and your PCP for appropriate assessment and testing.

## 2018-08-14 ENCOUNTER — Other Ambulatory Visit: Payer: Self-pay | Admitting: *Deleted

## 2018-08-21 ENCOUNTER — Telehealth: Payer: Self-pay | Admitting: Rheumatology

## 2018-08-21 NOTE — Telephone Encounter (Signed)
Letter has been faxed to number provided by patient. Attempted to contact patient and left message on machine to advise patient that letter has been faxed.

## 2018-08-21 NOTE — Telephone Encounter (Signed)
Patient called stating he works at [employer] and is requesting a letter stating due to his immune system he should be given PPE to wear at work.  Patient requests the note be faxed to his employer.  Fax #[phone] Attn: Clent Jacks, Nurse

## 2018-09-02 ENCOUNTER — Ambulatory Visit: Payer: BLUE CROSS/BLUE SHIELD | Admitting: Rheumatology

## 2018-09-15 ENCOUNTER — Other Ambulatory Visit: Payer: Self-pay | Admitting: Cardiovascular Disease

## 2018-09-15 ENCOUNTER — Other Ambulatory Visit: Payer: Self-pay | Admitting: Rheumatology

## 2018-09-17 ENCOUNTER — Other Ambulatory Visit: Payer: Self-pay

## 2018-09-17 MED ORDER — AMLODIPINE-OLMESARTAN 5-20 MG PO TABS: 1.0000 | ORAL_TABLET | Freq: Every day | ORAL | 2 refills | Status: DC

## 2018-09-18 ENCOUNTER — Other Ambulatory Visit: Payer: Self-pay

## 2018-09-18 MED ORDER — AMLODIPINE-OLMESARTAN 5-20 MG PO TABS: 1.0000 | ORAL_TABLET | Freq: Every day | ORAL | 2 refills | Status: DC

## 2018-10-08 DIAGNOSIS — B079 Viral wart, unspecified: Secondary | ICD-10-CM | POA: Diagnosis not present

## 2018-10-13 ENCOUNTER — Other Ambulatory Visit: Payer: Self-pay | Admitting: Cardiovascular Disease

## 2018-10-13 ENCOUNTER — Other Ambulatory Visit: Payer: Self-pay | Admitting: Rheumatology

## 2018-10-14 ENCOUNTER — Other Ambulatory Visit: Payer: Self-pay | Admitting: Rheumatology

## 2018-10-14 NOTE — Telephone Encounter (Signed)
Last Visit:04/03/2018 Next Visit:11/03/2018 Labs:07/22/18 WBC 3.7, ABS, Lymphs 847 Elevated cholesterol, total 230, Triglycerides 197   Patient reminded he is due to update labs this month.   Okay to refill per Dr. Deveshwar 

## 2018-10-14 NOTE — Telephone Encounter (Signed)
Last Visit:04/03/2018 Next Visit:11/03/2018 Labs:07/22/18 WBC 3.7, ABS, Lymphs 847 Elevated cholesterol, total 230, Triglycerides 197   Patient reminded he is due to update labs this month.   Okay to refill per Dr. Corliss Skains

## 2018-10-15 ENCOUNTER — Other Ambulatory Visit: Payer: Self-pay

## 2018-10-15 MED ORDER — AMLODIPINE-OLMESARTAN 5-20 MG PO TABS: 1.0000 | ORAL_TABLET | Freq: Every day | ORAL | 0 refills | Status: DC

## 2018-10-21 NOTE — Progress Notes (Deleted)
Office Visit Note  Patient: Bobby Kramer             Date of Birth: 1971-04-29           MRN: [id number]             PCP: Jake Samples, PA-C Referring: Jake Samples, [place]* Visit Date: 11/03/2018 Occupation: @GUAROCC @  Subjective:  No chief complaint on file.  He is on Xeljanz XR 11 mg daily, methotrexate 4 tablets every 7 days, and folic acid 1 mg daily.  Last TB gold negative on 04/03/2018 and will monitor yearly.  Last lipid panel showed elevated LDL at 144 on 07/22/2018 and will monitor yearly.Marland Kitchen  He is not currently on cholesterol medication.  Most recent CBC/CMP within normal limits except for low WBC at 3.7 on 07/22/2018.  Due for CBC/CMP today and will monitor every 3 months.  Standing orders are in place.  Recommend annual flu, Prevnar 13, Pneumovax 23 vaccines as indicated.  History of Present Illness: Bobby Kramer is a 48 y.o. male ***   Activities of Daily Living:  Patient reports morning stiffness for *** {minute/hour:19697}.   Patient {ACTIONS;DENIES/REPORTS:21021675::"Denies"} nocturnal pain.  Difficulty dressing/grooming: {ACTIONS;DENIES/REPORTS:21021675::"Denies"} Difficulty climbing stairs: {ACTIONS;DENIES/REPORTS:21021675::"Denies"} Difficulty getting out of chair: {ACTIONS;DENIES/REPORTS:21021675::"Denies"} Difficulty using hands for taps, buttons, cutlery, and/or writing: {ACTIONS;DENIES/REPORTS:21021675::"Denies"}  No Rheumatology ROS completed.   PMFS History:  Patient Active Problem List   Diagnosis Date Noted  . Special screening for malignant neoplasms, colon   . Pain in right hip 06/20/2016  . High risk medication use 05/31/2016  . History of hypertension 05/31/2016  . History of sleep apnea 05/31/2016  . HLA B27 (HLA B27 positive) 05/31/2016  . Trochanteric bursitis of both hips 05/31/2016  . OSA (obstructive sleep apnea) 01/08/2016  . Overweight 01/08/2016  . Hyperlipidemia 01/08/2016  . Rheumatoid arthritis ([place]) 01/08/2016  .  Cervicalgia 11/13/2013  . Sprain of neck 11/13/2013  . HTN (hypertension) 07/28/2013  . Mild obesity 07/28/2013    Past Medical History:  Diagnosis Date  . Hypertension   . Rheumatoid arthritis ([place])   . Seasonal allergies   . Sleep apnea    uses a cpap    Family History  Problem Relation Age of Onset  . Ovarian cancer Mother   . Cancer Mother        colon   . Diabetes Father   . Heart disease Father   . Healthy Sister   . Healthy Daughter   . Healthy Daughter    Past Surgical History:  Procedure Laterality Date  . CARDIAC CATHETERIZATION  05/09/2012   EF 55% Normal Coronary Arteries  . COLONOSCOPY N/A 04/04/2018   Procedure: COLONOSCOPY;  Surgeon: Danie Binder, MD;  [facility];  Service: Endoscopy;  Laterality: N/A;  1:00 - pt knows to arrive at 10:30  . DOPPLER ECHOCARDIOGRAPHY  09/18/2011   EF>55%  . EXCISION MORTON'S NEUROMA  03/06/2012   Procedure: EXCISION MORTON'S NEUROMA;  Surgeon: Wylene Simmer, MD;  Location: [facility];  Service: Orthopedics;  Laterality: Right;  EXCISION OF RIGHT THIRD WEB SPACE NEUROMA  HENDER RETRACTOR *[place] USED AS TOURNIQUET.  ON AT 1287.  OFF AT 0826.  Marland Kitchen GXT  09/18/2011  . LEFT HEART CATHETERIZATION WITH CORONARY ANGIOGRAM Bilateral 05/26/2012   Procedure: LEFT HEART CATHETERIZATION WITH CORONARY ANGIOGRAM;  Surgeon: Sanda Klein, MD;  Location: [facility];  Service: Cardiovascular;  Laterality: Bilateral;  . SYNOVECTOMY  03/06/2012   Procedure: SYNOVECTOMY;  Surgeon: Wylene Simmer, MD;  Location: [place];  Service: Orthopedics;  Laterality: Right;  SYNOVECTOMY OF THIRD AND FOURTH METATARSAL PHALANGEAL JOINT  . WISDOM TOOTH EXTRACTION     Social History   Social History Narrative  . Not on file   Immunization History  Administered Date(s) Administered  . Influenza-Unspecified 02/26/2015     Objective: Vital Signs: There were no vitals taken for this visit.   Physical Exam    Musculoskeletal Exam: ***  CDAI Exam: CDAI Score: Not documented Patient Global Assessment: Not documented; Provider Global Assessment: Not documented Swollen: Not documented; Tender: Not documented Joint Exam   Not documented   There is currently no information documented on the homunculus. Go to the Rheumatology activity and complete the homunculus joint exam.  Investigation: No additional findings.  Imaging: No results found.  Recent Labs: Lab Results  Component Value Date   WBC 5.9 12/11/2016   HGB 14.3 12/11/2016   PLT 236 12/11/2016   NA 139 12/11/2016   K 4.2 12/11/2016   CL 103 12/11/2016   CO2 24 12/11/2016   GLUCOSE 88 12/11/2016   BUN 13 12/11/2016   CREATININE 1.11 12/11/2016   BILITOT 0.6 12/11/2016   ALKPHOS 54 12/11/2016   AST 20 12/11/2016   ALT 22 12/11/2016   PROT 7.1 12/11/2016   ALBUMIN 4.7 12/11/2016   CALCIUM 9.3 12/11/2016   GFRAA >89 12/11/2016   QFTBGOLDPLUS NEGATIVE 04/03/2018    Speciality Comments: No specialty comments available.  Procedures:  No procedures performed Allergies: Patient has no known allergies.   Assessment / Plan:     Visit Diagnoses: No diagnosis found.   Orders: No orders of the defined types were placed in this encounter.  No orders of the defined types were placed in this encounter.   Face-to-face time spent with patient was *** minutes. Greater than 50% of time was spent in counseling and coordination of care.  Follow-Up Instructions: No follow-ups on file.   Earnestine Mealing, CMA  Note - This record has been created using Editor, commissioning.  Chart creation errors have been sought, but may not always  have been located. Such creation errors do not reflect on  the standard of medical care.

## 2018-10-28 DIAGNOSIS — E782 Mixed hyperlipidemia: Secondary | ICD-10-CM | POA: Diagnosis not present

## 2018-10-28 DIAGNOSIS — Z79899 Other long term (current) drug therapy: Secondary | ICD-10-CM | POA: Diagnosis not present

## 2018-10-29 NOTE — Progress Notes (Signed)
Office Visit Note  Patient: Bobby Kramer             Date of Birth: [birth year]           MRN: [id number]             PCP: Jake Samples, PA-C Referring: Jake Samples, PA* Visit Date: 10/31/2018 Occupation: _0 @  Subjective:  Pain in both hands   History of Present Illness: Bobby Kramer is a 48 y.o. male with history of seropositive rheumatoid arthritis.  He is taking Xeljanz 11 mg XR and MTX 10 mg po once weekly, and folic acid 1 mg po daily. He occasionally misses MTX does but has not missed any doses of Xeljanz recently. He reports 2-3 weeks ago he developed right wrist pain that resolved on its own after 1 week.  He did not have any wrist joitn swelling at that time, and he has no discomfort or swelling currently.   He states back in early March he was playing basketball and jammed his right 5th digit, and he has had discomfort in the DIP joint of the left 5th digit since then.  He has also been having discomfort in the 5th DIP of the left digit.  He denies any joint swelling.  He denies any other joint pain or joint swelling.  He states his plantar fasciitis and bilateral trochanteric bursitis has resolved.     Activities of Daily Living:  Patient reports morning stiffness for 10 minutes.   Patient Denies nocturnal pain.  Difficulty dressing/grooming: Denies Difficulty climbing stairs: Denies Difficulty getting out of chair: Denies Difficulty using hands for taps, buttons, cutlery, and/or writing: Denies  Review of Systems  Constitutional: Negative for fatigue and night sweats.  HENT: Negative for mouth sores, mouth dryness and nose dryness.   Eyes: Negative for photophobia, redness, visual disturbance and dryness.  Respiratory: Negative for cough, hemoptysis, shortness of breath and difficulty breathing.   Cardiovascular: Negative for chest pain, palpitations, hypertension, irregular heartbeat and swelling in legs/feet.  Gastrointestinal: Negative for blood in  stool, constipation and diarrhea.  Endocrine: Negative for increased urination.  Genitourinary: Negative for painful urination.  Musculoskeletal: Positive for arthralgias, joint pain and morning stiffness. Negative for joint swelling, myalgias, muscle weakness, muscle tenderness and myalgias.  Skin: Negative for color change, rash, hair loss, nodules/bumps, skin tightness, ulcers and sensitivity to sunlight.  Allergic/Immunologic: Negative for susceptible to infections.  Neurological: Negative for dizziness, fainting, memory loss, night sweats and weakness.  Hematological: Negative for swollen glands.  Psychiatric/Behavioral: Negative for depressed mood and sleep disturbance. The patient is not nervous/anxious.     PMFS History:  Patient Active Problem List   Diagnosis Date Noted  . Special screening for malignant neoplasms, colon   . Pain in right hip 06/20/2016  . High risk medication use 05/31/2016  . History of hypertension 05/31/2016  . History of sleep apnea 05/31/2016  . HLA B27 (HLA B27 positive) 05/31/2016  . Trochanteric bursitis of both hips 05/31/2016  . OSA (obstructive sleep apnea) 01/08/2016  . Overweight 01/08/2016  . Hyperlipidemia 01/08/2016  . Rheumatoid arthritis ([place]) 01/08/2016  . Cervicalgia 11/13/2013  . Sprain of neck 11/13/2013  . HTN (hypertension) 07/28/2013  . Mild obesity 07/28/2013    Past Medical History:  Diagnosis Date  . Hypertension   . Rheumatoid arthritis ([place])   . Seasonal allergies   . Sleep apnea    uses a cpap    Family History  Problem Relation  Age of Onset  . Ovarian cancer Mother   . Cancer Mother        colon   . Diabetes Father   . Heart disease Father   . Healthy Sister   . Healthy Daughter   . Healthy Daughter    Past Surgical History:  Procedure Laterality Date  . CARDIAC CATHETERIZATION  05/09/2012   EF 55% Normal Coronary Arteries  . COLONOSCOPY N/A 04/04/2018   Procedure: COLONOSCOPY;  Surgeon: Danie Binder,  MD;  [facility];  Service: Endoscopy;  Laterality: N/A;  1:00 - pt knows to arrive at 10:30  . DOPPLER ECHOCARDIOGRAPHY  09/18/2011   EF>55%  . EXCISION MORTON'S NEUROMA  03/06/2012   Procedure: EXCISION MORTON'S NEUROMA;  Surgeon: Wylene Simmer, MD;  Location: [facility];  Service: Orthopedics;  Laterality: Right;  EXCISION OF RIGHT THIRD WEB SPACE NEUROMA  HENDER RETRACTOR *[place] USED AS TOURNIQUET.  ON AT 2119.  OFF AT 0826.  Marland Kitchen GXT  09/18/2011  . LEFT HEART CATHETERIZATION WITH CORONARY ANGIOGRAM Bilateral 05/26/2012   Procedure: LEFT HEART CATHETERIZATION WITH CORONARY ANGIOGRAM;  Surgeon: Sanda Klein, MD;  Location: [facility];  Service: Cardiovascular;  Laterality: Bilateral;  . SYNOVECTOMY  03/06/2012   Procedure: SYNOVECTOMY;  Surgeon: Wylene Simmer, MD;  Location: [facility];  Service: Orthopedics;  Laterality: Right;  SYNOVECTOMY OF THIRD AND FOURTH METATARSAL PHALANGEAL JOINT  . WISDOM TOOTH EXTRACTION     Social History   Social History Narrative  . Not on file   Immunization History  Administered Date(s) Administered  . Influenza-Unspecified 02/26/2015     Objective: Vital Signs: BP 137/75 (BP Location: Left Arm, Patient Position: Sitting, Cuff Size: Normal)   Pulse 63   Resp 15   Ht _0  (1.88 m)   Wt 234 lb (106.1 kg)   BMI 30.04 kg/m    Physical Exam Vitals signs and nursing note reviewed.  Constitutional:      Appearance: He is well-developed.  HENT:     Head: Normocephalic and atraumatic.  Eyes:     Conjunctiva/sclera: Conjunctivae normal.     Pupils: Pupils are equal, round, and reactive to light.  Neck:     Musculoskeletal: Normal range of motion and neck supple.  Cardiovascular:     Rate and Rhythm: Normal rate and regular rhythm.     Heart sounds: Normal heart sounds.  Pulmonary:     Effort: Pulmonary effort is normal.     Breath sounds: Normal breath sounds.  Abdominal:     General: Bowel sounds are  normal.     Palpations: Abdomen is soft.  Skin:    General: Skin is warm and dry.     Capillary Refill: Capillary refill takes less than 2 seconds.  Neurological:     Mental Status: He is alert and oriented to person, place, and time.  Psychiatric:        Behavior: Behavior normal.      Musculoskeletal Exam:C-spine, thoracic spine, and lumbar spine good ROM.  No midline spinal tenderness.  No SI joint tenderness.  Shoulder joints, elbow joints, wrist joints, MCPs, PIPs, and DIPs good ROM with no synovitis.  Complete fist formation bilaterally.  DIP synovial thickening.  Hip joints, knee joints, ankle joints, MTPs, PIPs, and DIPs good ROM with no synovitis.  No warmth or effusion of knee joints. No tenderness or swelling of ankle joints.  No tenderness over trochanteric bursa bilaterally.   CDAI Exam: CDAI Score:  Not documented Patient Global Assessment: Not documented; Provider Global Assessment: Not documented Swollen: Not documented; Tender: Not documented Joint Exam   Not documented   There is currently no information documented on the homunculus. Go to the Rheumatology activity and complete the homunculus joint exam.  Investigation: No additional findings.  Imaging: No results found.  Recent Labs: Lab Results  Component Value Date   WBC 5.9 12/11/2016   HGB 14.3 12/11/2016   PLT 236 12/11/2016   NA 139 12/11/2016   K 4.2 12/11/2016   CL 103 12/11/2016   CO2 24 12/11/2016   GLUCOSE 88 12/11/2016   BUN 13 12/11/2016   CREATININE 1.11 12/11/2016   BILITOT 0.6 12/11/2016   ALKPHOS 54 12/11/2016   AST 20 12/11/2016   ALT 22 12/11/2016   PROT 7.1 12/11/2016   ALBUMIN 4.7 12/11/2016   CALCIUM 9.3 12/11/2016   GFRAA >89 12/11/2016   QFTBGOLDPLUS NEGATIVE 04/03/2018    Speciality Comments: No specialty comments available.  Procedures:  No procedures performed Allergies: Patient has no known allergies.   Assessment / Plan:     Visit Diagnoses: Rheumatoid  arthritis involving multiple sites with positive rheumatoid factor ([address]): He has no synovitis on exam.  He has not had any recent rheumatoid arthritis flares.  He has been having some increased discomfort in bilateral hands.  He has synovial thickening in the DIP joints.  No MCP joint tenderness or synovitis noted.  He has complete fist formation bilaterally.  He reports about 2 to 3 weeks ago he developed pain in the right wrist joint but did not have any joint swelling at that time.  He was encouraged to use voltaren gel topically prn for pain relief.  He was also given a handout of hand exercises.  His discomfort resolved in about 1 week.  He continues to take Xeljanz 11 mg XR daily and methotrexate 10 mg by mouth once weekly.  He will continue on his current treatment regimen.  He does not need any refills at this time.  He will follow-up in the office in 5 months  High risk medication use - Xeljanz XR 11 mg po qd, methotrexate 10 mg po weekly, and folic acid 1 mg po qd. TB gold negative 04/03/2018.  White blood cell count was 3.7 on 07/22/2018.  He had labs work drawn on 10/27/18, which he will have faxed to our office.  HLA B27 (HLA B27 positive)  Trochanteric bursitis of both hips: Resolved.  He has no tenderness on exam today.  Plantar fasciitis, left: Resolved.   Other medical conditions are listed as follows:   History of hypertension  History of sleep apnea  Mixed hyperlipidemia  OSA (obstructive sleep apnea)   Orders: No orders of the defined types were placed in this encounter.  No orders of the defined types were placed in this encounter.     Follow-Up Instructions: Return in about 5 months (around 04/02/2019) for Rheumatoid arthritis.   Ofilia Neas, PA-C   I examined and evaluated the patient with Hazel Sams PA.  He had no synovitis on examination.  He states he had a mild flare of plantar fasciitis after the last visit which is resolved completely.  The plan of care was  discussed as noted above.  Bo Merino, MD  Note - This record has been created using Editor, commissioning.  Chart creation errors have been sought, but may not always  have been located. Such creation errors do not reflect on  the standard of medical care. 

## 2018-10-31 ENCOUNTER — Ambulatory Visit: Payer: BLUE CROSS/BLUE SHIELD | Admitting: Rheumatology

## 2018-10-31 ENCOUNTER — Encounter: Payer: Self-pay | Admitting: Rheumatology

## 2018-10-31 ENCOUNTER — Other Ambulatory Visit: Payer: Self-pay

## 2018-10-31 VITALS — BP 137/75 | HR 63 | Resp 15 | Ht 74.0 in | Wt 234.0 lb

## 2018-10-31 DIAGNOSIS — Z8669 Personal history of other diseases of the nervous system and sense organs: Secondary | ICD-10-CM

## 2018-10-31 DIAGNOSIS — E782 Mixed hyperlipidemia: Secondary | ICD-10-CM

## 2018-10-31 DIAGNOSIS — Z79899 Other long term (current) drug therapy: Secondary | ICD-10-CM | POA: Diagnosis not present

## 2018-10-31 DIAGNOSIS — Z1589 Genetic susceptibility to other disease: Secondary | ICD-10-CM | POA: Diagnosis not present

## 2018-10-31 DIAGNOSIS — M0579 Rheumatoid arthritis with rheumatoid factor of multiple sites without organ or systems involvement: Secondary | ICD-10-CM | POA: Diagnosis not present

## 2018-10-31 DIAGNOSIS — M7061 Trochanteric bursitis, right hip: Secondary | ICD-10-CM

## 2018-10-31 DIAGNOSIS — M722 Plantar fascial fibromatosis: Secondary | ICD-10-CM

## 2018-10-31 DIAGNOSIS — Z8679 Personal history of other diseases of the circulatory system: Secondary | ICD-10-CM

## 2018-10-31 DIAGNOSIS — M7062 Trochanteric bursitis, left hip: Secondary | ICD-10-CM

## 2018-10-31 DIAGNOSIS — G4733 Obstructive sleep apnea (adult) (pediatric): Secondary | ICD-10-CM

## 2018-10-31 NOTE — Patient Instructions (Signed)

## 2018-11-03 ENCOUNTER — Ambulatory Visit: Payer: Self-pay | Admitting: Rheumatology

## 2018-11-10 ENCOUNTER — Telehealth: Payer: Self-pay

## 2018-11-10 NOTE — Telephone Encounter (Signed)
Labs received via fax.   Cholesterol, total 219 Triglycerides 244 LDL-cholesterol 130 Non HDL cholesterol 169  CBC, CMP WNL   Attempted to contact patient and left message on machine to advise patient to follow up with PCP regarding lipid panel.   Will send documents to scan center.

## 2018-12-16 DIAGNOSIS — Z79899 Other long term (current) drug therapy: Secondary | ICD-10-CM | POA: Diagnosis not present

## 2018-12-25 ENCOUNTER — Other Ambulatory Visit: Payer: Self-pay | Admitting: Cardiovascular Disease

## 2018-12-25 ENCOUNTER — Other Ambulatory Visit: Payer: Self-pay | Admitting: Rheumatology

## 2018-12-26 NOTE — Telephone Encounter (Signed)
Last Visit: 10/31/18 Next Visit: 04/08/19 Labs: 10/28/18 cbc/cmp WNL  Okay to refill per Dr. Estanislado Pandy

## 2019-01-07 ENCOUNTER — Other Ambulatory Visit: Payer: Self-pay | Admitting: Rheumatology

## 2019-01-07 NOTE — Telephone Encounter (Signed)
Last Visit: 10/31/18 Next Visit: 04/08/19 Labs: 10/28/18 cbc/cmp WNL  Okay to refill per Dr. Deveshwar  

## 2019-01-27 DIAGNOSIS — Z139 Encounter for screening, unspecified: Secondary | ICD-10-CM | POA: Diagnosis not present

## 2019-01-27 DIAGNOSIS — E559 Vitamin D deficiency, unspecified: Secondary | ICD-10-CM | POA: Diagnosis not present

## 2019-01-27 DIAGNOSIS — Z79899 Other long term (current) drug therapy: Secondary | ICD-10-CM | POA: Diagnosis not present

## 2019-01-27 DIAGNOSIS — Z0130 Encounter for examination of blood pressure without abnormal findings: Secondary | ICD-10-CM | POA: Diagnosis not present

## 2019-01-27 DIAGNOSIS — E782 Mixed hyperlipidemia: Secondary | ICD-10-CM | POA: Diagnosis not present

## 2019-02-12 ENCOUNTER — Other Ambulatory Visit: Payer: Self-pay | Admitting: Rheumatology

## 2019-02-12 ENCOUNTER — Other Ambulatory Visit: Payer: Self-pay | Admitting: Cardiovascular Disease

## 2019-02-12 DIAGNOSIS — Z79899 Other long term (current) drug therapy: Secondary | ICD-10-CM | POA: Diagnosis not present

## 2019-02-12 DIAGNOSIS — R7301 Impaired fasting glucose: Secondary | ICD-10-CM | POA: Diagnosis not present

## 2019-02-12 DIAGNOSIS — E782 Mixed hyperlipidemia: Secondary | ICD-10-CM | POA: Diagnosis not present

## 2019-02-12 DIAGNOSIS — E559 Vitamin D deficiency, unspecified: Secondary | ICD-10-CM | POA: Diagnosis not present

## 2019-02-13 NOTE — Telephone Encounter (Signed)
Last Visit:10/31/18 Next Visit:04/08/19 Labs:01/27/19 Glucose 101 all other labs WNL  Okay to refill per Dr. Estanislado Pandy

## 2019-03-10 DIAGNOSIS — Z23 Encounter for immunization: Secondary | ICD-10-CM | POA: Diagnosis not present

## 2019-03-25 NOTE — Progress Notes (Signed)
Office Visit Note  Patient: Bobby Kramer             Date of Birth: Mar 05, 1971           MRN: [id number]             PCP: Bobby Samples, PA-C Referring: Bobby Samples, PA* Visit Date: 04/08/2019 Occupation: @GUAROCC @  Subjective:  Pain in both hands    History of Present Illness: Bobby Kramer is a 48 y.o. male with history of seropositive rheumatoid arthritis.  Patient is taking Xeljanz 11 mg XR daily, methotrexate 4 tablets by mouth once weekly, and folic acid 1 mg by mouth daily.  He states that last week he developed a sore throat and held his dose of Xeljanz for 3 days but has resumed.  He denies any recent rheumatoid arthritis flares.  He has been having increased pain in both hands especially in the DIP joints.  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  He denies any new overuse activities.  He denies any joint swelling.  He uses Voltaren gel as needed for pain relief.  He denies any other joint pain or joint swelling at this time.   Activities of Daily Living:  Patient reports morning stiffness for 5 minutes.   Patient Denies nocturnal pain.  Difficulty dressing/grooming: Denies Difficulty climbing stairs: Denies Difficulty getting out of chair: Denies Difficulty using hands for taps, buttons, cutlery, and/or writing: Denies  Review of Systems  Constitutional: Negative for fatigue and night sweats.  HENT: Negative for mouth sores, mouth dryness and nose dryness.   Eyes: Negative for redness, visual disturbance and dryness.  Respiratory: Negative for cough, hemoptysis, shortness of breath and difficulty breathing.   Cardiovascular: Negative for chest pain, palpitations, hypertension, irregular heartbeat and swelling in legs/feet.  Gastrointestinal: Negative for blood in stool, constipation and diarrhea.  Endocrine: Negative for increased urination.  Genitourinary: Negative for painful urination.  Musculoskeletal:  Positive for arthralgias, joint pain and morning stiffness. Negative for joint swelling, myalgias, muscle weakness, muscle tenderness and myalgias.  Skin: Negative for color change, rash, hair loss, nodules/bumps, skin tightness, ulcers and sensitivity to sunlight.  Allergic/Immunologic: Negative for susceptible to infections.  Neurological: Negative for dizziness, fainting, memory loss, night sweats and weakness.  Hematological: Negative for swollen glands.  Psychiatric/Behavioral: Negative for depressed mood and sleep disturbance. The patient is not nervous/anxious.     PMFS History:  Patient Active Problem List   Diagnosis Date Noted  . Special screening for malignant neoplasms, colon   . Pain in right hip 06/20/2016  . High risk medication use 05/31/2016  . History of hypertension 05/31/2016  . History of sleep apnea 05/31/2016  . HLA B27 (HLA B27 positive) 05/31/2016  . Trochanteric bursitis of both hips 05/31/2016  . OSA (obstructive sleep apnea) 01/08/2016  . Overweight 01/08/2016  . Hyperlipidemia 01/08/2016  . Rheumatoid arthritis ([name]) 01/08/2016  . Cervicalgia 11/13/2013  . Sprain of neck 11/13/2013  . HTN (hypertension) 07/28/2013  . Mild obesity 07/28/2013    Past Medical History:  Diagnosis Date  . Hypertension   . Rheumatoid arthritis ([name])   . Seasonal allergies   . Sleep apnea    uses a cpap    Family History  Problem Relation Age of Onset  . Ovarian cancer Mother   . Cancer Mother        colon   . Diabetes Father   .  Heart disease Father   . Healthy Sister   . Healthy Daughter   . Healthy Daughter    Past Surgical History:  Procedure Laterality Date  . CARDIAC CATHETERIZATION  05/09/2012   EF 55% Normal Coronary Arteries  . COLONOSCOPY N/A 04/04/2018   Procedure: COLONOSCOPY;  Surgeon: Danie Binder, MD;  [facility];  Service: Endoscopy;  Laterality: N/A;  1:00 - pt knows to arrive at 10:30  . DOPPLER ECHOCARDIOGRAPHY  09/18/2011    EF>55%  . EXCISION MORTON'S NEUROMA  03/06/2012   Procedure: EXCISION MORTON'S NEUROMA;  Surgeon: Wylene Simmer, MD;  Location: [facility];  Service: Orthopedics;  Laterality: Right;  EXCISION OF RIGHT THIRD WEB SPACE NEUROMA  HENDER RETRACTOR *[place] USED AS TOURNIQUET.  ON AT 5573.  OFF AT 0826.  Marland Kitchen GXT  09/18/2011  . LEFT HEART CATHETERIZATION WITH CORONARY ANGIOGRAM Bilateral 05/26/2012   Procedure: LEFT HEART CATHETERIZATION WITH CORONARY ANGIOGRAM;  Surgeon: Sanda Klein, MD;  Location: [facility];  Service: Cardiovascular;  Laterality: Bilateral;  . SYNOVECTOMY  03/06/2012   Procedure: SYNOVECTOMY;  Surgeon: Wylene Simmer, MD;  Location: [facility];  Service: Orthopedics;  Laterality: Right;  SYNOVECTOMY OF THIRD AND FOURTH METATARSAL PHALANGEAL JOINT  . WISDOM TOOTH EXTRACTION     Social History   Social History Narrative  . Not on file   Immunization History  Administered Date(s) Administered  . Influenza-Unspecified 02/26/2015     Objective: Vital Signs: BP 133/75 (BP Location: Right Arm, Patient Position: Sitting, Cuff Size: Normal)   Pulse 69   Resp 14   Ht 6' 2"  (1.88 m)   Wt 241 lb (109.3 kg)   BMI 30.94 kg/m    Physical Exam Vitals signs and nursing note reviewed.  Constitutional:      Appearance: He is well-developed.  HENT:     Head: Normocephalic and atraumatic.  Eyes:     Conjunctiva/sclera: Conjunctivae normal.     Pupils: Pupils are equal, round, and reactive to light.  Neck:     Musculoskeletal: Normal range of motion and neck supple.  Cardiovascular:     Rate and Rhythm: Normal rate and regular rhythm.     Heart sounds: Normal heart sounds.  Pulmonary:     Effort: Pulmonary effort is normal.     Breath sounds: Normal breath sounds.  Abdominal:     General: Bowel sounds are normal.     Palpations: Abdomen is soft.  Skin:    General: Skin is warm and dry.     Capillary Refill: Capillary refill takes less than 2  seconds.  Neurological:     Mental Status: He is alert and oriented to person, place, and time.  Psychiatric:        Behavior: Behavior normal.      Musculoskeletal Exam: C-spine, thoracic spine, lumbar spine good range of motion.  No midline spinal tenderness.  No SI joint tenderness.  Shoulder joints, elbow joints, wrist joints, MCPs, PIPs, DIPs good range of motion with no synovitis.  He has PIP and DIP synovial thickening consistent with osteoarthritis of both hands.  Hip joints, knee joints, ankle joints, MTPs, PIPs and DIPs good range of motion no synovitis.  No warmth or effusion of bilateral knee joints.  He has bilateral knee crepitus.  No tenderness or swelling of ankle joints.  CDAI Exam: CDAI Score: 0.6  Patient Global: 3 mm; Provider Global: 3 mm Swollen: 0 ; Tender: 0  Joint Exam  No joint exam has been documented for this visit   There is currently no information documented on the homunculus. Go to the Rheumatology activity and complete the homunculus joint exam.  Investigation: No additional findings.  Imaging: No results found.  Recent Labs: Lab Results  Component Value Date   WBC 5.9 12/11/2016   HGB 14.3 12/11/2016   PLT 236 12/11/2016   NA 139 12/11/2016   K 4.2 12/11/2016   CL 103 12/11/2016   CO2 24 12/11/2016   GLUCOSE 88 12/11/2016   BUN 13 12/11/2016   CREATININE 1.11 12/11/2016   BILITOT 0.6 12/11/2016   ALKPHOS 54 12/11/2016   AST 20 12/11/2016   ALT 22 12/11/2016   PROT 7.1 12/11/2016   ALBUMIN 4.7 12/11/2016   CALCIUM 9.3 12/11/2016   GFRAA >89 12/11/2016   QFTBGOLDPLUS NEGATIVE 04/03/2018    Speciality Comments: No specialty comments available.  Procedures:  No procedures performed Allergies: Patient has no known allergies.   Assessment / Plan:     Visit Diagnoses: Rheumatoid arthritis involving multiple sites with positive rheumatoid factor ([place]): He has no synovitis on exam.  He has had any recent rheumatoid arthritis flares.   He is clinically doing well on Xeljanz XR 11 mg daily, methotrexate 4 tablets by mouth once weekly, and folic acid 1 mg by mouth daily.  He has been having increased pain in bilateral hands especially in the DIP joints.  He has no tenderness or synovitis on exam today.  He has complete fist formation bilaterally.  Joint protection and muscle strengthening were discussed.  X-rays of both hands were updated today.  He was also given a list of natural anti-inflammatories and hand exercises to start performing.  He will continue on Xeljanz and methotrexate as prescribed.  He does not need any refills at this time.  He was advised to notify us if he develops increased joint pain or joint swelling.  He will follow-up in the office in 5 months.  High risk medication use -Xeljanz XR 11 mg daily, methotrexate 4 tablets every 7 days and folic acid 1 mg daily.  Last TB gold negative on 04/03/2018.  Due for TB gold today and will monitor yearly.  Last lipid panel within normal limits on 01/27/2019 and will monitor yearly.  Most recent CBC/CMP within normal limits on 01/27/2019 and will monitor every 3 months.   - Plan: QuantiFERON-TB Gold Plus  Pain in both hands - He has been experiencing increased pain in bilateral hands.  He has PIP and DIP synovial thickening consistent with osteoarthritis of both hands.  He has been having increased pain and DIP joints.  He has not noticed any joint swelling.  He has no synovitis or dactylitis on exam.  He has been using Voltaren gel topically as needed for pain relief.  X-rays of both hands were updated today.  He was given a list of natural anti-inflammatories and hand exercises.  Plan: XR Hand 2 View Left, XR Hand 2 View Right  HLA B27 (HLA B27 positive)  Plantar fasciitis, left - Resolved.  Trochanteric bursitis of both hips - Resolved.  Other medical conditions are listed as follows:   OSA (obstructive sleep apnea)  Mixed hyperlipidemia  History of hypertension    Orders: Orders Placed This Encounter  Procedures  . XR Hand 2 View Left  . XR Hand 2 View Right  . QuantiFERON-TB Gold Plus   No orders of the defined types were placed in this encounter.  Face-to-face time spent with patient was 30 minutes. Greater than 50% of time was spent in counseling and coordination of care.  Follow-Up Instructions: Return in about 5 months (around 09/06/2019) for Rheumatoid arthritis.   Ofilia Neas, PA-C   I examined and evaluated the patient with Hazel Sams PA.  Patient's rheumatoid arthritis is well controlled on current combination of medications.  He had some DIP thickening on examination.  X-rays obtained today showed DIP thickening.  The x-ray findings were reviewed with him.  Joint protection and muscle strengthening was discussed.  The plan of care was discussed as noted above.  Bo Merino, MD  Note - This record has been created using Editor, commissioning.  Chart creation errors have been sought, but may not always  have been located. Such creation errors do not reflect on  the standard of medical care.

## 2019-04-01 ENCOUNTER — Other Ambulatory Visit: Payer: Self-pay | Admitting: Rheumatology

## 2019-04-01 ENCOUNTER — Other Ambulatory Visit: Payer: Self-pay

## 2019-04-01 DIAGNOSIS — Z20822 Contact with and (suspected) exposure to covid-19: Secondary | ICD-10-CM

## 2019-04-01 NOTE — Telephone Encounter (Signed)
Last Visit:10/31/18 Next Visit:04/08/19 Labs: 01/27/19 glucose 101 all other labs WNL  Okay to refill per Dr. Estanislado Pandy

## 2019-04-02 LAB — NOVEL CORONAVIRUS, NAA: SARS-CoV-2, NAA: NOT DETECTED

## 2019-04-08 ENCOUNTER — Ambulatory Visit: Payer: Self-pay

## 2019-04-08 ENCOUNTER — Other Ambulatory Visit: Payer: Self-pay

## 2019-04-08 ENCOUNTER — Encounter: Payer: Self-pay | Admitting: Rheumatology

## 2019-04-08 ENCOUNTER — Ambulatory Visit: Payer: BC Managed Care – PPO | Admitting: Rheumatology

## 2019-04-08 VITALS — BP 133/75 | HR 69 | Resp 14 | Ht 74.0 in | Wt 241.0 lb

## 2019-04-08 DIAGNOSIS — Z79899 Other long term (current) drug therapy: Secondary | ICD-10-CM | POA: Diagnosis not present

## 2019-04-08 DIAGNOSIS — Z1589 Genetic susceptibility to other disease: Secondary | ICD-10-CM | POA: Diagnosis not present

## 2019-04-08 DIAGNOSIS — M79642 Pain in left hand: Secondary | ICD-10-CM

## 2019-04-08 DIAGNOSIS — Z111 Encounter for screening for respiratory tuberculosis: Secondary | ICD-10-CM | POA: Diagnosis not present

## 2019-04-08 DIAGNOSIS — M7061 Trochanteric bursitis, right hip: Secondary | ICD-10-CM

## 2019-04-08 DIAGNOSIS — M722 Plantar fascial fibromatosis: Secondary | ICD-10-CM | POA: Diagnosis not present

## 2019-04-08 DIAGNOSIS — M0579 Rheumatoid arthritis with rheumatoid factor of multiple sites without organ or systems involvement: Secondary | ICD-10-CM

## 2019-04-08 DIAGNOSIS — E782 Mixed hyperlipidemia: Secondary | ICD-10-CM

## 2019-04-08 DIAGNOSIS — Z8679 Personal history of other diseases of the circulatory system: Secondary | ICD-10-CM

## 2019-04-08 DIAGNOSIS — G4733 Obstructive sleep apnea (adult) (pediatric): Secondary | ICD-10-CM

## 2019-04-08 DIAGNOSIS — M79641 Pain in right hand: Secondary | ICD-10-CM | POA: Diagnosis not present

## 2019-04-08 DIAGNOSIS — M7062 Trochanteric bursitis, left hip: Secondary | ICD-10-CM

## 2019-04-08 NOTE — Patient Instructions (Addendum)
Standing Labs We placed an order today for your standing lab work.    Please come back and get your standing labs in December and every 3 months   CBC and CMP  We have open lab daily Monday through Thursday from 8:30-12:30 PM and 1:30-4:30 PM and Friday from 8:30-12:30 PM and 1:30-4:00 PM at the office of Dr. Bo Merino.   You may experience shorter wait times on Monday and Friday afternoons. The office is located at [address], [place], [place], [place] [address] No appointment is necessary.   Labs are drawn by [facility].  You may receive a bill from [facility] for your lab work.  If you wish to have your labs drawn at another location, please call the office 24 hours in advance to send orders.  If you have any questions regarding directions or hours of operation,  please call [phone].   Just as a reminder please drink plenty of water prior to coming for your lab work. Thanks!    Hand Exercises Hand exercises can be helpful for almost anyone. These exercises can strengthen the hands, improve flexibility and movement, and increase blood flow to the hands. These results can make work and daily tasks easier. Hand exercises can be especially helpful for people who have joint pain from arthritis or have nerve damage from overuse (carpal tunnel syndrome). These exercises can also help people who have injured a hand. Exercises Most of these hand exercises are gentle stretching and motion exercises. It is usually safe to do them often throughout the day. Warming up your hands before exercise may help to reduce stiffness. You can do this with gentle massage or by placing your hands in warm water for 10-15 minutes. It is normal to feel some stretching, pulling, tightness, or mild discomfort as you begin new exercises. This will gradually improve. Stop an exercise right away if you feel sudden, severe pain or your pain gets worse. Ask your health care provider which exercises are best for  you. Knuckle bend or "claw" fist 1. Stand or sit with your arm, hand, and all five fingers pointed straight up. Make sure to keep your wrist straight during the exercise. 2. Gently bend your fingers down toward your palm until the tips of your fingers are touching the top of your palm. Keep your big knuckle straight and just bend the small knuckles in your fingers. 3. Hold this position for __________ seconds. 4. Straighten (extend) your fingers back to the starting position. Repeat this exercise 5-10 times with each hand. Full finger fist 1. Stand or sit with your arm, hand, and all five fingers pointed straight up. Make sure to keep your wrist straight during the exercise. 2. Gently bend your fingers into your palm until the tips of your fingers are touching the middle of your palm. 3. Hold this position for __________ seconds. 4. Extend your fingers back to the starting position, stretching every joint fully. Repeat this exercise 5-10 times with each hand. Straight fist 1. Stand or sit with your arm, hand, and all five fingers pointed straight up. Make sure to keep your wrist straight during the exercise. 2. Gently bend your fingers at the big knuckle, where your fingers meet your hand, and the middle knuckle. Keep the knuckle at the tips of your fingers straight and try to touch the bottom of your palm. 3. Hold this position for __________ seconds. 4. Extend your fingers back to the starting position, stretching every joint fully. Repeat this  exercise 5-10 times with each hand. Tabletop 1. Stand or sit with your arm, hand, and all five fingers pointed straight up. Make sure to keep your wrist straight during the exercise. 2. Gently bend your fingers at the big knuckle, where your fingers meet your hand, as far down as you can while keeping the small knuckles in your fingers straight. Think of forming a tabletop with your fingers. 3. Hold this position for __________ seconds. 4. Extend your  fingers back to the starting position, stretching every joint fully. Repeat this exercise 5-10 times with each hand. Finger spread 1. Place your hand flat on a table with your palm facing down. Make sure your wrist stays straight as you do this exercise. 2. Spread your fingers and thumb apart from each other as far as you can until you feel a gentle stretch. Hold this position for __________ seconds. 3. Bring your fingers and thumb tight together again. Hold this position for __________ seconds. Repeat this exercise 5-10 times with each hand. Making circles 1. Stand or sit with your arm, hand, and all five fingers pointed straight up. Make sure to keep your wrist straight during the exercise. 2. Make a circle by touching the tip of your thumb to the tip of your index finger. 3. Hold for __________ seconds. Then open your hand wide. 4. Repeat this motion with your thumb and each finger on your hand. Repeat this exercise 5-10 times with each hand. Thumb motion 1. Sit with your forearm resting on a table and your wrist straight. Your thumb should be facing up toward the ceiling. Keep your fingers relaxed as you move your thumb. 2. Lift your thumb up as high as you can toward the ceiling. Hold for __________ seconds. 3. Bend your thumb across your palm as far as you can, reaching the tip of your thumb for the small finger (pinkie) side of your palm. Hold for __________ seconds. Repeat this exercise 5-10 times with each hand. Grip strengthening  1. Hold a stress ball or other soft ball in the middle of your hand. 2. Slowly increase the pressure, squeezing the ball as much as you can without causing pain. Think of bringing the tips of your fingers into the middle of your palm. All of your finger joints should bend when doing this exercise. 3. Hold your squeeze for __________ seconds, then relax. Repeat this exercise 5-10 times with each hand. Contact a health care provider if:  Your hand pain or  discomfort gets much worse when you do an exercise.  Your hand pain or discomfort does not improve within 2 hours after you exercise. If you have any of these problems, stop doing these exercises right away. Do not do them again unless your health care provider says that you can. Get help right away if:  You develop sudden, severe hand pain or swelling. If this happens, stop doing these exercises right away. Do not do them again unless your health care provider says that you can. This information is not intended to replace advice given to you by your health care provider. Make sure you discuss any questions you have with your health care provider. Document Released: 04/25/2015 Document Revised: 09/04/2018 Document Reviewed: 05/15/2018 Elsevier Patient Education  2020 [employer].

## 2019-04-10 LAB — QUANTIFERON-TB GOLD PLUS
Mitogen-NIL: >10 IU/mL
NIL: 0.02 IU/mL
QuantiFERON-TB Gold Plus: NEGATIVE
TB1-NIL: 0 IU/mL
TB2-NIL: 0 IU/mL

## 2019-04-12 ENCOUNTER — Other Ambulatory Visit: Payer: Self-pay | Admitting: Rheumatology

## 2019-04-12 ENCOUNTER — Other Ambulatory Visit: Payer: Self-pay | Admitting: Cardiovascular Disease

## 2019-04-13 NOTE — Progress Notes (Signed)
TB gold negative

## 2019-04-28 DIAGNOSIS — M05771 Rheumatoid arthritis with rheumatoid factor of right ankle and foot without organ or systems involvement: Secondary | ICD-10-CM | POA: Diagnosis not present

## 2019-04-28 DIAGNOSIS — Z79899 Other long term (current) drug therapy: Secondary | ICD-10-CM | POA: Diagnosis not present

## 2019-05-15 ENCOUNTER — Telehealth: Payer: Self-pay

## 2019-05-15 NOTE — Telephone Encounter (Addendum)
Labs received via fax.   04/28/2019  CBC and CMP   Elevated glucose, ALT 48, absolute lymphocytes 718.   Labs reviewed by Hazel Sams, PA-C. Advise patient to reduce MTX to 3 tabs/weekly.   Advised patient of lab results and recommendations, patient verbalized understanding.

## 2019-05-27 ENCOUNTER — Other Ambulatory Visit: Payer: Self-pay

## 2019-05-27 ENCOUNTER — Ambulatory Visit: Payer: BC Managed Care – PPO | Attending: Internal Medicine

## 2019-05-27 DIAGNOSIS — Z20828 Contact with and (suspected) exposure to other viral communicable diseases: Secondary | ICD-10-CM | POA: Diagnosis not present

## 2019-05-27 DIAGNOSIS — Z20822 Contact with and (suspected) exposure to covid-19: Secondary | ICD-10-CM

## 2019-05-29 LAB — NOVEL CORONAVIRUS, NAA: SARS-CoV-2, NAA: DETECTED — AB

## 2019-06-26 ENCOUNTER — Telehealth: Payer: Self-pay | Admitting: *Deleted

## 2019-06-26 ENCOUNTER — Telehealth: Payer: Self-pay | Admitting: Rheumatology

## 2019-06-26 NOTE — Telephone Encounter (Signed)
Attempted to contact the patient and left message to advise patient he may come by the office to pick up.

## 2019-06-26 NOTE — Telephone Encounter (Signed)
Received notification from [facility] regarding a prior authorization for XELJANZ XR. Authorization has been APPROVED from 06/26/19 to 06/25/20.   Will send document to scan center.  Authorization # [id number] Phone # [phone]  Called patient to advise.  3:35 PM Dorthula Nettles, CPhT

## 2019-06-26 NOTE — Telephone Encounter (Signed)
Medication Samples have been provided to the patient.  Drug name: Harriette Ohara     Strength: 11 mg    Qty: 1 LOT: [id number]  Exp.Date: 08/2020  Dosing instructions: Take 1 tablet by mouth daily.   The patient has been instructed regarding the correct time, dose, and frequency of taking this medication, including desired effects and most common side effects.   Dulcy Fanny 3:02 PM 06/26/2019

## 2019-06-26 NOTE — Telephone Encounter (Addendum)
#  1.Patient calling to let you know he has a new [employer] policy this year, and because of that insurance is requiring a prior authorization for Xeljanx XR. Patient usually gets rx thru [facility]. New insurance # in chart. #2.Patient is also completely out of medication, and was due for his dose today. Please call to advise.

## 2019-06-26 NOTE — Telephone Encounter (Signed)
Submitted a Prior Authorization request to [facility] for XELJANZ via Cover My Meds. Will update once we receive a response.    

## 2019-07-01 DIAGNOSIS — L570 Actinic keratosis: Secondary | ICD-10-CM | POA: Diagnosis not present

## 2019-07-01 DIAGNOSIS — B070 Plantar wart: Secondary | ICD-10-CM | POA: Diagnosis not present

## 2019-07-01 DIAGNOSIS — C44612 Basal cell carcinoma of skin of right upper limb, including shoulder: Secondary | ICD-10-CM | POA: Diagnosis not present

## 2019-07-01 DIAGNOSIS — D485 Neoplasm of uncertain behavior of skin: Secondary | ICD-10-CM | POA: Diagnosis not present

## 2019-07-09 DIAGNOSIS — C44612 Basal cell carcinoma of skin of right upper limb, including shoulder: Secondary | ICD-10-CM | POA: Diagnosis not present

## 2019-08-07 ENCOUNTER — Telehealth: Payer: Self-pay | Admitting: *Deleted

## 2019-08-07 NOTE — Telephone Encounter (Signed)
Results received from lab work drawn on 07/28/19 Reviewed by Sherron Ales, PA-C  Lipid Panel, CBC,CMP  Triglycerides 245 Glucose 101 WBC 3.3 Absolute Lymphocytes 700  Patient is on Xeljanz 11 mg daily and on MTX 4 tabs weekly.   Recommendation per Sherron Ales, PA-C Please advise patient to discontinue Methotrexate due to low WBC. His WBC has been trending down since September. He can continue on Xeljanz 11 mg XR daily. Repeat cbc/cmp in 3 months.  Patient advised.

## 2019-09-04 ENCOUNTER — Other Ambulatory Visit: Payer: Self-pay | Admitting: Rheumatology

## 2019-09-04 NOTE — Telephone Encounter (Signed)
Last Visit: 04/08/19 Next visit due April 2021. Message sent to the front to schedule Labs: 07/28/19  Glucose 101 WBC 3.3 Absolute Lymphocytes 700  Okay to refill per Dr. Corliss Skains

## 2019-09-04 NOTE — Telephone Encounter (Signed)
Please schedule patient for a follow up visit. Patient due April 2021. Thanks!  

## 2019-09-07 ENCOUNTER — Other Ambulatory Visit: Payer: Self-pay | Admitting: Cardiovascular Disease

## 2019-09-11 ENCOUNTER — Other Ambulatory Visit: Payer: Self-pay

## 2019-09-11 ENCOUNTER — Telehealth: Payer: Self-pay

## 2019-09-11 NOTE — Telephone Encounter (Signed)
Lab results received via fax.   09/08/2019 CBC, CMP  Glucose 113, ALT 48, WBC 3.7.  Labs reviewed by Sherron Ales, PA-C, WBC count improving, please recommend avoiding NSAIDs, tylenol and alcohol. Will send document to scan center.   Advised patient of recommendations, patient verbalized understanding.  Patient also asked if he is able to receive the COVID vaccine since his WBC count has improved. I discussed with Ladona Ridgel and patient should contact his PCP in regards to the vaccine even though the WBC has improved. Also advised patient he should hold xeljanz 1 week after each vaccine, patient verbalized understanding.

## 2019-09-13 ENCOUNTER — Other Ambulatory Visit: Payer: Self-pay | Admitting: Cardiovascular Disease

## 2019-09-14 ENCOUNTER — Other Ambulatory Visit: Payer: Self-pay | Admitting: Cardiovascular Disease

## 2019-09-14 NOTE — Telephone Encounter (Signed)
*  STAT* If patient is at the pharmacy, call can be transferred to refill team.   1. Which medications need to be refilled? (please list name of each medication and dose if known) amLODipine-olmesartan (AZOR) 5-20 MG tablet  2. Which pharmacy/location (including street and city if local pharmacy) is medication to be sent to? [facility] - [place], [place] - [address]  3. Do they need a 30 day or 90 day supply? 90 day supply   Patient states he is completely out of medication. An appointment has been scheduled for 11/27/19 at 8:00 AM with Dr. Royann Shivers.

## 2019-09-15 ENCOUNTER — Other Ambulatory Visit: Payer: Self-pay | Admitting: Cardiovascular Disease

## 2019-09-16 MED ORDER — AMLODIPINE-OLMESARTAN 5-20 MG PO TABS: 1.0000 | ORAL_TABLET | Freq: Every day | ORAL | 0 refills | Status: DC

## 2019-09-17 NOTE — Telephone Encounter (Signed)
Rx(s) sent to pharmacy electronically. MD appt on 11/27/2019

## 2019-10-07 NOTE — Progress Notes (Signed)
Office Visit Note  Patient: Bobby Kramer             Date of Birth: 1970/11/30           MRN: [id number]             PCP: Jake Samples, PA-C Referring: Jake Samples, [place]* Visit Date: 10/20/2019 Occupation: @GUAROCC @  Subjective:  Medication monitoring   History of Present Illness: Bobby Kramer is a 49 y.o. male with history of seropositive rheumatoid arthritis.  He is taking xeljanz 11 mg XR daily.  He discontinued MTX in April 2021 due to low WBC count and elevated LFTs.  He denies any increased joint pain or joint swelling since discontinuing MTX.  He has not had any recent flares.  He denies any nocturnal pain or difficulty with ADLs.  He has morning stiffness for 5 minutes daily.    Activities of Daily Living:  Patient reports morning stiffness for 5 minutes.   Patient Denies nocturnal pain.  Difficulty dressing/grooming: Denies Difficulty climbing stairs: Denies Difficulty getting out of chair: Denies Difficulty using hands for taps, buttons, cutlery, and/or writing: Denies  Review of Systems  Constitutional: Positive for fatigue. Negative for night sweats.  HENT: Negative for mouth sores, mouth dryness and nose dryness.   Eyes: Negative for redness, itching and dryness.  Respiratory: Negative for shortness of breath and difficulty breathing.   Cardiovascular: Negative for chest pain, palpitations, hypertension, irregular heartbeat and swelling in legs/feet.  Gastrointestinal: Negative for blood in stool, constipation and diarrhea.  Endocrine: Negative for increased urination.  Genitourinary: Negative for difficulty urinating and painful urination.  Musculoskeletal: Positive for morning stiffness. Negative for arthralgias, joint pain, joint swelling, myalgias, muscle weakness, muscle tenderness and myalgias.  Skin: Negative for color change, rash, hair loss, nodules/bumps, redness, skin tightness, ulcers and sensitivity to sunlight.  Allergic/Immunologic:  Negative for susceptible to infections.  Neurological: Negative for dizziness, fainting, numbness, headaches, memory loss, night sweats and weakness.  Hematological: Negative for bruising/bleeding tendency and swollen glands.  Psychiatric/Behavioral: Negative for depressed mood, confusion and sleep disturbance. The patient is not nervous/anxious.     PMFS History:  Patient Active Problem List   Diagnosis Date Noted   Special screening for malignant neoplasms, colon    Pain in right hip 06/20/2016   High risk medication use 05/31/2016   History of hypertension 05/31/2016   History of sleep apnea 05/31/2016   HLA B27 (HLA B27 positive) 05/31/2016   Trochanteric bursitis of both hips 05/31/2016   OSA (obstructive sleep apnea) 01/08/2016   Overweight 01/08/2016   Hyperlipidemia 01/08/2016   Rheumatoid arthritis ([place]) 01/08/2016   Cervicalgia 11/13/2013   Sprain of neck 11/13/2013   HTN (hypertension) 07/28/2013   Mild obesity 07/28/2013    Past Medical History:  Diagnosis Date   Hypertension    Rheumatoid arthritis ([place])    Seasonal allergies    Sleep apnea    uses a cpap    Family History  Problem Relation Age of Onset   Ovarian cancer Mother    Cancer Mother        colon    Diabetes Father    Heart disease Father    Healthy Sister    Healthy Daughter    Healthy Daughter    Past Surgical History:  Procedure Laterality Date   CARDIAC CATHETERIZATION  05/09/2012   EF 55% Normal Coronary Arteries   COLONOSCOPY N/A 04/04/2018   Procedure: COLONOSCOPY;  Surgeon: Danie Binder,  MD;  [facility];  Service: Endoscopy;  Laterality: N/A;  1:00 - pt knows to arrive at 10:30   DOPPLER ECHOCARDIOGRAPHY  09/18/2011   EF>55%   EXCISION MORTON'S NEUROMA  03/06/2012   Procedure: EXCISION MORTON'S NEUROMA;  Surgeon: Wylene Simmer, MD;  Location: [place];  Service: Orthopedics;  Laterality: Right;  EXCISION OF RIGHT THIRD WEB  SPACE NEUROMA  HENDER RETRACTOR *[place] USED AS TOURNIQUET.  ON AT 1937.  OFF AT 0826.   GXT  09/18/2011   LEFT HEART CATHETERIZATION WITH CORONARY ANGIOGRAM Bilateral 05/26/2012   Procedure: LEFT HEART CATHETERIZATION WITH CORONARY ANGIOGRAM;  Surgeon: Sanda Klein, MD;  Location: [facility];  Service: Cardiovascular;  Laterality: Bilateral;   SYNOVECTOMY  03/06/2012   Procedure: SYNOVECTOMY;  Surgeon: Wylene Simmer, MD;  Location: [place];  Service: Orthopedics;  Laterality: Right;  SYNOVECTOMY OF THIRD AND FOURTH METATARSAL PHALANGEAL JOINT   WISDOM TOOTH EXTRACTION     Social History   Social History Narrative   Not on file   Immunization History  Administered Date(s) Administered   Influenza-Unspecified 02/26/2015, 03/28/2018     Objective: Vital Signs: BP 126/73 (BP Location: Left Arm, Patient Position: Sitting, Cuff Size: Normal)    Pulse 63    Resp 16    Ht 6' 2"  (1.88 m)    Wt 239 lb (108.4 kg)    BMI 30.69 kg/m    Physical Exam Vitals and nursing note reviewed.  Constitutional:      Appearance: He is well-developed.  HENT:     Head: Normocephalic and atraumatic.  Eyes:     Conjunctiva/sclera: Conjunctivae normal.     Pupils: Pupils are equal, round, and reactive to light.  Pulmonary:     Effort: Pulmonary effort is normal.  Abdominal:     General: Bowel sounds are normal.     Palpations: Abdomen is soft.  Musculoskeletal:     Cervical back: Normal range of motion and neck supple.  Skin:    General: Skin is warm and dry.     Capillary Refill: Capillary refill takes less than 2 seconds.  Neurological:     Mental Status: He is alert and oriented to person, place, and time.  Psychiatric:        Behavior: Behavior normal.      Musculoskeletal Exam: C-spine, thoracic spine, and lumbar spine good ROM.  Shoulder joints, elbow joints, wrist joints, MCPs, PIPs, and DIPs good ROM with no synovitis.  Complete fist formation bilaterally.  Hip  joints, knee joints, ankle joints, MCPs, PIPs, and DIPs good ROM with no synovitis.  No warmth or effusion of knee joints.  No tenderness or swelling of ankle joints.   CDAI Exam: CDAI Score: 0.6  Patient Global: 3 mm; Provider Global: 3 mm Swollen: 0 ; Tender: 0  Joint Exam 10/20/2019   No joint exam has been documented for this visit   There is currently no information documented on the homunculus. Go to the Rheumatology activity and complete the homunculus joint exam.  Investigation: No additional findings.  Imaging: No results found.  Recent Labs: Lab Results  Component Value Date   WBC 5.9 12/11/2016   HGB 14.3 12/11/2016   PLT 236 12/11/2016   NA 139 12/11/2016   K 4.2 12/11/2016   CL 103 12/11/2016   CO2 24 12/11/2016   GLUCOSE 88 12/11/2016   BUN 13 12/11/2016   CREATININE 1.11 12/11/2016   BILITOT 0.6 12/11/2016   ALKPHOS  54 12/11/2016   AST 20 12/11/2016   ALT 22 12/11/2016   PROT 7.1 12/11/2016   ALBUMIN 4.7 12/11/2016   CALCIUM 9.3 12/11/2016   GFRAA >89 12/11/2016   QFTBGOLDPLUS NEGATIVE 04/08/2019    Speciality Comments: No specialty comments available.  Procedures:  No procedures performed Allergies: Patient has no known allergies.   Assessment / Plan:     Visit Diagnoses: Rheumatoid arthritis involving multiple sites with positive rheumatoid factor [facility]): He has no tenderness or synovitis on exam.  He has not had any recent rheumatoid arthritis flares.  He is clinically doing well on Xeljanz 11 mg XR daily as monotherapy.  He discontinued methotrexate in March 2021 due to low white blood cell count and elevated LFTs.  He has not had any increased pain or inflammation since discontinuing methotrexate.  He has no new concerns at this time.  He will continue on Xeljanz 11 mg daily as monotherapy.  He does not need any refills at this time.  He was advised to notify us if he develops increased joint pain or joint swelling.  He will follow-up in the office  in 5 months.  Association of heart disease with rheumatoid arthritis was discussed. Need to monitor blood pressure, cholesterol, and to exercise 30-60 minutes on daily basis was discussed. Poor dental hygiene can be a predisposing factor for rheumatoid arthritis. Good dental hygiene was discussed.  High risk medication use - Xeljanz XR 11 mg daily. CBC and CMP were drawn on 09/08/19.  WBC count was 3.7 (improving).  Discontinued MTX due to low WBC count and elevated LFTs in March 2021.  He was encouraged to receive the covid-19 vaccine. He was advised to hold Xeljanz for 1 week after each vaccine dose.    HLA B27 (HLA B27 positive)  Plantar fasciitis, left: Resolved.  No tenderness to palpation on exam.   Trochanteric bursitis of both hips: Resolved.  He has good ROM of both hip joints with no discomfort.  No tenderness to palpation over bilateral trochanteric bursitis.    Other medical conditions are listed as follows:   OSA (obstructive sleep apnea)  Mixed hyperlipidemia  History of hypertension  Orders: No orders of the defined types were placed in this encounter.  No orders of the defined types were placed in this encounter.    Follow-Up Instructions: Return in about 5 months (around 03/21/2020) for Rheumatoid arthritis.   Ofilia Neas, PA-C  Note - This record has been created using Dragon software.  Chart creation errors have been sought, but may not always  have been located. Such creation errors do not reflect on  the standard of medical care.

## 2019-10-20 ENCOUNTER — Ambulatory Visit: Payer: BC Managed Care – PPO | Admitting: Physician Assistant

## 2019-10-20 ENCOUNTER — Other Ambulatory Visit: Payer: Self-pay

## 2019-10-20 ENCOUNTER — Encounter: Payer: Self-pay | Admitting: Physician Assistant

## 2019-10-20 VITALS — BP 126/73 | HR 63 | Resp 16 | Ht 74.0 in | Wt 239.0 lb

## 2019-10-20 DIAGNOSIS — M7062 Trochanteric bursitis, left hip: Secondary | ICD-10-CM

## 2019-10-20 DIAGNOSIS — Z1589 Genetic susceptibility to other disease: Secondary | ICD-10-CM | POA: Diagnosis not present

## 2019-10-20 DIAGNOSIS — M7061 Trochanteric bursitis, right hip: Secondary | ICD-10-CM

## 2019-10-20 DIAGNOSIS — Z79899 Other long term (current) drug therapy: Secondary | ICD-10-CM

## 2019-10-20 DIAGNOSIS — M722 Plantar fascial fibromatosis: Secondary | ICD-10-CM

## 2019-10-20 DIAGNOSIS — G4733 Obstructive sleep apnea (adult) (pediatric): Secondary | ICD-10-CM

## 2019-10-20 DIAGNOSIS — E782 Mixed hyperlipidemia: Secondary | ICD-10-CM

## 2019-10-20 DIAGNOSIS — M0579 Rheumatoid arthritis with rheumatoid factor of multiple sites without organ or systems involvement: Secondary | ICD-10-CM | POA: Diagnosis not present

## 2019-10-20 DIAGNOSIS — Z8679 Personal history of other diseases of the circulatory system: Secondary | ICD-10-CM

## 2019-10-20 NOTE — Patient Instructions (Signed)
Standing Labs We placed an order today for your standing lab work.    Please come back and get your standing labs in July and every 3 months  We have open lab daily Monday through Thursday from 8:30-12:30 PM and 1:30-4:30 PM and Friday from 8:30-12:30 PM and 1:30-4:00 PM at the office of Dr. Shaili Deveshwar.   You may experience shorter wait times on Monday and Friday afternoons. The office is located at [address], [address], [place], [place] [address] No appointment is necessary.   Labs are drawn by [facility].  You may receive a bill from [facility] for your lab work.  If you wish to have your labs drawn at another location, please call the office 24 hours in advance to send orders.  If you have any questions regarding directions or hours of operation,  please call [phone].   Just as a reminder please drink plenty of water prior to coming for your lab work. Thanks!  

## 2019-11-18 ENCOUNTER — Telehealth: Payer: Self-pay | Admitting: *Deleted

## 2019-11-18 NOTE — Telephone Encounter (Signed)
Results received from patient's PCP Drawn on 11/03/2019 Reviewed by Dr. Corliss Skains   CBC/CMP  Absolute Lymphocytes 820 Rest of labs are normal  Patient is on Xeljanz 11 mg one tablet by mouth daily.  Will send document to scan center.

## 2019-11-22 ENCOUNTER — Other Ambulatory Visit: Payer: Self-pay | Admitting: Rheumatology

## 2019-11-23 NOTE — Telephone Encounter (Signed)
Last Visit: 10/20/2019 Next visit due October 2021. Message sent to the front to schedule.  Labs: 11/03/2019 Absolute Lymphocytes 820, Rest of labs are normal  Current Dose per office note 10/20/2019: Harriette Ohara XR 11 mg daily DX: Rheumatoid arthritis  Okay to refill Harriette Ohara?

## 2019-11-23 NOTE — Telephone Encounter (Signed)
Ok to refill xeljanz 11 mg daily.

## 2019-11-23 NOTE — Telephone Encounter (Signed)
Verified verbal prescription with Sherron Ales, PA-C to send in xeljanz 11 mg daily.  #90 no refills.

## 2019-11-27 ENCOUNTER — Ambulatory Visit: Payer: BC Managed Care – PPO | Admitting: Cardiovascular Disease

## 2019-12-08 ENCOUNTER — Other Ambulatory Visit: Payer: Self-pay | Admitting: Cardiovascular Disease

## 2019-12-18 DIAGNOSIS — F419 Anxiety disorder, unspecified: Secondary | ICD-10-CM | POA: Diagnosis not present

## 2019-12-25 DIAGNOSIS — F419 Anxiety disorder, unspecified: Secondary | ICD-10-CM | POA: Diagnosis not present

## 2020-01-01 DIAGNOSIS — F419 Anxiety disorder, unspecified: Secondary | ICD-10-CM | POA: Diagnosis not present

## 2020-01-08 ENCOUNTER — Other Ambulatory Visit: Payer: Self-pay

## 2020-01-08 ENCOUNTER — Ambulatory Visit: Payer: BC Managed Care – PPO | Admitting: Cardiovascular Disease

## 2020-01-08 ENCOUNTER — Encounter: Payer: Self-pay | Admitting: Cardiovascular Disease

## 2020-01-08 VITALS — BP 115/65 | HR 64 | Ht 74.0 in | Wt 225.6 lb

## 2020-01-08 DIAGNOSIS — F419 Anxiety disorder, unspecified: Secondary | ICD-10-CM | POA: Diagnosis not present

## 2020-01-08 DIAGNOSIS — E782 Mixed hyperlipidemia: Secondary | ICD-10-CM | POA: Diagnosis not present

## 2020-01-08 DIAGNOSIS — I10 Essential (primary) hypertension: Secondary | ICD-10-CM

## 2020-01-08 DIAGNOSIS — M0579 Rheumatoid arthritis with rheumatoid factor of multiple sites without organ or systems involvement: Secondary | ICD-10-CM

## 2020-01-08 DIAGNOSIS — E663 Overweight: Secondary | ICD-10-CM | POA: Diagnosis not present

## 2020-01-08 DIAGNOSIS — G4733 Obstructive sleep apnea (adult) (pediatric): Secondary | ICD-10-CM

## 2020-01-08 NOTE — Progress Notes (Signed)
Cardiology Office Note    Date:  01/08/2020   ID:  Bobby Kramer, DOB 02-Apr-1971, MRN [id number]  PCP:  Avis Epley, PA-C  Cardiologist:   Thurmon Fair, MD   chief complaint: HTN   History of Present Illness:  Bobby Kramer is a 49 y.o. male with systemic hypertension with mild hypertensive heart disease (LVH echo 2013), but without coronary disease or clinically evident heart failure. He returns for routine follow-up. Additional problems include obstructive sleep apnea on CPAP and rheumatoid arthritis, on Xeljanz.  He has chronic fever of abnormalities on his electrocardiogram.  He is generally doing well.  He continues to go to the gym and lift weights 2 or 3 times a week and does not feel that he has had to cut back on his activity level.  He denies angina or dyspnea at rest or with exertion.  He has not been troubled by palpitations and has never had syncope.  He does not have leg edema or claudication.  He reports compliance with his CPAP and denies daytime hypersomnolence.  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  He might get a little dizzy unless he stays well-hydrated.  His ECG continues to show subtle T wave inversion especially in the inferior leads but also V3-V6.  It really has not changed in the last 7 or 8 years.  He gets his labs checked at work.  She was started on rosuvastatin 10 mg daily but the dose was subsequently decreased to 5 mg daily since his LDL response was so drastic and also because he has a tendency to have mildly abnormal liver function tests.  Repeat labs are scheduled next month.  I do not yet have a copy of those most recent labs. Labs from 2018 show total cholesterol 236, HDL 63, LDL 146, triglycerides 148, glucose 94, creatinine 0.9, hemoglobin 14.2, normal liver function tests.   Past Medical History:  Diagnosis Date  . Hypertension   . Rheumatoid arthritis (HCC)   . Seasonal allergies   . Sleep apnea    uses a  cpap    Past Surgical History:  Procedure Laterality Date  . CARDIAC CATHETERIZATION  05/09/2012   EF 55% Normal Coronary Arteries  . COLONOSCOPY N/A 04/04/2018   Procedure: COLONOSCOPY;  Surgeon: [place] Bali, MD;  [facility];  Service: Endoscopy;  Laterality: N/A;  1:00 - pt knows to arrive at 10:30  . DOPPLER ECHOCARDIOGRAPHY  09/18/2011   EF>55%  . EXCISION MORTON'S NEUROMA  03/06/2012   Procedure: EXCISION MORTON'S NEUROMA;  Surgeon: Toni Arthurs, MD;  Location: [facility];  Service: Orthopedics;  Laterality: Right;  EXCISION OF RIGHT THIRD WEB SPACE NEUROMA  HENDER RETRACTOR *[place] USED AS TOURNIQUET.  ON AT 6010.  OFF AT 0826.  Marland Kitchen GXT  09/18/2011  . LEFT HEART CATHETERIZATION WITH CORONARY ANGIOGRAM Bilateral 05/26/2012   Procedure: LEFT HEART CATHETERIZATION WITH CORONARY ANGIOGRAM;  Surgeon: Thurmon Fair, MD;  [facility];  Service: Cardiovascular;  Laterality: Bilateral;  . SYNOVECTOMY  03/06/2012   Procedure: SYNOVECTOMY;  Surgeon: Toni Arthurs, MD;  Location: [facility];  Service: Orthopedics;  Laterality: Right;  SYNOVECTOMY OF THIRD AND FOURTH METATARSAL PHALANGEAL JOINT  . WISDOM TOOTH EXTRACTION      Current Medications: Outpatient Medications Prior to Visit  Medication Sig Dispense Refill  . amLODipine-olmesartan (AZOR) 5-20 MG tablet TAKE 1 TABLET BY MOUTH  DAILY 90 tablet 0  .  hydrochlorothiazide (MICROZIDE) 12.5 MG capsule TAKE 1 CAPSULE BY MOUTH  EVERY OTHER DAY. 45 capsule 2  . Multiple Vitamin (MULTIVITAMIN) tablet Take 1 tablet by mouth daily.    . rosuvastatin (CRESTOR) 5 MG tablet Take 5 mg by mouth daily.    Harriette Ohara XR 11 MG TB24 TAKE 1 TABLET BY MOUTH  DAILY 90 tablet 0  . folic acid (FOLVITE) 1 MG tablet Take 1 tablet (1 mg total) by mouth daily. 90 tablet 3   No facility-administered medications prior to visit.     Allergies:   Patient has no known allergies.   Social History   Socioeconomic  History  . Marital status: Married    Spouse name: Not on file  . Number of children: Not on file  . Years of education: Not on file  . Highest education level: Not on file  Occupational History  . Not on file  Tobacco Use  . Smoking status: Former Smoker    Types: Cigarettes    Quit date: 03/05/2007    Years since quitting: 12.8  . Smokeless tobacco: Never Used  . Tobacco comment: recreational   Vaping Use  . Vaping Use: Never used  Substance and Sexual Activity  . Alcohol use: Yes    Comment: rarely  . Drug use: Never  . Sexual activity: Not on file  Other Topics Concern  . Not on file  Social History Narrative  . Not on file   Social Determinants of Health   Financial Resource Strain:   . Difficulty of Paying Living Expenses:   Food Insecurity:   . Worried About Programme researcher, broadcasting/film/video in the Last Year:   . Barista in the Last Year:   Transportation Needs:   . Freight forwarder (Medical):   Marland Kitchen Lack of Transportation (Non-Medical):   Physical Activity:   . Days of Exercise per Week:   . Minutes of Exercise per Session:   Stress:   . Feeling of Stress :   Social Connections:   . Frequency of Communication with Friends and Family:   . Frequency of Social Gatherings with Friends and Family:   . Attends Religious Services:   . Active Member of Clubs or Organizations:   . Attends Banker Meetings:   Marland Kitchen Marital Status:      Family History:  The patient's Family history significant for longevity and the absence of premature coronary/other cardiac problems   ROS:   Please see the history of present illness.    ROS All other systems are reviewed and are negative.   PHYSICAL EXAM:   VS:  BP 115/65   Pulse 64   Ht 6\' 2"  (1.88 m)   Wt 225 lb 9.6 oz (102.3 kg)   SpO2 99%   BMI 28.97 kg/m      General: Alert, oriented x3, no distress, overweight but appears fit. Head: no evidence of trauma, PERRL, EOMI, no exophtalmos or lid lag, no myxedema,  no xanthelasma; normal ears, nose and oropharynx Neck: normal jugular venous pulsations and no hepatojugular reflux; brisk carotid pulses without delay and no carotid bruits Chest: clear to auscultation, no signs of consolidation by percussion or palpation, normal fremitus, symmetrical and full respiratory excursions Cardiovascular: normal position and quality of the apical impulse, regular rhythm, normal first and second heart sounds, no murmurs, rubs or gallops Abdomen: no tenderness or distention, no masses by palpation, no abnormal pulsatility or arterial bruits, normal bowel sounds, no hepatosplenomegaly  Extremities: no clubbing, cyanosis or edema; 2+ radial, ulnar and brachial pulses bilaterally; 2+ right femoral, posterior tibial and dorsalis pedis pulses; 2+ left femoral, posterior tibial and dorsalis pedis pulses; no subclavian or femoral bruits Neurological: grossly nonfocal Psych: Normal mood and affect    Wt Readings from Last 3 Encounters:  01/08/20 225 lb 9.6 oz (102.3 kg)  10/20/19 239 lb (108.4 kg)  04/08/19 241 lb (109.3 kg)      Studies/Labs Reviewed:   EKG:  EKG is ordered today.  The ekg ordered today normal sinus rhythm, minor nonspecific T wave inversion in the anterior and lateral precordial leads, unchanged from 2016 and 2017 tracings  Recent Labs: Labs from 2018 show glucose 94, creatinine 0.9, hemoglobin 14.2, normal liver function tests.  Lipid Panel 2018 total cholesterol 236, HDL 63, LDL 146, triglycerides 148 2017 total cholesterol  205, triglycerides 140, HDL 50, LDL 127.  08/03/2014 TC 215, TG 148, LDL 142, HDL 43, non HDL 172 02/23/2014 TC 209, TG 223, LDL 122, HDL42, non HDL 167  ASSESSMENT:    1. Essential hypertension   2. Overweight   3. Mixed hyperlipidemia   4. OSA (obstructive sleep apnea)   5. Rheumatoid arthritis involving multiple sites with positive rheumatoid factor (HCC)      PLAN:  In order of problems listed  above:  1. HTN: Well-controlled.  Chronic ECG changes may be related to LVH which was documented on a previous echocardiogram.  ECG changes have been stable over time. 2. Overweight: Congratulated on staying physically active.  Would still try to get his weight down to 200 pounds in waistline to 34 inches. 3. HLP: Baseline lipid profile suggests insulin resistance/metabolic syndrome family would like to see his most recent values. 4. OSA: Compliant with CPAP, no daytime hypersomnolence 5. RA: Well-controlled on Xeljanz seeing Dr. Corliss Skains  Medication Adjustments/Labs and Tests Ordered: Current medicines are reviewed at length with the patient today.  Concerns regarding medicines are outlined above.  Medication changes, Labs and Tests ordered today are listed in the Patient Instructions below. Patient Instructions  Medication Instructions:  No changes *If you need a refill on your cardiac medications before your next appointment, please call your pharmacy*   Lab Work: None ordered If you have labs (blood work) drawn today and your tests are completely normal, you will receive your results only by: Marland Kitchen MyChart Message (if you have MyChart) OR . A paper copy in the mail If you have any lab test that is abnormal or we need to change your treatment, we will call you to review the results.   Testing/Procedures: None ordered   Follow-Up: At [facility], you and your health needs are our priority.  As part of our continuing mission to provide you with exceptional heart care, we have created designated Provider Care Teams.  These Care Teams include your primary Cardiologist (physician) and Advanced Practice Providers (APPs -  Physician Assistants and Nurse Practitioners) who all work together to provide you with the care you need, when you need it.  We recommend signing up for the patient portal called "MyChart".  Sign up information is provided on this After Visit Summary.  MyChart is used  to connect with patients for Virtual Visits (Telemedicine).  Patients are able to view lab/test results, encounter notes, upcoming appointments, etc.  Non-urgent messages can be sent to your provider as well.   To learn more about what you can do with MyChart, go to [email or web address].    Your  next appointment:   12 month(s)  The format for your next appointment:   In Person  Provider:   You may see Thurmon Fair, MD or one of the following Advanced Practice Providers on your designated Care Team:    Azalee Course, PA-C  Micah Flesher, [place] or   Judy Pimple, PA-C       Signed, Thurmon Fair, MD  01/08/2020 9:42 AM    [facility] [address] [place], [place], [place]  [address] Phone: [phone]; Fax: [phone]

## 2020-01-08 NOTE — Patient Instructions (Signed)

## 2020-01-22 DIAGNOSIS — F419 Anxiety disorder, unspecified: Secondary | ICD-10-CM | POA: Diagnosis not present

## 2020-02-03 ENCOUNTER — Telehealth: Payer: Self-pay | Admitting: *Deleted

## 2020-02-03 NOTE — Telephone Encounter (Signed)
Labs received from PCP Reviewed by Dr. Corliss Skains  Labs drawn on 01/12/2020  Glucose 115 Globulin 1.8  Patient on Xeljanz XR 11 mg po daily.

## 2020-02-08 ENCOUNTER — Other Ambulatory Visit: Payer: Self-pay | Admitting: Cardiovascular Disease

## 2020-02-14 ENCOUNTER — Other Ambulatory Visit: Payer: Self-pay | Admitting: Physician Assistant

## 2020-02-15 NOTE — Telephone Encounter (Signed)
Please schedule patient for a follow up visit. Patient due October 2021. Thanks!  °

## 2020-02-15 NOTE — Telephone Encounter (Signed)
Last Visit: 10/20/2019 Next visit due October 2021. Message sent to the front to schedule.  Labs: 01/12/2020 Glucose 115 Globulin 1.8  Current Dose per office note 10/20/2019: Harriette Ohara XR 11 mg daily DX: Rheumatoid arthritis  Okay to refill Harriette Ohara?

## 2020-02-15 NOTE — Telephone Encounter (Signed)
[facility] for patient to call and schedule follow-up appointment in October.

## 2020-03-08 DIAGNOSIS — Z23 Encounter for immunization: Secondary | ICD-10-CM | POA: Diagnosis not present

## 2020-04-23 ENCOUNTER — Other Ambulatory Visit: Payer: Self-pay | Admitting: Physician Assistant

## 2020-04-27 ENCOUNTER — Other Ambulatory Visit: Payer: Self-pay | Admitting: Physician Assistant

## 2020-05-06 ENCOUNTER — Other Ambulatory Visit: Payer: Self-pay | Admitting: Physician Assistant

## 2020-05-06 ENCOUNTER — Telehealth: Payer: Self-pay | Admitting: *Deleted

## 2020-05-06 NOTE — Telephone Encounter (Signed)
Last Visit: 10/20/2019 Next visit due October 2021. Message sent to the front to schedule.  Labs: 05/03/2020 WBC 3.3, Glucose 107, Absolute Lymphocytes 812  Current Dose per office note 10/20/2019:Xeljanz XR 11 mg daily OZ:[id number] arthritis  Okay to refill Harriette Ohara?

## 2020-05-06 NOTE — Telephone Encounter (Signed)
Please schedule patient for a follow up visit. Patient was due October 2021. Thanks!  °

## 2020-05-06 NOTE — Telephone Encounter (Signed)
LMOM for patient to call and schedule follow-up appointment.   °

## 2020-05-06 NOTE — Telephone Encounter (Signed)
Lab results received from Dr. Chestine Spore Drawn on 05/03/2020 Reviewed by Sherron Ales, PA-C  WBC 3.3 Glucose 107 Absolute Lymphocytes 812  Previous WBC 07/28/2019 3.3, 02/12/2020 4.3 Patient on Xeljanz 11 mg po daily.

## 2020-05-09 NOTE — Progress Notes (Signed)
Office Visit Note  Patient: Bobby Kramer             Date of Birth: 28-Aug-1970           MRN: [id number]             PCP: Jake Samples, PA-C Referring: Jake Samples, [place]* Visit Date: 05/11/2020 Occupation: @GUAROCC @  Subjective:  Medication Management   History of Present Illness: Bobby Kramer is a 49 y.o. male with history of rheumatoid arthritis.  He states she has been doing very well on Xeljanz with out any joint pain or joint swelling.  He denies any morning stiffness.  He has been tolerating medication well.  He states he just had his COVID-19 booster and will stop the [place] for 1 week after the booster.  He did not have any flare.  Activities of Daily Living:  Patient reports morning stiffness for 0 minutes.   Patient Denies nocturnal pain.  Difficulty dressing/grooming: Denies Difficulty climbing stairs: Denies Difficulty getting out of chair: Denies Difficulty using hands for taps, buttons, cutlery, and/or writing: Denies  Review of Systems  Constitutional: Negative for fatigue.  HENT: Negative for mouth sores, mouth dryness and nose dryness.   Eyes: Negative for pain, itching and dryness.  Respiratory: Negative for shortness of breath and difficulty breathing.   Cardiovascular: Negative for chest pain and palpitations.  Gastrointestinal: Negative for blood in stool, constipation and diarrhea.  Endocrine: Negative for increased urination.  Genitourinary: Negative for difficulty urinating.  Musculoskeletal: Negative for arthralgias, joint pain, joint swelling, myalgias, morning stiffness, muscle tenderness and myalgias.  Skin: Negative for color change, rash and redness.  Allergic/Immunologic: Negative for susceptible to infections.  Neurological: Negative for dizziness, numbness, headaches, memory loss and weakness.  Hematological: Negative for bruising/bleeding tendency.  Psychiatric/Behavioral: Negative for confusion.    PMFS History:  Patient  Active Problem List   Diagnosis Date Noted  . Special screening for malignant neoplasms, colon   . Pain in right hip 06/20/2016  . High risk medication use 05/31/2016  . History of hypertension 05/31/2016  . History of sleep apnea 05/31/2016  . HLA B27 (HLA B27 positive) 05/31/2016  . Trochanteric bursitis of both hips 05/31/2016  . OSA (obstructive sleep apnea) 01/08/2016  . Overweight 01/08/2016  . Hyperlipidemia 01/08/2016  . Rheumatoid arthritis ([place]) 01/08/2016  . Cervicalgia 11/13/2013  . Sprain of neck 11/13/2013  . HTN (hypertension) 07/28/2013  . Mild obesity 07/28/2013    Past Medical History:  Diagnosis Date  . Hypertension   . Rheumatoid arthritis ([place])   . Seasonal allergies   . Sleep apnea    uses a cpap    Family History  Problem Relation Age of Onset  . Ovarian cancer Mother   . Cancer Mother        colon   . Diabetes Father   . Heart disease Father   . Healthy Sister   . Healthy Daughter   . Healthy Daughter    Past Surgical History:  Procedure Laterality Date  . CARDIAC CATHETERIZATION  05/09/2012   EF 55% Normal Coronary Arteries  . COLONOSCOPY N/A 04/04/2018   Procedure: COLONOSCOPY;  Surgeon: Danie Binder, MD;  [facility];  Service: Endoscopy;  Laterality: N/A;  1:00 - pt knows to arrive at 10:30  . DOPPLER ECHOCARDIOGRAPHY  09/18/2011   EF>55%  . EXCISION MORTON'S NEUROMA  03/06/2012   Procedure: EXCISION MORTON'S NEUROMA;  Surgeon: Wylene Simmer, MD;  Location: [facility];  Service: Orthopedics;  Laterality: Right;  EXCISION OF RIGHT THIRD WEB SPACE NEUROMA  HENDER RETRACTOR *[place] USED AS TOURNIQUET.  ON AT 2025.  OFF AT 0826.  Marland Kitchen GXT  09/18/2011  . LEFT HEART CATHETERIZATION WITH CORONARY ANGIOGRAM Bilateral 05/26/2012   Procedure: LEFT HEART CATHETERIZATION WITH CORONARY ANGIOGRAM;  Surgeon: Sanda Klein, MD;  Location: [facility];  Service: Cardiovascular;  Laterality: Bilateral;  . SYNOVECTOMY  03/06/2012    Procedure: SYNOVECTOMY;  Surgeon: Wylene Simmer, MD;  Location: [facility];  Service: Orthopedics;  Laterality: Right;  SYNOVECTOMY OF THIRD AND FOURTH METATARSAL PHALANGEAL JOINT  . WISDOM TOOTH EXTRACTION     Social History   Social History Narrative  . Not on file   Immunization History  Administered Date(s) Administered  . Influenza-Unspecified 02/26/2015, 03/28/2018  . Janssen (J&J) SARS-COV-2 Vaccination 11/06/2019  . Moderna Sars-Covid-2 Vaccination 04/27/2020     Objective: Vital Signs: BP 125/70 (BP Location: Left Arm, Patient Position: Sitting, Cuff Size: Normal)   Pulse 73   Resp 15   Ht 6' 2"  (1.88 m)   Wt 220 lb 6.4 oz (100 kg)   BMI 28.30 kg/m    Physical Exam Vitals and nursing note reviewed.  Constitutional:      Appearance: He is well-developed and well-nourished.  HENT:     Head: Normocephalic and atraumatic.  Eyes:     Extraocular Movements: EOM normal.     Conjunctiva/sclera: Conjunctivae normal.     Pupils: Pupils are equal, round, and reactive to light.  Cardiovascular:     Rate and Rhythm: Normal rate and regular rhythm.     Heart sounds: Normal heart sounds.  Pulmonary:     Effort: Pulmonary effort is normal.     Breath sounds: Normal breath sounds.  Abdominal:     General: Bowel sounds are normal.     Palpations: Abdomen is soft.  Musculoskeletal:     Cervical back: Normal range of motion and neck supple.  Skin:    General: Skin is warm and dry.     Capillary Refill: Capillary refill takes less than 2 seconds.  Neurological:     Mental Status: He is alert and oriented to person, place, and time.  Psychiatric:        Mood and Affect: Mood and affect normal.        Behavior: Behavior normal.      Musculoskeletal Exam: C-spine thoracic and lumbar spine with good range of motion.  Shoulder joints, elbow joints, wrist joints, MCPs PIPs and DIPs with good range of motion with no synovitis.  Hip joints, knee joints, ankles with  good range of motion.  He had no tenderness across MTPs.  CDAI Exam: CDAI Score: 0.4  Patient Global: 2 mm; Provider Global: 2 mm Swollen: 0 ; Tender: 0  Joint Exam 05/11/2020   No joint exam has been documented for this visit   There is currently no information documented on the homunculus. Go to the Rheumatology activity and complete the homunculus joint exam.  Investigation: No additional findings.  Imaging: No results found.  Recent Labs: Lab Results  Component Value Date   WBC 5.9 12/11/2016   HGB 14.3 12/11/2016   PLT 236 12/11/2016   NA 139 12/11/2016   K 4.2 12/11/2016   CL 103 12/11/2016   CO2 24 12/11/2016   GLUCOSE 88 12/11/2016   BUN 13 12/11/2016   CREATININE 1.11 12/11/2016   BILITOT 0.6 12/11/2016   ALKPHOS  54 12/11/2016   AST 20 12/11/2016   ALT 22 12/11/2016   PROT 7.1 12/11/2016   ALBUMIN 4.7 12/11/2016   CALCIUM 9.3 12/11/2016   GFRAA >89 12/11/2016   QFTBGOLDPLUS NEGATIVE 04/08/2019   01/12/20 CBC and CMP normal.   Lab results received from Dr. Carlis Abbott Drawn on 05/03/2020 Reviewed by Hazel Sams, PA-C  WBC 3.3 Glucose 107 Absolute Lymphocytes 812  Previous WBC 07/28/2019 3.3, 02/12/2020 4.3   Speciality Comments: Last Labs: 01/12/2020  Procedures:  No procedures performed Allergies: Patient has no known allergies.   Assessment / Plan:     Visit Diagnoses: Rheumatoid arthritis involving multiple sites with positive rheumatoid factor (HCC)-he is clinically doing very well with no synovitis on examination.  He denies any joint pain or joint stiffness.  High risk medication use - Xeljanz XR 11 mg daily.  -His labs from December 2021 showed mild neutropenia otherwise unremarkable.  Lab results were discussed.  Plan: QuantiFERON-TB Gold Plus today.  I detailed discussion with him about the black box warning on the medication with increased risk of DVT and risk of heart attack and stroke.  He has had cardiac cath in the past which was negative.   He has hyperlipidemia.  He is very active.  I also discussed possible use of enteric-coated aspirin 81 mg p.o. daily especially if he travels.  Increased fluid intake was also discussed.  We can consider decreasing Xeljanz to 5 mg p.o. daily in the future if he does well.  HLA B27 (HLA B27 positive)  Plantar fasciitis, left - Resolved.   Trochanteric bursitis of both hips - Resolved.   OSA (obstructive sleep apnea)-  Mixed hyperlipidemia-he is on Crestor.  I advised him to get a lipid panel once a year.  History of hypertension-his blood pressure is well controlled.  Educated but COVID-19 virus infection-he is fully immunized against COVID-19.  He also received a booster.  Use of mask, social distancing and hand hygiene was discussed.  Use of monoclonal antibodies were discussed in case he develops COVID-19 infection.  Orders: Orders Placed This Encounter  Procedures  . QuantiFERON-TB Gold Plus   No orders of the defined types were placed in this encounter.    Follow-Up Instructions: Return in about 5 months (around 10/09/2020) for Rheumatoid arthritis.   Bo Merino, MD  Note - This record has been created using Editor, commissioning.  Chart creation errors have been sought, but may not always  have been located. Such creation errors do not reflect on  the standard of medical care.

## 2020-05-11 ENCOUNTER — Ambulatory Visit: Payer: BC Managed Care – PPO | Admitting: Rheumatology

## 2020-05-11 ENCOUNTER — Other Ambulatory Visit: Payer: Self-pay

## 2020-05-11 ENCOUNTER — Other Ambulatory Visit: Payer: Self-pay | Admitting: Cardiovascular Disease

## 2020-05-11 ENCOUNTER — Encounter: Payer: Self-pay | Admitting: Rheumatology

## 2020-05-11 VITALS — BP 125/70 | HR 73 | Resp 15 | Ht 74.0 in | Wt 220.4 lb

## 2020-05-11 DIAGNOSIS — Z8679 Personal history of other diseases of the circulatory system: Secondary | ICD-10-CM

## 2020-05-11 DIAGNOSIS — M722 Plantar fascial fibromatosis: Secondary | ICD-10-CM | POA: Diagnosis not present

## 2020-05-11 DIAGNOSIS — M7061 Trochanteric bursitis, right hip: Secondary | ICD-10-CM

## 2020-05-11 DIAGNOSIS — Z7189 Other specified counseling: Secondary | ICD-10-CM

## 2020-05-11 DIAGNOSIS — M0579 Rheumatoid arthritis with rheumatoid factor of multiple sites without organ or systems involvement: Secondary | ICD-10-CM | POA: Diagnosis not present

## 2020-05-11 DIAGNOSIS — G4733 Obstructive sleep apnea (adult) (pediatric): Secondary | ICD-10-CM

## 2020-05-11 DIAGNOSIS — Z1589 Genetic susceptibility to other disease: Secondary | ICD-10-CM

## 2020-05-11 DIAGNOSIS — Z79899 Other long term (current) drug therapy: Secondary | ICD-10-CM

## 2020-05-11 DIAGNOSIS — M7062 Trochanteric bursitis, left hip: Secondary | ICD-10-CM

## 2020-05-11 DIAGNOSIS — E782 Mixed hyperlipidemia: Secondary | ICD-10-CM

## 2020-05-11 NOTE — Patient Instructions (Signed)
Standing Labs We placed an order today for your standing lab work.   Please have your standing labs drawn in March and every 3 months Fasting lipid panel should be obtained once a year TB Gold will be every December  If possible, please have your labs drawn 2 weeks prior to your appointment so that the provider can discuss your results at your appointment.  We have open lab daily Monday through Thursday from 8:30-12:30 PM and 1:30-4:30 PM and Friday from 8:30-12:30 PM and 1:30-4:00 PM at the office of Dr. Pollyann Savoy, [facility].   Please be advised, patients with office appointments requiring lab work will take precedents over walk-in lab work.  If possible, please come for your lab work on Monday and Friday afternoons, as you may experience shorter wait times. The office is located at [address], [place], [place] [address] No appointment is necessary.   Labs are drawn by [facility]. Please bring your co-pay at the time of your lab draw.  You may receive a bill from [facility] for your lab work.  If you wish to have your labs drawn at another location, please call the office 24 hours in advance to send orders.  If you have any questions regarding directions or hours of operation,  please call [phone].   As a reminder, please drink plenty of water prior to coming for your lab work. Thanks!

## 2020-05-13 LAB — QUANTIFERON-TB GOLD PLUS
Mitogen-NIL: >10 IU/mL
NIL: 0.04 IU/mL
QuantiFERON-TB Gold Plus: NEGATIVE
TB1-NIL: 0 IU/mL
TB2-NIL: 0 IU/mL

## 2020-05-15 NOTE — Progress Notes (Signed)
TB Gold is negative.

## 2020-05-31 DIAGNOSIS — F1010 Alcohol abuse, uncomplicated: Secondary | ICD-10-CM | POA: Diagnosis not present

## 2020-06-29 DIAGNOSIS — B070 Plantar wart: Secondary | ICD-10-CM | POA: Diagnosis not present

## 2020-06-29 DIAGNOSIS — L570 Actinic keratosis: Secondary | ICD-10-CM | POA: Diagnosis not present

## 2020-07-13 ENCOUNTER — Other Ambulatory Visit: Payer: Self-pay | Admitting: Physician Assistant

## 2020-07-14 DIAGNOSIS — D485 Neoplasm of uncertain behavior of skin: Secondary | ICD-10-CM | POA: Diagnosis not present

## 2020-07-14 DIAGNOSIS — B078 Other viral warts: Secondary | ICD-10-CM | POA: Diagnosis not present

## 2020-09-23 NOTE — Progress Notes (Signed)
Office Visit Note  Patient: Bobby Kramer             Date of Birth: 13-Mar-1971           MRN: [id number]             PCP: Jake Samples, PA-C Referring: Jake Samples, [place]* Visit Date: 10/07/2020 Occupation: @GUAROCC @  Subjective:  Medication monitoring  History of Present Illness: Bobby Kramer is a 50 y.o. male with history of seropositive rheumatoid arthritis.  Patient is currently taking Xeljanz 11 mg XR by mouth daily.  He continues to tolerate Morrie Sheldon without any side effects.  He has not missed any doses of Xeljanz recently.  He states that since his last office visit he started taking aspirin 81 mg 1 tablet by mouth daily as recommended by Dr. Estanislado Pandy.  He denies any signs or symptoms of a flare recently.  He states that he does not feel as though he has rheumatoid arthritis due to the effectiveness of Xeljanz.  He denies any joint pain, joint swelling, or nocturnal symptoms.  His morning stiffness has been lasting about 5 minutes daily. He denies any recent infections.       Activities of Daily Living:  Patient reports morning stiffness for 5 minutes.   Patient Denies nocturnal pain.  Difficulty dressing/grooming: Denies Difficulty climbing stairs: Denies Difficulty getting out of chair: Denies Difficulty using hands for taps, buttons, cutlery, and/or writing: Denies  Review of Systems  Constitutional: Negative for fatigue.  HENT: Negative for mouth dryness.   Eyes: Negative for dryness.  Respiratory: Negative for shortness of breath.   Cardiovascular: Negative for swelling in legs/feet.  Gastrointestinal: Negative for constipation.  Endocrine: Negative for excessive thirst.  Genitourinary: Negative for difficulty urinating.  Musculoskeletal: Positive for morning stiffness.  Skin: Negative for rash.  Allergic/Immunologic: Negative for susceptible to infections.  Neurological: Negative for numbness.  Hematological: Negative for bruising/bleeding  tendency.  Psychiatric/Behavioral: Negative for sleep disturbance.    PMFS History:  Patient Active Problem List   Diagnosis Date Noted  . Special screening for malignant neoplasms, colon   . Pain in right hip 06/20/2016  . High risk medication use 05/31/2016  . History of hypertension 05/31/2016  . History of sleep apnea 05/31/2016  . HLA B27 (HLA B27 positive) 05/31/2016  . Trochanteric bursitis of both hips 05/31/2016  . OSA (obstructive sleep apnea) 01/08/2016  . Overweight 01/08/2016  . Hyperlipidemia 01/08/2016  . Rheumatoid arthritis ([place]) 01/08/2016  . Cervicalgia 11/13/2013  . Sprain of neck 11/13/2013  . HTN (hypertension) 07/28/2013  . Mild obesity 07/28/2013    Past Medical History:  Diagnosis Date  . Hypertension   . Rheumatoid arthritis ([place])   . Seasonal allergies   . Sleep apnea    uses a cpap    Family History  Problem Relation Age of Onset  . Ovarian cancer Mother   . Cancer Mother        colon   . Diabetes Father   . Heart disease Father   . Healthy Sister   . Healthy Daughter   . Healthy Daughter    Past Surgical History:  Procedure Laterality Date  . CARDIAC CATHETERIZATION  05/09/2012   EF 55% Normal Coronary Arteries  . COLONOSCOPY N/A 04/04/2018   Procedure: COLONOSCOPY;  Surgeon: Danie Binder, MD;  [facility];  Service: Endoscopy;  Laterality: N/A;  1:00 - pt knows to arrive at 10:30  . DOPPLER ECHOCARDIOGRAPHY  09/18/2011   EF>55%  . EXCISION MORTON'S NEUROMA  03/06/2012   Procedure: EXCISION MORTON'S NEUROMA;  Surgeon: Wylene Simmer, MD;  Location: [facility];  Service: Orthopedics;  Laterality: Right;  EXCISION OF RIGHT THIRD WEB SPACE NEUROMA  HENDER RETRACTOR *[place] USED AS TOURNIQUET.  ON AT 1610.  OFF AT 0826.  Marland Kitchen GXT  09/18/2011  . LEFT HEART CATHETERIZATION WITH CORONARY ANGIOGRAM Bilateral 05/26/2012   Procedure: LEFT HEART CATHETERIZATION WITH CORONARY ANGIOGRAM;  Surgeon: Sanda Klein, MD;   Location: [facility];  Service: Cardiovascular;  Laterality: Bilateral;  . SYNOVECTOMY  03/06/2012   Procedure: SYNOVECTOMY;  Surgeon: Wylene Simmer, MD;  Location: [facility];  Service: Orthopedics;  Laterality: Right;  SYNOVECTOMY OF THIRD AND FOURTH METATARSAL PHALANGEAL JOINT  . WISDOM TOOTH EXTRACTION     Social History   Social History Narrative  . Not on file   Immunization History  Administered Date(s) Administered  . Influenza-Unspecified 02/26/2015, 03/28/2018  . Janssen (J&J) SARS-COV-2 Vaccination 11/06/2019  . Moderna Sars-Covid-2 Vaccination 04/27/2020     Objective: Vital Signs: BP 138/67 (BP Location: Left Arm, Patient Position: Sitting, Cuff Size: Normal)   Pulse 70   Resp 15   Ht 6' 2"  (1.88 m)   Wt 240 lb (108.9 kg)   BMI 30.81 kg/m    Physical Exam Vitals and nursing note reviewed.  Constitutional:      Appearance: He is well-developed.  HENT:     Head: Normocephalic and atraumatic.  Eyes:     Conjunctiva/sclera: Conjunctivae normal.     Pupils: Pupils are equal, round, and reactive to light.  Pulmonary:     Effort: Pulmonary effort is normal.  Abdominal:     Palpations: Abdomen is soft.  Musculoskeletal:     Cervical back: Normal range of motion and neck supple.  Skin:    General: Skin is warm and dry.     Capillary Refill: Capillary refill takes less than 2 seconds.  Neurological:     Mental Status: He is alert and oriented to person, place, and time.  Psychiatric:        Behavior: Behavior normal.      Musculoskeletal Exam: C-spine, thoracic spine, lumbar spine have good range of motion with no discomfort.  Shoulder joints, elbow joints, wrist joints, MCPs, PIPs, DIPs have good range of motion with no synovitis.  He is able to make a complete fist bilaterally.  Hip joints have good range of motion with no discomfort.  Knee joints have good range of motion no warmth or effusion.  Ankle joints have good range of motion without  discomfort.  CDAI Exam: CDAI Score: 0  Patient Global: 0 mm; Provider Global: 0 mm Swollen: 0 ; Tender: 0  Joint Exam 10/07/2020   No joint exam has been documented for this visit   There is currently no information documented on the homunculus. Go to the Rheumatology activity and complete the homunculus joint exam.  Investigation: No additional findings.  Imaging: No results found.  Recent Labs: Lab Results  Component Value Date   WBC 5.9 12/11/2016   HGB 14.3 12/11/2016   PLT 236 12/11/2016   NA 139 12/11/2016   K 4.2 12/11/2016   CL 103 12/11/2016   CO2 24 12/11/2016   GLUCOSE 88 12/11/2016   BUN 13 12/11/2016   CREATININE 1.11 12/11/2016   BILITOT 0.6 12/11/2016   ALKPHOS 54 12/11/2016   AST 20 12/11/2016   ALT 22 12/11/2016   PROT  7.1 12/11/2016   ALBUMIN 4.7 12/11/2016   CALCIUM 9.3 12/11/2016   GFRAA >89 12/11/2016   QFTBGOLDPLUS NEGATIVE 05/11/2020    Speciality Comments: Last Labs: 01/12/2020  Procedures:  No procedures performed Allergies: Patient has no known allergies.   Assessment / Plan:     Visit Diagnoses: Rheumatoid arthritis involving multiple sites with positive rheumatoid factor [facility]): He has no joint tenderness or synovitis on exam.  He has not had any recent rheumatoid arthritis flares.  He is clinically doing well taking Xeljanz 11 mg XR daily.  He has not missed any doses of Morrie Sheldon he continues to tolerate it without any side effects.  He has not had any recent infections.  We discussed the black box warning associated with taking a Jak Inhibitor.  He started taking aspirin 81 mg daily after his last office visit in December 2021.  He has not been experiencing any joint pain, joint swelling, or nocturnal symptoms.  His morning stiffness has been lasting about 5 minutes daily.  According to the patient he does not feel as though he has rheumatoid arthritis due to the effectiveness of Xeljanz.  He will continue on the current treatment regimen.   A refill of Morrie Sheldon will be sent to the pharmacy.  He was advised to notify us if he develops increased joint pain or joint swelling.  He will follow-up in the office in 5 months.  High risk medication use - Xeljanz XR 11 mg daily.CBC and CMP were drawn on 08/23/20.  Patient has history of lymphopenia.  His next lab work will be due in June and every 3 months to monitor for drug toxicity.  TB Gold negative on 05/11/2020 and will continue to be monitored yearly. Future order for lipid panel was placed today. Discussed the black box warning associated with taking a check in and better.  Patient was encouraged to modify potential risk factors.  He was advised to monitor his blood pressure closely.  He started taking aspirin 81 mg 1 tablet daily starting in December 2021. Association of heart disease with psoriatic arthritis was discussed. Need to monitor blood pressure, cholesterol, and to exercise 30-60 minutes on daily basis was discussed.  He has not had any recent infections.  He was advised to hold Morrie Sheldon if he develops signs or symptoms of an infection and to resume once the infection has completely cleared. Advised to reach out to PCP to receive shingrix vaccine.   HLA B27 (HLA B27 positive)  Plantar fasciitis, left: Resolved   Trochanteric bursitis of both hips - Resolved.  Other medical conditions are listed as follows:  OSA (obstructive sleep apnea)  History of hypertension  Mixed hyperlipidemia  Orders: No orders of the defined types were placed in this encounter.  No orders of the defined types were placed in this encounter.     Follow-Up Instructions: Return in about 5 months (around 03/09/2021) for Rheumatoid arthritis.   Ofilia Neas, PA-C  Note - This record has been created using Dragon software.  Chart creation errors have been sought, but may not always  have been located. Such creation errors do not reflect on  the standard of medical care.

## 2020-10-07 ENCOUNTER — Other Ambulatory Visit: Payer: Self-pay

## 2020-10-07 ENCOUNTER — Ambulatory Visit: Payer: BC Managed Care – PPO | Admitting: Physician Assistant

## 2020-10-07 ENCOUNTER — Encounter: Payer: Self-pay | Admitting: Physician Assistant

## 2020-10-07 VITALS — BP 138/67 | HR 70 | Resp 15 | Ht 74.0 in | Wt 240.0 lb

## 2020-10-07 DIAGNOSIS — M722 Plantar fascial fibromatosis: Secondary | ICD-10-CM | POA: Diagnosis not present

## 2020-10-07 DIAGNOSIS — Z79899 Other long term (current) drug therapy: Secondary | ICD-10-CM

## 2020-10-07 DIAGNOSIS — M7062 Trochanteric bursitis, left hip: Secondary | ICD-10-CM

## 2020-10-07 DIAGNOSIS — E782 Mixed hyperlipidemia: Secondary | ICD-10-CM

## 2020-10-07 DIAGNOSIS — Z1589 Genetic susceptibility to other disease: Secondary | ICD-10-CM | POA: Diagnosis not present

## 2020-10-07 DIAGNOSIS — M7061 Trochanteric bursitis, right hip: Secondary | ICD-10-CM

## 2020-10-07 DIAGNOSIS — M0579 Rheumatoid arthritis with rheumatoid factor of multiple sites without organ or systems involvement: Secondary | ICD-10-CM

## 2020-10-07 DIAGNOSIS — G4733 Obstructive sleep apnea (adult) (pediatric): Secondary | ICD-10-CM

## 2020-10-07 DIAGNOSIS — Z8679 Personal history of other diseases of the circulatory system: Secondary | ICD-10-CM

## 2020-10-07 MED ORDER — XELJANZ XR 11 MG PO TB24
1.0000 | ORAL_TABLET | Freq: Every day | ORAL | 0 refills | Status: DC
Start: 2020-10-07 — End: 2020-12-19

## 2020-10-07 NOTE — Patient Instructions (Signed)
Standing Labs We placed an order today for your standing lab work.   Please have your standing labs drawn in June and every 3 months   If possible, please have your labs drawn 2 weeks prior to your appointment so that the provider can discuss your results at your appointment.  We have open lab daily Monday through Thursday from 1:30-4:30 PM and Friday from 1:30-4:00 PM at the office of Dr. Shaili Deveshwar, [facility].   Please be advised, all patients with office appointments requiring lab work will take precedents over walk-in lab work.  If possible, please come for your lab work on Monday and Friday afternoons, as you may experience shorter wait times. The office is located at [address], [address], [place], [place] [address] No appointment is necessary.   Labs are drawn by [facility]. Please bring your co-pay at the time of your lab draw.  You may receive a bill from [facility] for your lab work.  If you wish to have your labs drawn at another location, please call the office 24 hours in advance to send orders.  If you have any questions regarding directions or hours of operation,  please call [phone].   As a reminder, please drink plenty of water prior to coming for your lab work. Thanks!   

## 2020-10-12 ENCOUNTER — Telehealth: Payer: Self-pay | Admitting: Pharmacy Technician

## 2020-10-12 ENCOUNTER — Ambulatory Visit: Payer: BC Managed Care – PPO | Admitting: Physician Assistant

## 2020-10-12 NOTE — Telephone Encounter (Signed)
Received notification from [facility] regarding a prior authorization for [facility]. Authorization has been APPROVED from 10/07/20 to 10/07/21.   Authorization # [id number] Phone # [phone]

## 2020-12-07 ENCOUNTER — Telehealth: Payer: Self-pay | Admitting: *Deleted

## 2020-12-07 NOTE — Telephone Encounter (Signed)
Labs received from:[facility] Drawn on:11/15/2020 Reviewed by:Sherron Ales, PA-C  Labs drawn:CMP, CBC w/diff, Vitamin D  Results: WBC    3.4 Absolute Lymphocytes 592

## 2020-12-18 ENCOUNTER — Other Ambulatory Visit: Payer: Self-pay | Admitting: Physician Assistant

## 2020-12-19 NOTE — Telephone Encounter (Signed)
Next Visit: 03/02/2021  Last Visit: 10/07/2020  Last Fill: 10/07/2020  ON:[id number] arthritis involving multiple sites with positive rheumatoid factor  Current Dose per office note 10/07/2020: Harriette Ohara XR 11 mg daily  Labs: 11/15/2020 WBC    3.4 Absolute Lymphocytes           592  TB Gold: 05/11/2020 Neg    Okay to refill Harriette Ohara?

## 2021-01-16 DIAGNOSIS — M25532 Pain in left wrist: Secondary | ICD-10-CM | POA: Diagnosis not present

## 2021-01-16 DIAGNOSIS — S5012XA Contusion of left forearm, initial encounter: Secondary | ICD-10-CM | POA: Diagnosis not present

## 2021-01-16 DIAGNOSIS — M79632 Pain in left forearm: Secondary | ICD-10-CM | POA: Diagnosis not present

## 2021-01-16 DIAGNOSIS — S60212A Contusion of left wrist, initial encounter: Secondary | ICD-10-CM | POA: Diagnosis not present

## 2021-01-23 ENCOUNTER — Telehealth: Payer: Self-pay

## 2021-01-23 DIAGNOSIS — Z79899 Other long term (current) drug therapy: Secondary | ICD-10-CM

## 2021-01-23 DIAGNOSIS — Z111 Encounter for screening for respiratory tuberculosis: Secondary | ICD-10-CM

## 2021-01-23 DIAGNOSIS — Z9225 Personal history of immunosupression therapy: Secondary | ICD-10-CM

## 2021-01-23 NOTE — Telephone Encounter (Signed)
Patient called requesting his labwork orders for TB gold be faxed to Clovis Riley at [place].  Patient states the fax number should be on file.  He states the labwork is not due yet, but is requesting the orders early so they can get approval.

## 2021-01-24 ENCOUNTER — Telehealth: Payer: Self-pay

## 2021-01-24 NOTE — Telephone Encounter (Signed)
Patient called stating Bobby Kramer fax number is #[phone]

## 2021-01-24 NOTE — Telephone Encounter (Signed)
Patient returned call with fax number. Elnita Maxwell Clark's fax number is #[phone]. Order for TB Gold faxed.

## 2021-01-24 NOTE — Telephone Encounter (Signed)
See previous phone note.  

## 2021-01-24 NOTE — Telephone Encounter (Signed)
Spoke with patient and advised we do not have the fax number on file. Patient states he will call back with the fax number. Patient advised we will fax orders once we have the fax number.

## 2021-02-17 ENCOUNTER — Telehealth: Payer: Self-pay | Admitting: *Deleted

## 2021-02-17 NOTE — Telephone Encounter (Signed)
Labs received from:[facility]   Drawn on:12/29/2020  Reviewed by:Dr. Pollyann Savoy   Labs drawn: CBC/CMP   Results:WNL

## 2021-02-17 NOTE — Progress Notes (Signed)
Office Visit Note  Patient: Bobby Kramer             Date of Birth: 04-Nov-1970           MRN: [id number]             PCP: Jake Samples, PA-C Referring: Jake Samples, [place]* Visit Date: 03/02/2021 Occupation: _0 @  Subjective:  Medication management.   History of Present Illness: Bobby Kramer is a 50 y.o. male with a history of seropositive rheumatoid arthritis.  He states he has been doing well on Xeljanz 11 mg p.o. daily.  He denies any history of joint swelling or joint pain.  He states he has some stiffness in his hands subcutaneous shortly which she describes over the PIP and DIP joints.  He has not had any recurrence of trochanteric bursitis or plantar fasciitis.  He denies any side effects from the medication.  He states that he has been getting labs at his work every 36month.  Activities of Daily Living:  Patient reports morning stiffness for 0 minutes.   Patient Denies nocturnal pain.  Difficulty dressing/grooming: Denies Difficulty climbing stairs: Denies Difficulty getting out of chair: Denies Difficulty using hands for taps, buttons, cutlery, and/or writing: Denies  Review of Systems  Constitutional:  Negative for fatigue.  HENT:  Negative for mouth sores, mouth dryness and nose dryness.   Eyes:  Negative for pain, itching and dryness.  Respiratory:  Negative for shortness of breath and difficulty breathing.   Cardiovascular:  Negative for chest pain and palpitations.  Gastrointestinal:  Negative for blood in stool, constipation and diarrhea.  Endocrine: Negative for increased urination.  Genitourinary:  Negative for difficulty urinating.  Musculoskeletal:  Negative for joint pain, joint pain, joint swelling, myalgias, morning stiffness, muscle tenderness and myalgias.  Skin:  Negative for color change, rash and redness.  Allergic/Immunologic: Negative for susceptible to infections.  Neurological:  Negative for dizziness, numbness, headaches, memory  loss and weakness.  Hematological:  Negative for bruising/bleeding tendency.  Psychiatric/Behavioral:  Negative for confusion.    PMFS History:  Patient Active Problem List   Diagnosis Date Noted   Special screening for malignant neoplasms, colon    Pain in right hip 06/20/2016   High risk medication use 05/31/2016   History of hypertension 05/31/2016   History of sleep apnea 05/31/2016   HLA B27 (HLA B27 positive) 05/31/2016   Trochanteric bursitis of both hips 05/31/2016   OSA (obstructive sleep apnea) 01/08/2016   Overweight 01/08/2016   Hyperlipidemia 01/08/2016   Rheumatoid arthritis ([place] 01/08/2016   Cervicalgia 11/13/2013   Sprain of neck 11/13/2013   HTN (hypertension) 07/28/2013   Mild obesity 07/28/2013    Past Medical History:  Diagnosis Date   Hypertension    Rheumatoid arthritis ([place]    Seasonal allergies    Sleep apnea    uses a cpap    Family History  Problem Relation Age of Onset   Ovarian cancer Mother    Cancer Mother        colon    Diabetes Father    Heart disease Father    Healthy Sister    Healthy Daughter    Healthy Daughter    Past Surgical History:  Procedure Laterality Date   CARDIAC CATHETERIZATION  05/09/2012   EF 55% Normal Coronary Arteries   COLONOSCOPY N/A 04/04/2018   Procedure: COLONOSCOPY;  Surgeon: FDanie Binder MD;  [facility];  Service: Endoscopy;  Laterality: N/A;  1:00 - pt knows to arrive at 10:30   DOPPLER ECHOCARDIOGRAPHY  09/18/2011   EF>55%   EXCISION MORTON'S NEUROMA  03/06/2012   Procedure: EXCISION MORTON'S NEUROMA;  Surgeon: Wylene Simmer, MD;  Location: [place];  Service: Orthopedics;  Laterality: Right;  EXCISION OF RIGHT THIRD WEB SPACE NEUROMA  HENDER RETRACTOR *[place] USED AS TOURNIQUET.  ON AT 4944.  OFF AT 0826.   GXT  09/18/2011   LEFT HEART CATHETERIZATION WITH CORONARY ANGIOGRAM Bilateral 05/26/2012   Procedure: LEFT HEART CATHETERIZATION WITH CORONARY ANGIOGRAM;  Surgeon:  Sanda Klein, MD;  Location: [place] C[facility];  Service: Cardiovascular;  Laterality: Bilateral;   SYNOVECTOMY  03/06/2012   Procedure: SYNOVECTOMY;  Surgeon: Wylene Simmer, MD;  Location: [place];  Service: Orthopedics;  Laterality: Right;  SYNOVECTOMY OF THIRD AND FOURTH METATARSAL PHALANGEAL JOINT   WISDOM TOOTH EXTRACTION     Social History   Social History Narrative   Not on file   Immunization History  Administered Date(s) Administered   Influenza-Unspecified 02/26/2015, 03/28/2018   Janssen (J&J) SARS-COV-2 Vaccination 11/06/2019   Moderna Sars-Covid-2 Vaccination 04/27/2020     Objective: Vital Signs: BP (!) 142/79 (BP Location: Left Arm, Patient Position: Sitting, Cuff Size: Large)   Pulse 60   Ht _0  (1.88 m)   Wt 244 lb 9.6 oz (110.9 kg)   BMI 31.40 kg/m    Physical Exam Vitals and nursing note reviewed.  Constitutional:      Appearance: He is well-developed.  HENT:     Head: Normocephalic and atraumatic.  Eyes:     Conjunctiva/sclera: Conjunctivae normal.     Pupils: Pupils are equal, round, and reactive to light.  Cardiovascular:     Rate and Rhythm: Normal rate and regular rhythm.     Heart sounds: Normal heart sounds.  Pulmonary:     Effort: Pulmonary effort is normal.     Breath sounds: Normal breath sounds.  Abdominal:     General: Bowel sounds are normal.     Palpations: Abdomen is soft.  Musculoskeletal:     Cervical back: Normal range of motion and neck supple.  Skin:    General: Skin is warm and dry.     Capillary Refill: Capillary refill takes less than 2 seconds.  Neurological:     Mental Status: He is alert and oriented to person, place, and time.  Psychiatric:        Behavior: Behavior normal.     Musculoskeletal Exam: C-spine was in good range of motion.  Shoulder joints, elbow joints, wrist joints, MCPs PIPs and DIPs with good range of motion with no synovitis.  Hip joints, knee joints, ankles, MTPs and PIPs with good  range of motion with no synovitis.  CDAI Exam: CDAI Score: 0.2  Patient Global: 1 mm; Provider Global: 1 mm Swollen: 0 ; Tender: 0  Joint Exam 03/02/2021   No joint exam has been documented for this visit   There is currently no information documented on the homunculus. Go to the Rheumatology activity and complete the homunculus joint exam.  Investigation: No additional findings.  Imaging: No results found.  Recent Labs: Lab Results  Component Value Date   WBC 5.9 12/11/2016   HGB 14.3 12/11/2016   PLT 236 12/11/2016   NA 139 12/11/2016   K 4.2 12/11/2016   CL 103 12/11/2016   CO2 24 12/11/2016   GLUCOSE 88 12/11/2016   BUN 13 12/11/2016   CREATININE 1.11 12/11/2016  BILITOT 0.6 12/11/2016   ALKPHOS 54 12/11/2016   AST 20 12/11/2016   ALT 22 12/11/2016   PROT 7.1 12/11/2016   ALBUMIN 4.7 12/11/2016   CALCIUM 9.3 12/11/2016   GFRAA >89 12/11/2016   QFTBGOLDPLUS NEGATIVE 05/11/2020    Speciality Comments: Last Labs: 01/12/2020  Procedures:  No procedures performed Allergies: Patient has no known allergies.   Assessment / Plan:     Visit Diagnoses: Rheumatoid arthritis involving multiple sites with positive rheumatoid factor (HCC)-he has been doing well without any joint pain or joint swelling.  There was no synovitis on examination.  He has been tolerating Xeljanz without any side effects.  Side effects of Morrie Sheldon were again reviewed.  High risk medication use - Xeljanz XR 11 mg daily.  He states he had labs in September which she will forward to [place].  His TB Gold is due in December 2022 for which he will come here.  Increased risk of Mace and intestinal perforation was also discussed.  He was advised to stop Morrie Sheldon in case he develops an infection and resume after the infection resolves.  He is also planning to get COVID-19 vaccination and flu vaccine.  I advised him to hold [facility] for 1 week after getting the COVID-19 vaccination.    HLA B27 (HLA B27 positive)-he  had no spinal or SI joint pain.  Plantar fasciitis, left - Resolved.  Trochanteric bursitis of both hips - Resolved.  OSA (obstructive sleep apnea)  Mixed hyperlipidemia-he is on Crestor.  He gets lipid panel at his work.  He will forward those results.  History of hypertension-his blood pressure was mildly elevated.  Increased risk of heart disease with rheumatoid arthritis was also discussed.  Dietary modifications and exercise were asked emphasized.  Orders: No orders of the defined types were placed in this encounter.  No orders of the defined types were placed in this encounter.   Follow-Up Instructions: Return in about 5 months (around 07/31/2021) for Rheumatoid arthritis.   Bo Merino, MD  Note - This record has been created using Editor, commissioning.  Chart creation errors have been sought, but may not always  have been located. Such creation errors do not reflect on  the standard of medical care.

## 2021-02-19 ENCOUNTER — Other Ambulatory Visit: Payer: Self-pay | Admitting: Cardiovascular Disease

## 2021-03-02 ENCOUNTER — Encounter: Payer: Self-pay | Admitting: Rheumatology

## 2021-03-02 ENCOUNTER — Other Ambulatory Visit: Payer: Self-pay

## 2021-03-02 ENCOUNTER — Ambulatory Visit: Payer: BC Managed Care – PPO | Admitting: Rheumatology

## 2021-03-02 VITALS — BP 142/79 | HR 60 | Ht 74.0 in | Wt 244.6 lb

## 2021-03-02 DIAGNOSIS — Z79899 Other long term (current) drug therapy: Secondary | ICD-10-CM

## 2021-03-02 DIAGNOSIS — G4733 Obstructive sleep apnea (adult) (pediatric): Secondary | ICD-10-CM

## 2021-03-02 DIAGNOSIS — M722 Plantar fascial fibromatosis: Secondary | ICD-10-CM

## 2021-03-02 DIAGNOSIS — Z8679 Personal history of other diseases of the circulatory system: Secondary | ICD-10-CM

## 2021-03-02 DIAGNOSIS — Z1589 Genetic susceptibility to other disease: Secondary | ICD-10-CM | POA: Diagnosis not present

## 2021-03-02 DIAGNOSIS — E782 Mixed hyperlipidemia: Secondary | ICD-10-CM | POA: Diagnosis not present

## 2021-03-02 DIAGNOSIS — M0579 Rheumatoid arthritis with rheumatoid factor of multiple sites without organ or systems involvement: Secondary | ICD-10-CM | POA: Diagnosis not present

## 2021-03-02 DIAGNOSIS — M7062 Trochanteric bursitis, left hip: Secondary | ICD-10-CM

## 2021-03-02 DIAGNOSIS — M7061 Trochanteric bursitis, right hip: Secondary | ICD-10-CM

## 2021-03-02 NOTE — Patient Instructions (Signed)
Standing Labs We placed an order today for your standing lab work.   Please have your standing labs drawn in December and every 3 months (if you had labs in September, please get labs faxed to [place]) TB Gold is due in December  If possible, please have your labs drawn 2 weeks prior to your appointment so that the provider can discuss your results at your appointment.  Please note that you may see your imaging and lab results in MyChart before we have reviewed them. We may be awaiting multiple results to interpret others before contacting you. Please allow our office up to 72 hours to thoroughly review all of the results before contacting the office for clarification of your results.  We have open lab daily: Monday through Thursday from 1:30-4:30 PM and Friday from 1:30-4:00 PM at the office of Dr. Pollyann Savoy, [facility].   Please be advised, all patients with office appointments requiring lab work will take precedent over walk-in lab work.  If possible, please come for your lab work on Monday and Friday afternoons, as you may experience shorter wait times. The office is located at [address], [address], [place], [place] [address] No appointment is necessary.   Labs are drawn by [facility]. Please bring your co-pay at the time of your lab draw.  You may receive a bill from [facility] for your lab work.  If you wish to have your labs drawn at another location, please call the office 24 hours in advance to send orders.  If you have any questions regarding directions or hours of operation,  please call [phone].   As a reminder, please drink plenty of water prior to coming for your lab work. Thanks!   Vaccines You are taking a medication(s) that can suppress your immune system.  The following immunizations are recommended: Flu annually Covid-19  Td/Tdap (tetanus, diphtheria, pertussis) every 10 years Pneumonia (Prevnar 15 then Pneumovax 23 at least 1 year apart.   Alternatively, can take Prevnar 20 without needing additional dose) Shingrix: 2 doses from 4 weeks to 6 months apart  Please check with your PCP to make sure you are up to date.  If you have signs or symptoms of an infection or start antibiotics: First, call your PCP for workup of your infection. Hold your medication through the infection, until you complete your antibiotics, and until symptoms resolve if you take the following: Injectable medication (Actemra, Benlysta, Cimzia, Cosentyx, Enbrel, Humira, Kevzara, Orencia, Remicade, Simponi, Stelara, Taltz, Tremfya) Methotrexate Leflunomide (Arava) Mycophenolate (Cellcept) Harriette Ohara, Olumiant, or Rinvoq   Heart Disease Prevention   Your inflammatory disease increases your risk of heart disease which includes heart attack, stroke, atrial fibrillation (irregular heartbeats), high blood pressure, heart failure and atherosclerosis (plaque in the arteries).  It is important to reduce your risk by:   Keep blood pressure, cholesterol, and blood sugar at healthy levels   Smoking Cessation   Maintain a healthy weight  BMI 20-25   Eat a healthy diet  Plenty of fresh fruit, vegetables, and whole grains  Limit saturated fats, foods high in sodium, and added sugars  DASH and Mediterranean diet   Increase physical activity  Recommend moderate physically activity for 150 minutes per week/ 30 minutes a day for five days a week These can be broken up into three separate ten-minute sessions during the day.   Reduce Stress  Meditation, slow breathing exercises, yoga, coloring books  Dental visits twice a year

## 2021-03-07 DIAGNOSIS — Z23 Encounter for immunization: Secondary | ICD-10-CM | POA: Diagnosis not present

## 2021-04-04 ENCOUNTER — Other Ambulatory Visit: Payer: Self-pay | Admitting: Cardiovascular Disease

## 2021-05-04 ENCOUNTER — Other Ambulatory Visit: Payer: Self-pay | Admitting: Cardiovascular Disease

## 2021-05-31 ENCOUNTER — Other Ambulatory Visit: Payer: Self-pay | Admitting: Cardiovascular Disease

## 2021-06-05 ENCOUNTER — Other Ambulatory Visit: Payer: Self-pay | Admitting: Physician Assistant

## 2021-06-28 DIAGNOSIS — D485 Neoplasm of uncertain behavior of skin: Secondary | ICD-10-CM | POA: Diagnosis not present

## 2021-06-28 DIAGNOSIS — L739 Follicular disorder, unspecified: Secondary | ICD-10-CM | POA: Diagnosis not present

## 2021-06-28 DIAGNOSIS — L570 Actinic keratosis: Secondary | ICD-10-CM | POA: Diagnosis not present

## 2021-07-05 ENCOUNTER — Other Ambulatory Visit: Payer: Self-pay | Admitting: Physician Assistant

## 2021-07-06 ENCOUNTER — Telehealth: Payer: Self-pay | Admitting: *Deleted

## 2021-07-06 ENCOUNTER — Other Ambulatory Visit: Payer: Self-pay

## 2021-07-06 MED ORDER — XELJANZ XR 11 MG PO TB24: 1.0000 | ORAL_TABLET | Freq: Every day | ORAL | 0 refills | Status: DC

## 2021-07-06 NOTE — Telephone Encounter (Signed)
Labs received from:[facility]  Drawn on:04/25/2021  Reviewed by:Sherron Ales, PA-C  Labs drawn:CMP, CBC, Lipid Panel  Results:WNL

## 2021-07-06 NOTE — Telephone Encounter (Signed)
Next Visit: 07/27/2021  Last Visit: 03/02/2021  Last Fill: 12/19/2020  VT:VNRWCHJSCB arthritis involving multiple sites with positive rheumatoid factor  Current Dose per office note 03/02/2021: Harriette Ohara XR 11 mg daily.  Labs: 04/25/2021 WNL  TB Gold: 05/11/2020 Neg    Okay to refill Harriette Ohara?

## 2021-07-06 NOTE — Telephone Encounter (Signed)
Patient left a voicemail stating he is faxing labwork results to the office this afternoon.  Patient states he only has 4 days remaining of his medication and requesting a refill to be sent to [facility].

## 2021-07-06 NOTE — Telephone Encounter (Signed)
Patient is due to update TB gold. CBC and CMP are also due this month.

## 2021-07-13 NOTE — Progress Notes (Signed)
Office Visit Note  Patient: Bobby Kramer             Date of Birth: 24-Mar-1971           MRN: [id number]             PCP: Jake Samples, PA-C Referring: Jake Samples, [place]* Visit Date: 07/27/2021 Occupation: @GUAROCC @  Subjective:  Medication monitoring  History of Present Illness: Bobby Kramer is a 51 y.o. male with history of seropositive rheumatoid arthritis.  He remains on Xeljanz 11 mg XR daily.  He is tolerating Morrie Sheldon without any side effects.  He has not had any recent infections.  He denies any signs or symptoms of a rheumatoid arthritis flare.  He has not experienced any joint pain or joint swelling at this time.  He denies any nocturnal pain.  He has not had any difficulty with ADLs.  He denies any new concerns at this time.  He would like to have updated lab work today.   Activities of Daily Living:  Patient reports morning stiffness for 5 minutes.   Patient Denies nocturnal pain.  Difficulty dressing/grooming: Denies Difficulty climbing stairs: Denies Difficulty getting out of chair: Denies Difficulty using hands for taps, buttons, cutlery, and/or writing: Denies  Review of Systems  Constitutional:  Negative for fatigue.  HENT:  Negative for mouth sores, mouth dryness and nose dryness.   Eyes:  Positive for dryness. Negative for pain and itching.  Respiratory:  Negative for shortness of breath and difficulty breathing.   Cardiovascular:  Negative for chest pain and palpitations.  Gastrointestinal:  Negative for blood in stool, constipation and diarrhea.  Endocrine: Negative for increased urination.  Genitourinary:  Negative for difficulty urinating.  Musculoskeletal:  Positive for morning stiffness. Negative for joint pain, joint pain, joint swelling, myalgias, muscle tenderness and myalgias.  Skin:  Negative for color change, rash and redness.  Allergic/Immunologic: Negative for susceptible to infections.  Neurological:  Negative for dizziness,  numbness, headaches, memory loss and weakness.  Hematological:  Negative for bruising/bleeding tendency.  Psychiatric/Behavioral:  Negative for confusion.    PMFS History:  Patient Active Problem List   Diagnosis Date Noted   Special screening for malignant neoplasms, colon    Pain in right hip 06/20/2016   High risk medication use 05/31/2016   History of hypertension 05/31/2016   History of sleep apnea 05/31/2016   HLA B27 (HLA B27 positive) 05/31/2016   Trochanteric bursitis of both hips 05/31/2016   OSA (obstructive sleep apnea) 01/08/2016   Overweight 01/08/2016   Hyperlipidemia 01/08/2016   Rheumatoid arthritis ([place]) 01/08/2016   Cervicalgia 11/13/2013   Sprain of neck 11/13/2013   HTN (hypertension) 07/28/2013   Mild obesity 07/28/2013    Past Medical History:  Diagnosis Date   Hypertension    Rheumatoid arthritis ([place])    Seasonal allergies    Sleep apnea    uses a cpap    Family History  Problem Relation Age of Onset   Ovarian cancer Mother    Cancer Mother        colon    Diabetes Father    Heart disease Father    Healthy Sister    Healthy Daughter    Healthy Daughter    Past Surgical History:  Procedure Laterality Date   CARDIAC CATHETERIZATION  05/09/2012   EF 55% Normal Coronary Arteries   COLONOSCOPY N/A 04/04/2018   Procedure: COLONOSCOPY;  Surgeon: Danie Binder, MD;  [facility];  Service: Endoscopy;  Laterality: N/A;  1:00 - pt knows to arrive at 10:30   DOPPLER ECHOCARDIOGRAPHY  09/18/2011   EF>55%   EXCISION MORTON'S NEUROMA  03/06/2012   Procedure: EXCISION MORTON'S NEUROMA;  Surgeon: Wylene Simmer, MD;  Location: [place];  Service: Orthopedics;  Laterality: Right;  EXCISION OF RIGHT THIRD WEB SPACE NEUROMA  HENDER RETRACTOR *[place] USED AS TOURNIQUET.  ON AT 5883.  OFF AT 0826.   GXT  09/18/2011   LEFT HEART CATHETERIZATION WITH CORONARY ANGIOGRAM Bilateral 05/26/2012   Procedure: LEFT HEART CATHETERIZATION WITH  CORONARY ANGIOGRAM;  Surgeon: Sanda Klein, MD;  Location: [place] CATH LAB;  Service: Cardiovascular;  Laterality: Bilateral;   SYNOVECTOMY  03/06/2012   Procedure: SYNOVECTOMY;  Surgeon: Wylene Simmer, MD;  Location: [place];  Service: Orthopedics;  Laterality: Right;  SYNOVECTOMY OF THIRD AND FOURTH METATARSAL PHALANGEAL JOINT   WISDOM TOOTH EXTRACTION     Social History   Social History Narrative   Not on file   Immunization History  Administered Date(s) Administered   Influenza-Unspecified 02/26/2015, 03/28/2018   Janssen (J&J) SARS-COV-2 Vaccination 11/06/2019   Moderna Sars-Covid-2 Vaccination 04/27/2020     Objective: Vital Signs: BP (!) 145/89 (BP Location: Left Arm, Patient Position: Sitting, Cuff Size: Normal)    Pulse (!) 59    Ht 6' 2"  (1.88 m)    Wt 242 lb 9.6 oz (110 kg)    BMI 31.15 kg/m    Physical Exam Vitals and nursing note reviewed.  Constitutional:      Appearance: He is well-developed.  HENT:     Head: Normocephalic and atraumatic.  Eyes:     Conjunctiva/sclera: Conjunctivae normal.     Pupils: Pupils are equal, round, and reactive to light.  Cardiovascular:     Rate and Rhythm: Normal rate and regular rhythm.     Heart sounds: Normal heart sounds.  Pulmonary:     Effort: Pulmonary effort is normal.     Breath sounds: Normal breath sounds.  Abdominal:     General: Bowel sounds are normal.     Palpations: Abdomen is soft.  Musculoskeletal:     Cervical back: Normal range of motion and neck supple.  Skin:    General: Skin is warm and dry.     Capillary Refill: Capillary refill takes less than 2 seconds.  Neurological:     Mental Status: He is alert and oriented to person, place, and time.  Psychiatric:        Behavior: Behavior normal.     Musculoskeletal Exam: C-spine, thoracic spine, lumbar spine have good range of motion with no discomfort.  Shoulder joints, elbow joints, wrist joints, MCPs, PIPs, DIPs have good range of motion  with no synovitis.  Complete fist formation bilaterally.  Hip joints have good range of motion with no groin pain.  Knee joints have good range of motion with no warmth or effusion.  Ankle joints have good range of motion with no joint tenderness.  CDAI Exam: CDAI Score: 0.2  Patient Global: 1 mm; Provider Global: 1 mm Swollen: 0 ; Tender: 0  Joint Exam 07/27/2021   No joint exam has been documented for this visit   There is currently no information documented on the homunculus. Go to the Rheumatology activity and complete the homunculus joint exam.  Investigation: No additional findings.  Imaging: No results found.  Recent Labs: Lab Results  Component Value Date   WBC 5.9 12/11/2016   HGB 14.3 12/11/2016  PLT 236 12/11/2016   NA 139 12/11/2016   K 4.2 12/11/2016   CL 103 12/11/2016   CO2 24 12/11/2016   GLUCOSE 88 12/11/2016   BUN 13 12/11/2016   CREATININE 1.11 12/11/2016   BILITOT 0.6 12/11/2016   ALKPHOS 54 12/11/2016   AST 20 12/11/2016   ALT 22 12/11/2016   PROT 7.1 12/11/2016   ALBUMIN 4.7 12/11/2016   CALCIUM 9.3 12/11/2016   GFRAA >89 12/11/2016   QFTBGOLDPLUS NEGATIVE 05/11/2020    Speciality Comments: Last Labs: 01/12/2020  Procedures:  No procedures performed Allergies: Patient has no known allergies.    Assessment / Plan:     Visit Diagnoses: Rheumatoid arthritis involving multiple sites with positive rheumatoid factor [facility]): He has no joint tenderness or synovitis on examination.  He has not had any signs or symptoms of a rheumatoid arthritis flare.  He is clinically doing well taking Xeljanz 11 mg XR by mouth daily.  He continues to tolerate Morrie Sheldon without any side effects and has not missed any doses recently.  He has not been experiencing any nocturnal pain or difficulty with ADLs.  He will remain on Xeljanz as monotherapy.  A refill of Morrie Sheldon will be sent to the pharmacy today.  He is advised to notify us if he develops increased joint pain or  joint swelling.  He will follow-up in the office in 5 months.  High risk medication use - Xeljanz XR 11 mg daily.  CBC, CMP, lipid panel updated on 04/25/2021.  He is due to update lab work.  Orders for CBC and CMP were released. TB Gold negative on on 05/11/2020.  Order for TB gold released today. - Plan: CBC with Differential/Platelet, COMPLETE METABOLIC PANEL WITH GFR, QuantiFERON-TB Gold Plus He has not had any recent infections.  Discussed the importance of holding Morrie Sheldon if he develops signs or symptoms of an infection and to resume once the infection is completely cleared. Discussed the black box warning associated with taking a Jak inhibitor including increased risk for major cardiovascular events and malignancy.  Discussed the importance of close blood pressure monitoring as well as remaining on Crestor as prescribed. Association of heart disease with rheumatoid arthritis was discussed. Need to monitor blood pressure, cholesterol, and to exercise 30-60 minutes on daily basis was discussed. Poor dental hygiene can be a predisposing factor for rheumatoid arthritis. Good dental hygiene was discussed.   Screening for tuberculosis -Order for TB gold released today.  Plan: QuantiFERON-TB Gold Plus  HLA B27 (HLA B27 positive)  Plantar fasciitis, left - Resolved.  Trochanteric bursitis of both hips - Resolved.  Other medical conditions are listed as follows:  OSA (obstructive sleep apnea)  Mixed hyperlipidemia: He remains on crestor as prescribed.   History of hypertension: Discussed the importance of close blood pressure monitoring.    Orders: Orders Placed This Encounter  Procedures   CBC with Differential/Platelet   COMPLETE METABOLIC PANEL WITH GFR   QuantiFERON-TB Gold Plus   No orders of the defined types were placed in this encounter.    Follow-Up Instructions: Return in about 5 months (around 12/27/2021) for Rheumatoid arthritis.   Ofilia Neas, PA-C  Note - This  record has been created using Dragon software.  Chart creation errors have been sought, but may not always  have been located. Such creation errors do not reflect on  the standard of medical care.

## 2021-07-27 ENCOUNTER — Encounter: Payer: Self-pay | Admitting: Physician Assistant

## 2021-07-27 ENCOUNTER — Ambulatory Visit: Payer: BC Managed Care – PPO | Admitting: Physician Assistant

## 2021-07-27 ENCOUNTER — Other Ambulatory Visit: Payer: Self-pay

## 2021-07-27 VITALS — BP 145/89 | HR 59 | Ht 74.0 in | Wt 242.6 lb

## 2021-07-27 DIAGNOSIS — M0579 Rheumatoid arthritis with rheumatoid factor of multiple sites without organ or systems involvement: Secondary | ICD-10-CM

## 2021-07-27 DIAGNOSIS — M7061 Trochanteric bursitis, right hip: Secondary | ICD-10-CM

## 2021-07-27 DIAGNOSIS — Z8679 Personal history of other diseases of the circulatory system: Secondary | ICD-10-CM

## 2021-07-27 DIAGNOSIS — M7062 Trochanteric bursitis, left hip: Secondary | ICD-10-CM

## 2021-07-27 DIAGNOSIS — M722 Plantar fascial fibromatosis: Secondary | ICD-10-CM | POA: Diagnosis not present

## 2021-07-27 DIAGNOSIS — Z111 Encounter for screening for respiratory tuberculosis: Secondary | ICD-10-CM

## 2021-07-27 DIAGNOSIS — E782 Mixed hyperlipidemia: Secondary | ICD-10-CM

## 2021-07-27 DIAGNOSIS — Z79899 Other long term (current) drug therapy: Secondary | ICD-10-CM

## 2021-07-27 DIAGNOSIS — Z1589 Genetic susceptibility to other disease: Secondary | ICD-10-CM

## 2021-07-27 DIAGNOSIS — G4733 Obstructive sleep apnea (adult) (pediatric): Secondary | ICD-10-CM

## 2021-07-27 MED ORDER — XELJANZ XR 11 MG PO TB24: 1.0000 | ORAL_TABLET | Freq: Every day | ORAL | 0 refills | Status: DC

## 2021-07-27 NOTE — Patient Instructions (Signed)
Standing Labs ?We placed an order today for your standing lab work.  ? ?Please have your standing labs drawn in June and every 3 months  ? ?If possible, please have your labs drawn 2 weeks prior to your appointment so that the provider can discuss your results at your appointment. ? ?Please note that you may see your imaging and lab results in MyChart before we have reviewed them. ?We may be awaiting multiple results to interpret others before contacting you. ?Please allow our office up to 72 hours to thoroughly review all of the results before contacting the office for clarification of your results. ? ?We have open lab daily: ?Monday through Thursday from 1:30-4:30 PM and Friday from 1:30-4:00 PM ?at the office of Dr. Shaili Deveshwar, [facility].   ?Please be advised, all patients with office appointments requiring lab work will take precedent over walk-in lab work.  ?If possible, please come for your lab work on Monday and Friday afternoons, as you may experience shorter wait times. ?The office is located at [address], [address], [place], [place] [address] ?No appointment is necessary.   ?Labs are drawn by [facility]. Please bring your co-pay at the time of your lab draw.  You may receive a bill from [facility] for your lab work. ? ?Please note if you are on Hydroxychloroquine and and an order has been placed for a Hydroxychloroquine level, you will need to have it drawn 4 hours or more after your last dose. ? ?If you wish to have your labs drawn at another location, please call the office 24 hours in advance to send orders. ? ?If you have any questions regarding directions or hours of operation,  ?please call [phone].   ?As a reminder, please drink plenty of water prior to coming for your lab work. Thanks! ? ?

## 2021-07-27 NOTE — Telephone Encounter (Signed)
Xeljanz refill pended. Please review and send to the pharmacy. Thanks!  °

## 2021-07-28 NOTE — Progress Notes (Signed)
CBC and CMP WNL

## 2021-07-31 LAB — COMPLETE METABOLIC PANEL WITH GFR
AG Ratio: 2.3 (calc) (ref 1.0–2.5)
ALT: 27 U/L (ref 9–46)
AST: 26 U/L (ref 10–35)
Albumin: 4.5 g/dL (ref 3.6–5.1)
Alkaline phosphatase (APISO): 63 U/L (ref 35–144)
BUN: 14 mg/dL (ref 7–25)
CO2: 27 mmol/L (ref 20–32)
Calcium: 9.4 mg/dL (ref 8.6–10.3)
Chloride: 106 mmol/L (ref 98–110)
Creat: 1.16 mg/dL (ref 0.70–1.30)
Globulin: 2 g/dL (calc) (ref 1.9–3.7)
Glucose, Bld: 99 mg/dL (ref 65–99)
Potassium: 4.4 mmol/L (ref 3.5–5.3)
Sodium: 140 mmol/L (ref 135–146)
Total Bilirubin: 0.5 mg/dL (ref 0.2–1.2)
Total Protein: 6.5 g/dL (ref 6.1–8.1)
eGFR: 77 mL/min/{1.73_m2} (ref >=60)

## 2021-07-31 LAB — CBC WITH DIFFERENTIAL/PLATELET
Absolute Monocytes: 504 cells/uL (ref 200–950)
Basophils Absolute: 21 cells/uL (ref 0–200)
Basophils Relative: 0.5 %
Eosinophils Absolute: 90 cells/uL (ref 15–500)
Eosinophils Relative: 2.2 %
HCT: 41.6 % (ref 38.5–50.0)
Hemoglobin: 13.9 g/dL (ref 13.2–17.1)
Lymphs Abs: 1091 cells/uL (ref 850–3900)
MCH: 30.2 pg (ref 27.0–33.0)
MCHC: 33.4 g/dL (ref 32.0–36.0)
MCV: 90.4 fL (ref 80.0–100.0)
MPV: 9.2 fL (ref 7.5–12.5)
Monocytes Relative: 12.3 %
Neutro Abs: 2394 cells/uL (ref 1500–7800)
Neutrophils Relative %: 58.4 %
Platelets: 209 10*3/uL (ref 140–400)
RBC: 4.6 10*6/uL (ref 4.20–5.80)
RDW: 12.5 % (ref 11.0–15.0)
Total Lymphocyte: 26.6 %
WBC: 4.1 10*3/uL (ref 3.8–10.8)

## 2021-07-31 LAB — QUANTIFERON-TB GOLD PLUS
Mitogen-NIL: >10 IU/mL
NIL: 0.03 IU/mL
QuantiFERON-TB Gold Plus: NEGATIVE
TB1-NIL: 0.01 IU/mL
TB2-NIL: 0 IU/mL

## 2021-08-01 NOTE — Progress Notes (Signed)
TB gold negative

## 2021-08-27 ENCOUNTER — Encounter: Payer: Self-pay | Admitting: Emergency Medicine

## 2021-08-27 ENCOUNTER — Ambulatory Visit
Admission: EM | Admit: 2021-08-27 | Discharge: 2021-08-27 | Disposition: A | Discharge status: Home / Self Care | Payer: BC Managed Care – PPO | Attending: Urgent Care | Admitting: Urgent Care

## 2021-08-27 DIAGNOSIS — S76312A Strain of muscle, fascia and tendon of the posterior muscle group at thigh level, left thigh, initial encounter: Secondary | ICD-10-CM

## 2021-08-27 DIAGNOSIS — M79605 Pain in left leg: Secondary | ICD-10-CM | POA: Diagnosis not present

## 2021-08-27 MED ORDER — TIZANIDINE HCL 4 MG PO TABS: 4.0000 mg | ORAL_TABLET | Freq: Every day | ORAL | 0 refills | Status: DC

## 2021-08-27 MED ORDER — MELOXICAM 15 MG PO TABS: 15.0000 mg | ORAL_TABLET | Freq: Every day | ORAL | 1 refills | Status: DC

## 2021-08-27 NOTE — ED Provider Notes (Signed)
?[place] ? ? ?MRN: [id number] DOB: 11-23-1970 ? ?Subjective:  ? ?Bobby Kramer is a 51 y.o. male presenting for 1 day history of acute onset left thigh pain that is severe. Patient was playing basketball at the time, felt a pop and had immediate severe pain. No bruising, swelling. Has been using ibuprofen, Ace wrap.  Patient is able to walk and bear weight but with significant difficulty.  He is not using any assistive devices. ? ?No current facility-administered medications for this encounter. ? ?Current Outpatient Medications:  ?  amLODipine-olmesartan (AZOR) 5-20 MG tablet, TAKE 1 TABLET BY MOUTH DAILY, Disp: 30 tablet, Rfl: 11 ?  aspirin 81 MG EC tablet, 1 tablet, Disp: , Rfl:  ?  hydrochlorothiazide (MICROZIDE) 12.5 MG capsule, TAKE 1 CAPSULE BY MOUTH  EVERY OTHER DAY, Disp: 45 capsule, Rfl: 3 ?  Multiple Vitamin (MULTIVITAMIN) tablet, Take 1 tablet by mouth daily., Disp: , Rfl:  ?  rosuvastatin (CRESTOR) 5 MG tablet, Take 5 mg by mouth daily., Disp: , Rfl:  ?  Tofacitinib Citrate ER (XELJANZ XR) 11 MG TB24, Take 1 tablet by mouth daily., Disp: 90 tablet, Rfl: 0  ? ?No Known Allergies ? ?Past Medical History:  ?Diagnosis Date  ? Hypertension   ? Rheumatoid arthritis ([place])   ? Seasonal allergies   ? Sleep apnea   ? uses a cpap  ?  ? ?Past Surgical History:  ?Procedure Laterality Date  ? CARDIAC CATHETERIZATION  05/09/2012  ? EF 55% Normal Coronary Arteries  ? COLONOSCOPY N/A 04/04/2018  ? Procedure: COLONOSCOPY;  Surgeon: Danie Binder, MD;  [facility];  Service: Endoscopy;  Laterality: N/A;  1:00 - pt knows to arrive at 10:30  ? DOPPLER ECHOCARDIOGRAPHY  09/18/2011  ? EF>55%  ? EXCISION MORTON'S NEUROMA  03/06/2012  ? Procedure: EXCISION MORTON'S NEUROMA;  Surgeon: Wylene Simmer, MD;  Location: [facility];  Service: Orthopedics;  Laterality: Right;  EXCISION OF RIGHT THIRD WEB SPACE NEUROMA  ?HENDER RETRACTOR ?*[place] USED AS TOURNIQUET.  ON AT [id number].  OFF AT [id number].   ? GXT  09/18/2011  ? LEFT HEART CATHETERIZATION WITH CORONARY ANGIOGRAM Bilateral 05/26/2012  ? Procedure: LEFT HEART CATHETERIZATION WITH CORONARY ANGIOGRAM;  Surgeon: Sanda Klein, MD;  Location: [facility];  Service: Cardiovascular;  Laterality: Bilateral;  ? SYNOVECTOMY  03/06/2012  ? Procedure: SYNOVECTOMY;  Surgeon: Wylene Simmer, MD;  Location: [facility];  Service: Orthopedics;  Laterality: Right;  SYNOVECTOMY OF THIRD AND FOURTH METATARSAL PHALANGEAL JOINT  ? WISDOM TOOTH EXTRACTION    ? ? ?Family History  ?Problem Relation Age of Onset  ? Ovarian cancer Mother   ? Cancer Mother   ?     colon   ? Diabetes Father   ? Heart disease Father   ? Healthy Sister   ? Healthy Daughter   ? Healthy Daughter   ? ? ?Social History  ? ?Tobacco Use  ? Smoking status: Former  ?  Types: Cigarettes  ?  Quit date: 03/05/2007  ?  Years since quitting: 14.4  ?  Passive exposure: Never  ? Smokeless tobacco: Never  ? Tobacco comments:  ?  recreational   ?Vaping Use  ? Vaping Use: Never used  ?Substance Use Topics  ? Alcohol use: Yes  ?  Comment: rarely  ? Drug use: Never  ? ? ?ROS ? ? ?Objective:  ? ?Vitals: ?BP 126/73 (BP Location: Right Arm)   Pulse 78   Temp 98.5 ?  F (36.9 ?C) (Oral)   Resp 18   SpO2 95%  ? ?Physical Exam ?Constitutional:   ?   General: He is not in acute distress. ?   Appearance: Normal appearance. He is well-developed and normal weight. He is not ill-appearing, toxic-appearing or diaphoretic.  ?HENT:  ?   Head: Normocephalic and atraumatic.  ?   Right Ear: External ear normal.  ?   Left Ear: External ear normal.  ?   Nose: Nose normal.  ?   Mouth/Throat:  ?   Pharynx: Oropharynx is clear.  ?Eyes:  ?   General: No scleral icterus.    ?   Right eye: No discharge.     ?   Left eye: No discharge.  ?   Extraocular Movements: Extraocular movements intact.  ?Cardiovascular:  ?   Rate and Rhythm: Normal rate.  ?Pulmonary:  ?   Effort: Pulmonary effort is normal.  ?Musculoskeletal:  ?   Cervical  back: Normal range of motion.  ?   Right upper leg: Tenderness (over area outlined) present. No swelling, edema, deformity, lacerations or bony tenderness.  ?     Legs: ? ?Neurological:  ?   Mental Status: He is alert and oriented to person, place, and time.  ?Psychiatric:     ?   Mood and Affect: Mood normal.     ?   Behavior: Behavior normal.     ?   Thought Content: Thought content normal.     ?   Judgment: Judgment normal.  ? ? ?Assessment and Plan :  ? ?PDMP not reviewed this encounter. ? ?1. Hamstring strain, left, initial encounter   ?2. Left leg pain   ? ?Recommended rice method, meloxicam for pain and inflammation.  Advised that he follow-up with Dr. Aline Brochure for further evaluation, consideration for imaging or physical therapy. Counseled patient on potential for adverse effects with medications prescribed/recommended today, ER and return-to-clinic precautions discussed, patient verbalized understanding. ? ?  ?Jaynee Eagles, PA-C ?08/27/21 1402 ? ?

## 2021-08-27 NOTE — ED Triage Notes (Signed)
Felt something pull in left upper leg yesterday while playing basketball. ?

## 2021-10-16 ENCOUNTER — Other Ambulatory Visit: Payer: Self-pay | Admitting: Physician Assistant

## 2021-10-16 NOTE — Telephone Encounter (Signed)
Next Visit: 12/26/2021  Last Visit: 07/27/2021  Last Fill: 07/27/2021  [id number] arthritis involving multiple sites with positive rheumatoid factor   Current Dose per office note 07/27/2021: Morrie Sheldon XR 11 mg daily  Labs: 07/27/2021 CBC and CMP WNL  TB Gold: 07/27/2021 Neg    Okay to refill Morrie Sheldon?

## 2021-10-20 ENCOUNTER — Telehealth: Payer: Self-pay | Admitting: Pharmacist

## 2021-10-20 NOTE — Telephone Encounter (Signed)
Received notification from [facility] regarding a prior authorization for [facility]. Authorization has been APPROVED from 10/20/21 to 10/21/22.   Patient must continue to fill through [facility]: [phone]   Authorization # [id number]  Chesley Mires, PharmD, MPH, BCPS, CPP Clinical Pharmacist (Rheumatology and Pulmonology)

## 2021-10-20 NOTE — Telephone Encounter (Signed)
[facility] Bobby OharaX[facility]anz is due for authorization renewal. Submitted prior authorization RENEWAL request via CMM to [facility].  Key: [id number]  Chesley Miresevki Lynette Topete, PharmD, MPH, BCPS, CPP Clinical Pharmacist (Rheumatology and Pulmonology)

## 2021-11-10 ENCOUNTER — Telehealth: Payer: Self-pay | Admitting: *Deleted

## 2021-11-10 DIAGNOSIS — Z79899 Other long term (current) drug therapy: Secondary | ICD-10-CM

## 2021-11-10 NOTE — Telephone Encounter (Signed)
Labs received from:[facility]  Drawn on:1970-06-28  Reviewed by:Sherron Ales, PA-C  Labs drawn:CBC, CMP, Lipid Panel  Results: Glucose 136    Chloride 107    WBC 3.3    Triglycerides 156  Patient on Xeljanz XR 11 mg on tab po daily.   Per Ladona Ridgel recheck CBC with diff in 1 month.    Attempted to contact the patient and left message for patient to call the office.

## 2021-11-13 ENCOUNTER — Telehealth: Payer: Self-pay | Admitting: Rheumatology

## 2021-11-13 NOTE — Telephone Encounter (Signed)
I called patient, patient will have CBC w/diff repeated at work with nurse and fax results to [place] in 1 month, patient has f/u appt with nurse at work 11/14/2021 to discuss lab results.

## 2021-11-13 NOTE — Telephone Encounter (Signed)
Patient called the office stating he had missed a call regarding labs last week and requests a call back.

## 2021-12-12 NOTE — Progress Notes (Unsigned)
Office Visit Note  Patient: Bobby Kramer             Date of Birth: 1971/01/10           MRN: [id number]             PCP: Jake Samples, PA-C Referring: Jake Samples, [place]* Visit Date: 12/26/2021 Occupation: @GUAROCC @  Subjective:  Left knee joint pain   History of Present Illness: Bobby Kramer is a 51 y.o. male with history of seropositive rheumatoid arthritis. He is taking xeljanz 11 mg XR daily.  He is tolerating Morrie Sheldon without any side effects and has not missed any doses recently.  He states that about 6 to 8 weeks ago he started to experience some pain in his right knee after a lower extremity weight lifting exercise.  He states that the right knee joint pain was self resolving.  He states that about a week or 2 later he started to have increased pain in the left knee joint at the end of June.  He has been avoiding lower extremity muscle strengthening at the gym to try to allow his left knee to rest and recuperate.  He denies any mechanical symptoms in the left knee.  He states that his symptoms have been intermittent and are exacerbated by climbing steps.  He denies any warmth or swelling in the left knee joint.  He has not been taking any over-the-counter products for pain relief.  He denies any other joint pain or joint swelling at this time.  He denies any recent infections.     Activities of Daily Living:  Patient reports morning stiffness for 5 minutes.   Patient Denies nocturnal pain.  Difficulty dressing/grooming: Denies Difficulty climbing stairs: Denies Difficulty getting out of chair: Denies Difficulty using hands for taps, buttons, cutlery, and/or writing: Denies  Review of Systems  Constitutional:  Negative for fatigue.  HENT:  Negative for mouth sores and mouth dryness.   Eyes:  Positive for dryness.  Respiratory:  Negative for shortness of breath.   Cardiovascular:  Negative for chest pain and palpitations.  Gastrointestinal:  Negative for blood in  stool, constipation and diarrhea.  Endocrine: Negative for increased urination.  Genitourinary:  Negative for involuntary urination.  Musculoskeletal:  Positive for morning stiffness. Negative for joint pain, gait problem, joint pain, joint swelling, myalgias, muscle weakness, muscle tenderness and myalgias.  Skin:  Positive for sensitivity to sunlight. Negative for color change, rash and hair loss.  Allergic/Immunologic: Negative for susceptible to infections.  Neurological:  Negative for dizziness and headaches.  Hematological:  Negative for swollen glands.  Psychiatric/Behavioral:  Negative for depressed mood and sleep disturbance. The patient is not nervous/anxious.     PMFS History:  Patient Active Problem List   Diagnosis Date Noted   Special screening for malignant neoplasms, colon    Pain in right hip 06/20/2016   High risk medication use 05/31/2016   History of hypertension 05/31/2016   History of sleep apnea 05/31/2016   HLA B27 (HLA B27 positive) 05/31/2016   Trochanteric bursitis of both hips 05/31/2016   OSA (obstructive sleep apnea) 01/08/2016   Overweight 01/08/2016   Hyperlipidemia 01/08/2016   Rheumatoid arthritis ([place]) 01/08/2016   Cervicalgia 11/13/2013   Sprain of neck 11/13/2013   HTN (hypertension) 07/28/2013   Mild obesity 07/28/2013    Past Medical History:  Diagnosis Date   Hypertension    Rheumatoid arthritis ([place])    Seasonal allergies    Sleep  apnea    uses a cpap    Family History  Problem Relation Age of Onset   Ovarian cancer Mother    Cancer Mother        colon    Diabetes Father    Heart disease Father    Healthy Sister    Healthy Daughter    Healthy Daughter    Past Surgical History:  Procedure Laterality Date   CARDIAC CATHETERIZATION  05/09/2012   EF 55% Normal Coronary Arteries   COLONOSCOPY N/A 04/04/2018   Procedure: COLONOSCOPY;  Surgeon: Danie Binder, MD;  [facility];  Service: Endoscopy;  Laterality: N/A;   1:00 - pt knows to arrive at 10:30   DOPPLER ECHOCARDIOGRAPHY  09/18/2011   EF>55%   EXCISION MORTON'S NEUROMA  03/06/2012   Procedure: EXCISION MORTON'S NEUROMA;  Surgeon: Wylene Simmer, MD;  Location: [facility];  Service: Orthopedics;  Laterality: Right;  EXCISION OF RIGHT THIRD WEB SPACE NEUROMA  HENDER RETRACTOR *[place] USED AS TOURNIQUET.  ON AT 0881.  OFF AT 0826.   GXT  09/18/2011   LEFT HEART CATHETERIZATION WITH CORONARY ANGIOGRAM Bilateral 05/26/2012   Procedure: LEFT HEART CATHETERIZATION WITH CORONARY ANGIOGRAM;  Surgeon: Sanda Klein, MD;  Location: [facility] CATH [facility];  Service: Cardiovascular;  Laterality: Bilateral;   SYNOVECTOMY  03/06/2012   Procedure: SYNOVECTOMY;  Surgeon: Wylene Simmer, MD;  Location: [facility];  Service: Orthopedics;  Laterality: Right;  SYNOVECTOMY OF THIRD AND FOURTH METATARSAL PHALANGEAL JOINT   WISDOM TOOTH EXTRACTION     Social History   Social History Narrative   Not on file   Immunization History  Administered Date(s) Administered   Influenza-Unspecified 02/26/2015, 03/28/2018   Janssen (J&J) SARS-COV-2 Vaccination 11/06/2019   Moderna Sars-Covid-2 Vaccination 04/27/2020     Objective: Vital Signs: BP (!) 161/84 (BP Location: Left Arm, Patient Position: Sitting, Cuff Size: Normal)   Pulse 64   Ht $R'6\' 2"'NB$  (1.88 m)   Wt 247 lb 3.2 oz (112.1 kg)   BMI 31.74 kg/m    Physical Exam Vitals and nursing note reviewed.  Constitutional:      Appearance: He is well-developed.  HENT:     Head: Normocephalic and atraumatic.  Eyes:     Conjunctiva/sclera: Conjunctivae normal.     Pupils: Pupils are equal, round, and reactive to light.  Cardiovascular:     Rate and Rhythm: Normal rate and regular rhythm.     Heart sounds: Normal heart sounds.  Pulmonary:     Effort: Pulmonary effort is normal.     Breath sounds: Normal breath sounds.  Abdominal:     General: Bowel sounds are normal.     Palpations: Abdomen is soft.   Musculoskeletal:     Cervical back: Normal range of motion and neck supple.  Skin:    General: Skin is warm and dry.     Capillary Refill: Capillary refill takes less than 2 seconds.  Neurological:     Mental Status: He is alert and oriented to person, place, and time.  Psychiatric:        Behavior: Behavior normal.      Musculoskeletal Exam: C-spine, thoracic spine, lumbar spine have good range of motion.  No midline spinal tenderness.  Shoulder joints, elbow joints, wrist joints, MCPs, PIPs, DIPs have good range of motion with no synovitis.  Complete fist formation bilaterally.  Hip joints have good range of motion with no groin pain.  Good range of motion of both knee  joints with some discomfort and crepitus in the left knee.  No warmth or effusion in the left knee joint joint noted. Ankle joints have good ROM with no tenderness.   CDAI Exam: CDAI Score: -- Patient Global: --; Provider Global: -- Swollen: --; Tender: -- Joint Exam 12/26/2021   No joint exam has been documented for this visit   There is currently no information documented on the homunculus. Go to the Rheumatology activity and complete the homunculus joint exam.  Investigation: No additional findings.  Imaging: No results found.  Recent Labs: Lab Results  Component Value Date   WBC 4.1 07/27/2021   HGB 13.9 07/27/2021   PLT 209 07/27/2021   NA 140 07/27/2021   K 4.4 07/27/2021   CL 106 07/27/2021   CO2 27 07/27/2021   GLUCOSE 99 07/27/2021   BUN 14 07/27/2021   CREATININE 1.16 07/27/2021   BILITOT 0.5 07/27/2021   ALKPHOS 54 12/11/2016   AST 26 07/27/2021   ALT 27 07/27/2021   PROT 6.5 07/27/2021   ALBUMIN 4.7 12/11/2016   CALCIUM 9.4 07/27/2021   GFRAA >89 12/11/2016   QFTBGOLDPLUS NEGATIVE 07/27/2021    Speciality Comments: Last Labs: 01/12/2020  Procedures:  No procedures performed Allergies: Patient has no known allergies.   Assessment / Plan:     Visit Diagnoses: Rheumatoid  arthritis involving multiple sites with positive rheumatoid factor [facility]): He has no joint tenderness or synovitis on examination today.  He has not had any signs or symptoms of a rheumatoid arthritis flare.  He has clinically been doing well taking Xeljanz 11 mg XR daily.  He continues to tolerate Morrie Sheldon without any side effects and has not missed any doses recently.  He presents today with increased discomfort in the left knee joint but has no warmth or effusion on examination. Left knee x-rays were ordered today. He was encouraged to use Voltaren gel as well as perform lower extremity muscle strengthening exercises.  He is not experiencing other joint pain or inflammation at this time.  He will remain on Xeljanz as prescribed.  He was advised to notify us if he develops increased joint pain or joint swelling.  He will follow-up in the office in 5 months or sooner if needed.  High risk medication use - Xeljanz XR 11 mg daily. CBC and CMP updated on 12/14/21.  White blood cell count was low.  He plans on returning for updated lab work in 1 month. TB gold negative on 07/27/21.  He has not had any recent or recurrent infections.  Discussed the importance of holding xeljanz if he develops signs or symptoms of an infection and to resume once the infection has completely cleared.  Counseled on the increase risk of venous thrombosis. Counseled about FDA black box warning of MACE (major adverse CV events including cardiovascular death, myocardial infarction, and stroke).  Reviewed with patient that there is the possibility of an increased risk of malignancy specifically lung cancer and lymphomas but it is not well understood if this increased risk is due to the medication or the disease state.   HLA B27 (HLA B27 positive)  Plantar fasciitis, left - Resolved.  Trochanteric bursitis of both hips - Resolved.  Chronic pain of left knee - He presents today with left knee joint pain which started at the end of June.   He thinks he may have tweaked his left knee while performing weightlifting exercises including squatting.  No injury or fall prior to the onset of symptoms.  He is not currently experiencing mechanical symptoms.  He has not noticed any warmth or swelling in the left knee.  He has not been taking any over-the-counter products for pain relief since his symptoms have been intermittent.  His discomfort is exacerbated by climbing steps.  On examination he has good range of motion of the left knee joint with mild crepitus.  No warmth or effusion was noted.  No instability was noted.  No Baker's cyst was palpable. X-rays of the left knee will be obtained today for further evaluation.  Different treatment options were discussed.  He would like to hold off on a cortisone injection at this time.  Discussed the use of Voltaren gel which she can apply topically up to 4 times daily for pain relief.  He was also given knee joint exercises for lower extremity muscle strengthening. Plan: XR KNEE 3 VIEW LEFT  Other medical conditions are listed as follows:   OSA (obstructive sleep apnea)  History of hypertension  Mixed hyperlipidemia    Orders: Orders Placed This Encounter  Procedures   XR KNEE 3 VIEW LEFT   No orders of the defined types were placed in this encounter.   Follow-Up Instructions: Return in about 5 months (around 05/28/2022) for Rheumatoid arthritis.   Ofilia Neas, PA-C  Note - This record has been created using Dragon software.  Chart creation errors have been sought, but may not always  have been located. Such creation errors do not reflect on  the standard of medical care.

## 2021-12-20 ENCOUNTER — Telehealth: Payer: Self-pay | Admitting: *Deleted

## 2021-12-20 NOTE — Telephone Encounter (Signed)
Labs received from:Dr Clovis Riley  Drawn on:12/14/2021  Reviewed by:Sr. Shaili Deveshwar   Labs drawn:CBC, CMP  Results:A/G Ratio 2.3  WBC 2.9  Patient on Xeljanz 11 mg one tablet by mouth daily.   Per Dr. Corliss Skains, repeat CBC with diff in 1 month.  Attempted to contact the patient and left message for patient to call the office.

## 2021-12-21 NOTE — Telephone Encounter (Signed)
Patient returned call to the office and advised he will need to repeat CBC with diff in 1 month. Patient states he will get that scheduled.

## 2021-12-21 NOTE — Telephone Encounter (Signed)
Attempted to contact the patient and left message for patient to call the office.  

## 2021-12-26 ENCOUNTER — Ambulatory Visit (INDEPENDENT_AMBULATORY_CARE_PROVIDER_SITE_OTHER): Payer: BC Managed Care – PPO

## 2021-12-26 ENCOUNTER — Ambulatory Visit: Payer: BC Managed Care – PPO | Attending: Rheumatology | Admitting: Physician Assistant

## 2021-12-26 ENCOUNTER — Encounter: Payer: Self-pay | Admitting: Physician Assistant

## 2021-12-26 VITALS — BP 161/84 | HR 64 | Ht 74.0 in | Wt 247.2 lb

## 2021-12-26 DIAGNOSIS — E782 Mixed hyperlipidemia: Secondary | ICD-10-CM

## 2021-12-26 DIAGNOSIS — M722 Plantar fascial fibromatosis: Secondary | ICD-10-CM | POA: Diagnosis not present

## 2021-12-26 DIAGNOSIS — M25562 Pain in left knee: Secondary | ICD-10-CM | POA: Diagnosis not present

## 2021-12-26 DIAGNOSIS — M0579 Rheumatoid arthritis with rheumatoid factor of multiple sites without organ or systems involvement: Secondary | ICD-10-CM

## 2021-12-26 DIAGNOSIS — M7062 Trochanteric bursitis, left hip: Secondary | ICD-10-CM

## 2021-12-26 DIAGNOSIS — G8929 Other chronic pain: Secondary | ICD-10-CM

## 2021-12-26 DIAGNOSIS — Z1589 Genetic susceptibility to other disease: Secondary | ICD-10-CM | POA: Diagnosis not present

## 2021-12-26 DIAGNOSIS — M7061 Trochanteric bursitis, right hip: Secondary | ICD-10-CM

## 2021-12-26 DIAGNOSIS — Z79899 Other long term (current) drug therapy: Secondary | ICD-10-CM

## 2021-12-26 DIAGNOSIS — Z8679 Personal history of other diseases of the circulatory system: Secondary | ICD-10-CM

## 2021-12-26 DIAGNOSIS — G4733 Obstructive sleep apnea (adult) (pediatric): Secondary | ICD-10-CM

## 2021-12-26 NOTE — Progress Notes (Signed)
X-rays of the left were consistent with mild OA and mild chondromalacia patella. Please notify the patient.

## 2021-12-26 NOTE — Patient Instructions (Addendum)
Standing Labs We placed an order today for your standing lab work.   Please have your standing labs drawn in 1 month and every 3 months   If possible, please have your labs drawn 2 weeks prior to your appointment so that the provider can discuss your results at your appointment.  Please note that you may see your imaging and lab results in MyChart before we have reviewed them. We may be awaiting multiple results to interpret others before contacting you. Please allow our office up to 72 hours to thoroughly review all of the results before contacting the office for clarification of your results.  We have open lab daily: Monday through Thursday from 1:30-4:30 PM and Friday from 1:30-4:00 PM at the office of Dr. Pollyann Savoy, [facility].   Please be advised, all patients with office appointments requiring lab work will take precedent over walk-in lab work.  If possible, please come for your lab work on Monday and Friday afternoons, as you may experience shorter wait times. The office is located at [address], [address], [place], [place] [address] No appointment is necessary.   Labs are drawn by [facility]. Please bring your co-pay at the time of your lab draw.  You may receive a bill from [facility] for your lab work.  Please note if you are on Hydroxychloroquine and and an order has been placed for a Hydroxychloroquine level, you will need to have it drawn 4 hours or more after your last dose.  If you wish to have your labs drawn at another location, please call the office 24 hours in advance to send orders.  If you have any questions regarding directions or hours of operation,  please call [phone].   As a reminder, please drink plenty of water prior to coming for your lab work. Thanks!   Knee Exercises Ask your health care provider which exercises are safe for you. Do exercises exactly as told by your health care provider and adjust them as directed. It is normal to feel  mild stretching, pulling, tightness, or discomfort as you do these exercises. Stop right away if you feel sudden pain or your pain gets worse. Do not begin these exercises until told by your health care provider. Stretching and range-of-motion exercises These exercises warm up your muscles and joints and improve the movement and flexibility of your knee. These exercises also help to relieve pain and swelling. Knee extension, prone  Lie on your abdomen (prone position) on a bed. Place your left / right knee just beyond the edge of the surface so your knee is not on the bed. You can put a towel under your left / right thigh just above your kneecap for comfort. Relax your leg muscles and allow gravity to straighten your knee (extension). You should feel a stretch behind your left / right knee. Hold this position for __________ seconds. Scoot up so your knee is supported between repetitions. Repeat __________ times. Complete this exercise __________ times a day. Knee flexion, active  Lie on your back with both legs straight. If this causes back discomfort, bend your left / right knee so your foot is flat on the floor. Slowly slide your left / right heel back toward your buttocks. Stop when you feel a gentle stretch in the front of your knee or thigh (flexion). Hold this position for __________ seconds. Slowly slide your left / right heel back to the starting position. Repeat __________ times. Complete this exercise __________ times a  day. Quadriceps stretch, prone  Lie on your abdomen on a firm surface, such as a bed or padded floor. Bend your left / right knee and hold your ankle. If you cannot reach your ankle or pant leg, loop a belt around your foot and grab the belt instead. Gently pull your heel toward your buttocks. Your knee should not slide out to the side. You should feel a stretch in the front of your thigh and knee (quadriceps). Hold this position for __________ seconds. Repeat  __________ times. Complete this exercise __________ times a day. Hamstring, supine  Lie on your back (supine position). Loop a belt or towel over the ball of your left / right foot. The ball of your foot is on the walking surface, right under your toes. Straighten your left / right knee and slowly pull on the belt to raise your leg until you feel a gentle stretch behind your knee (hamstring). Do not let your knee bend while you do this. Keep your other leg flat on the floor. Hold this position for __________ seconds. Repeat __________ times. Complete this exercise __________ times a day. Strengthening exercises These exercises build strength and endurance in your knee. Endurance is the ability to use your muscles for a long time, even after they get tired. Quadriceps, isometric This exercise strengthens the muscles in front of your thigh (quadriceps) without moving your knee joint (isometric). Lie on your back with your left / right leg extended and your other knee bent. Put a rolled towel or small pillow under your knee if told by your health care provider. Slowly tense the muscles in the front of your left / right thigh. You should see your kneecap slide up toward your hip or see increased dimpling just above the knee. This motion will push the back of the knee toward the floor. For __________ seconds, hold the muscle as tight as you can without increasing your pain. Relax the muscles slowly and completely. Repeat __________ times. Complete this exercise __________ times a day. Straight leg raises This exercise strengthens the muscles in front of your thigh (quadriceps) and the muscles that move your hips (hip flexors). Lie on your back with your left / right leg extended and your other knee bent. Tense the muscles in the front of your left / right thigh. You should see your kneecap slide up or see increased dimpling just above the knee. Your thigh may even shake a bit. Keep these muscles  tight as you raise your leg 4-6 inches (10-15 cm) off the floor. Do not let your knee bend. Hold this position for __________ seconds. Keep these muscles tense as you lower your leg. Relax your muscles slowly and completely after each repetition. Repeat __________ times. Complete this exercise __________ times a day. Hamstring, isometric  Lie on your back on a firm surface. Bend your left / right knee about __________ degrees. Dig your left / right heel into the surface as if you are trying to pull it toward your buttocks. Tighten the muscles in the back of your thighs (hamstring) to "dig" as hard as you can without increasing any pain. Hold this position for __________ seconds. Release the tension gradually and allow your muscles to relax completely for __________ seconds after each repetition. Repeat __________ times. Complete this exercise __________ times a day. Hamstring curls If told by your health care provider, do this exercise while wearing ankle weights. Begin with __________lb / kg weights. Then increase the weight by 1  lb (0.5 kg) increments. Do not wear ankle weights that are more than __________lb / kg. Lie on your abdomen with your legs straight. Bend your left / right knee as far as you can without feeling pain. Keep your hips flat against the floor. Hold this position for __________ seconds. Slowly lower your leg to the starting position. Repeat __________ times. Complete this exercise __________ times a day. Squats This exercise strengthens the muscles in front of your thigh and knee (quadriceps). Stand in front of a table, with your feet and knees pointing straight ahead. You may rest your hands on the table for balance but not for support. Slowly bend your knees and lower your hips like you are going to sit in a chair. Keep your weight over your heels, not over your toes. Keep your lower legs upright so they are parallel with the table legs. Do not let your hips go lower  than your knees. Do not bend lower than told by your health care provider. If your knee pain increases, do not bend as low. Hold the squat position for __________ seconds. Slowly push with your legs to return to standing. Do not use your hands to pull yourself to standing. Repeat __________ times. Complete this exercise __________ times a day. Wall slides This exercise strengthens the muscles in front of your thigh and knee (quadriceps). Lean your back against a smooth wall or door, and walk your feet out 18-24 inches (46-61 cm) from it. Place your feet hip-width apart. Slowly slide down the wall or door until your knees bend __________ degrees. Keep your knees over your heels, not over your toes. Keep your knees in line with your hips. Hold this position for __________ seconds. Repeat __________ times. Complete this exercise __________ times a day. Straight leg raises, side-lying This exercise strengthens the muscles that rotate the leg at the hip and move it away from your body (hip abductors). Lie on your side with your left / right leg in the top position. Lie so your head, shoulder, knee, and hip line up. You may bend your bottom knee to help you keep your balance. Roll your hips slightly forward so your hips are stacked directly over each other and your left / right knee is facing forward. Leading with your heel, lift your top leg 4-6 inches (10-15 cm). You should feel the muscles in your outer hip lifting. Do not let your foot drift forward. Do not let your knee roll toward the ceiling. Hold this position for __________ seconds. Slowly return your leg to the starting position. Let your muscles relax completely after each repetition. Repeat __________ times. Complete this exercise __________ times a day. Straight leg raises, prone This exercise stretches the muscles that move your hips away from the front of the pelvis (hip extensors). Lie on your abdomen on a firm surface. You can  put a pillow under your hips if that is more comfortable. Tense the muscles in your buttocks and lift your left / right leg about 4-6 inches (10-15 cm). Keep your knee straight as you lift your leg. Hold this position for __________ seconds. Slowly lower your leg to the starting position. Let your leg relax completely after each repetition. Repeat __________ times. Complete this exercise __________ times a day. This information is not intended to replace advice given to you by your health care provider. Make sure you discuss any questions you have with your health care provider. Document Revised: 01/24/2021 Document Reviewed: 01/24/2021 Elsevier Patient  Education  2023 [employer]  

## 2022-01-09 ENCOUNTER — Other Ambulatory Visit: Payer: Self-pay | Admitting: Physician Assistant

## 2022-01-09 NOTE — Telephone Encounter (Signed)
Next Visit: 06/01/2022  Last Visit: 12/26/2021  Last Fill: 10/16/2021  DX: Rheumatoid arthritis involving multiple sites with positive rheumatoid factor   Current Dose per office note 12/26/2021: Harriette Ohara XR 11 mg daily  Labs: 12/14/2021 A/G Ratio 2.3 WBC 2.9  TB Gold: 07/27/2021 Neg    Okay to refill Harriette Ohara?

## 2022-01-24 ENCOUNTER — Telehealth: Payer: Self-pay | Admitting: *Deleted

## 2022-01-24 NOTE — Telephone Encounter (Signed)
Labs received from: [facility]  Drawn on:01/16/2022  Reviewed by: Sherron Ales, PA-C  Labs drawn:CBC w/Diff  Results:Normal

## 2022-03-12 ENCOUNTER — Encounter: Payer: Self-pay | Admitting: Physician Assistant

## 2022-03-12 ENCOUNTER — Ambulatory Visit: Payer: BC Managed Care – PPO | Attending: Physician Assistant | Admitting: Physician Assistant

## 2022-03-12 VITALS — BP 144/79 | HR 80 | Resp 17 | Ht 74.0 in | Wt 249.4 lb

## 2022-03-12 DIAGNOSIS — M238X1 Other internal derangements of right knee: Secondary | ICD-10-CM

## 2022-03-12 DIAGNOSIS — G4733 Obstructive sleep apnea (adult) (pediatric): Secondary | ICD-10-CM

## 2022-03-12 DIAGNOSIS — M722 Plantar fascial fibromatosis: Secondary | ICD-10-CM

## 2022-03-12 DIAGNOSIS — M0579 Rheumatoid arthritis with rheumatoid factor of multiple sites without organ or systems involvement: Secondary | ICD-10-CM

## 2022-03-12 DIAGNOSIS — Z79899 Other long term (current) drug therapy: Secondary | ICD-10-CM | POA: Diagnosis not present

## 2022-03-12 DIAGNOSIS — Z1589 Genetic susceptibility to other disease: Secondary | ICD-10-CM | POA: Diagnosis not present

## 2022-03-12 DIAGNOSIS — M7062 Trochanteric bursitis, left hip: Secondary | ICD-10-CM

## 2022-03-12 DIAGNOSIS — Z8679 Personal history of other diseases of the circulatory system: Secondary | ICD-10-CM

## 2022-03-12 DIAGNOSIS — E782 Mixed hyperlipidemia: Secondary | ICD-10-CM

## 2022-03-12 DIAGNOSIS — M7061 Trochanteric bursitis, right hip: Secondary | ICD-10-CM

## 2022-03-12 DIAGNOSIS — M238X2 Other internal derangements of left knee: Secondary | ICD-10-CM

## 2022-03-12 DIAGNOSIS — M25511 Pain in right shoulder: Secondary | ICD-10-CM | POA: Diagnosis not present

## 2022-03-12 DIAGNOSIS — M25512 Pain in left shoulder: Secondary | ICD-10-CM

## 2022-03-12 MED ORDER — PREDNISONE 5 MG PO TABS: ORAL_TABLET | ORAL | 0 refills | Status: DC

## 2022-03-12 NOTE — Progress Notes (Signed)
Office Visit Note  Patient: Bobby Kramer             Date of Birth: 1970-11-06           MRN: [id number]             PCP: Jake Samples, PA-C Referring: Jake Samples, PA* Visit Date: 03/12/2022 Occupation: _0 @  Subjective:  Pain in both shoulders  History of Present Illness: Bobby Kramer is a 51 y.o. male with history of seropositive rheumatoid arthritis.  He is taking Xeljanz 11 mg XR daily.  Patient continues to tolerate Morrie Sheldon without any side effects.  He has not missed any doses recently.  He denies any recent or recurrent infections.  He is planning on getting his annual flu shot.  Patient reports for the past 4 to 6 weeks he has been experiencing increased pain in both shoulder joints.  He denies any injury prior to the onset of symptoms.  He states that he was previously weightlifting 3 days a week but over the past 10 to 14 days he has stopped to see if his shoulder pain would improve.  He denies any excessive overhead activities or lifting anything heavy recently.  He states that his pain is most severe at night.  He has been waking up around 3 AM with right shoulder pain to the point that he has to reposition and has difficulty falling back to sleep.  He states that the pain has been slightly worse than a nagging discomfort.  He states he has also had some increased discomfort in both knee joints.  He denies any joint swelling at this time.    Activities of Daily Living:  Patient reports morning stiffness for 0 minutes.   Patient Reports nocturnal pain.  Difficulty dressing/grooming: Denies Difficulty climbing stairs: Denies Difficulty getting out of chair: Denies Difficulty using hands for taps, buttons, cutlery, and/or writing: Denies  Review of Systems  Constitutional:  Negative for fatigue.  HENT:  Negative for mouth sores and mouth dryness.   Eyes:  Negative for dryness.  Respiratory:  Negative for shortness of breath.   Cardiovascular:  Negative  for chest pain and palpitations.  Gastrointestinal:  Negative for blood in stool, constipation and diarrhea.  Endocrine: Negative for increased urination.  Genitourinary:  Negative for involuntary urination.  Musculoskeletal:  Positive for joint pain and joint pain. Negative for gait problem, joint swelling, myalgias, muscle weakness, morning stiffness, muscle tenderness and myalgias.  Skin:  Negative for color change, rash and sensitivity to sunlight.  Allergic/Immunologic: Negative for susceptible to infections.  Neurological:  Negative for dizziness and headaches.  Hematological:  Negative for swollen glands.  Psychiatric/Behavioral:  Positive for sleep disturbance. Negative for depressed mood. The patient is not nervous/anxious.     PMFS History:  Patient Active Problem List   Diagnosis Date Noted   Special screening for malignant neoplasms, colon    Pain in right hip 06/20/2016   High risk medication use 05/31/2016   History of hypertension 05/31/2016   History of sleep apnea 05/31/2016   HLA B27 (HLA B27 positive) 05/31/2016   Trochanteric bursitis of both hips 05/31/2016   OSA (obstructive sleep apnea) 01/08/2016   Overweight 01/08/2016   Hyperlipidemia 01/08/2016   Rheumatoid arthritis ([place]) 01/08/2016   Cervicalgia 11/13/2013   Sprain of neck 11/13/2013   HTN (hypertension) 07/28/2013   Mild obesity 07/28/2013    Past Medical History:  Diagnosis Date   Hypertension    Rheumatoid  arthritis ([place])    Seasonal allergies    Sleep apnea    uses a cpap    Family History  Problem Relation Age of Onset   Ovarian cancer Mother    Cancer Mother        colon    Diabetes Father    Heart disease Father    Healthy Sister    Healthy Daughter    Healthy Daughter    Past Surgical History:  Procedure Laterality Date   CARDIAC CATHETERIZATION  05/09/2012   EF 55% Normal Coronary Arteries   COLONOSCOPY N/A 04/04/2018   Procedure: COLONOSCOPY;  Surgeon: Danie Binder, MD;   [facility];  Service: Endoscopy;  Laterality: N/A;  1:00 - pt knows to arrive at 10:30   DOPPLER ECHOCARDIOGRAPHY  09/18/2011   EF>55%   EXCISION MORTON'S NEUROMA  03/06/2012   Procedure: EXCISION MORTON'S NEUROMA;  Surgeon: Wylene Simmer, MD;  Location: [facility];  Service: Orthopedics;  Laterality: Right;  EXCISION OF RIGHT THIRD WEB SPACE NEUROMA  HENDER RETRACTOR *[place]ng USED AS TOURNIQUET.  ON AT 6269.  OFF AT 0826.   GXT  09/18/2011   LEFT HEART CATHETERIZATION WITH CORONARY ANGIOGRAM Bilateral 05/26/2012   Procedure: LEFT HEART CATHETERIZATION WITH CORONARY ANGIOGRAM;  Surgeon: Sanda Klein, MD;  Location: [facility];  Service: Cardiovascular;  Laterality: Bilateral;   SYNOVECTOMY  03/06/2012   Procedure: SYNOVECTOMY;  Surgeon: Wylene Simmer, MD;  Location: [facility];  Service: Orthopedics;  Laterality: Right;  SYNOVECTOMY OF THIRD AND FOURTH METATARSAL PHALANGEAL JOINT   WISDOM TOOTH EXTRACTION     Social History   Social History Narrative   Not on file   Immunization History  Administered Date(s) Administered   Influenza-Unspecified 02/26/2015, 03/28/2018   Janssen (J&J) SARS-COV-2 Vaccination 11/06/2019   Moderna Sars-Covid-2 Vaccination 04/27/2020     Objective: Vital Signs: BP (!) 144/79 (BP Location: Left Arm, Patient Position: Sitting, Cuff Size: Normal)   Pulse 80   Resp 17   Ht _0  (1.88 m)   Wt 249 lb 6.4 oz (113.1 kg)   BMI 32.02 kg/m    Physical Exam Vitals and nursing note reviewed.  Constitutional:      Appearance: He is well-developed.  HENT:     Head: Normocephalic and atraumatic.  Eyes:     Conjunctiva/sclera: Conjunctivae normal.     Pupils: Pupils are equal, round, and reactive to light.  Cardiovascular:     Rate and Rhythm: Normal rate and regular rhythm.     Heart sounds: Normal heart sounds.  Pulmonary:     Effort: Pulmonary effort is normal.     Breath sounds: Normal breath sounds.  Abdominal:      General: Bowel sounds are normal.     Palpations: Abdomen is soft.  Musculoskeletal:     Cervical back: Normal range of motion and neck supple.  Skin:    General: Skin is warm and dry.     Capillary Refill: Capillary refill takes less than 2 seconds.  Neurological:     Mental Status: He is alert and oriented to person, place, and time.  Psychiatric:        Behavior: Behavior normal.      Musculoskeletal Exam: C-spine has good range of motion with no discomfort.  No trapezius muscle tension or tenderness.  Painful range of motion of both shoulder joints especially with internal rotation. Positive Neer's test-right shoulder.  Tenderness over the right subacromial bursa and on posterior  aspect.  Elbow joints have good range of motion with no tenderness or inflammation.  No evidence of epicondylitis noted.  Wrist joints, MCPs, PIPs, DIPs have good range of motion with no synovitis.  PIP and DIP thickening consistent with osteoarthritis of both hands.  Hip joints have good range of motion with no groin pain.  Knee joints have good range of motion with crepitus bilaterally.  Mild warmth of both knees but no effusion noted.  Ankle joints have good range of motion with no tenderness or joint swelling.  CDAI Exam: CDAI Score: 5  Patient Global: 5 mm; Provider Global: 5 mm Swollen: 0 ; Tender: 4  Joint Exam 03/12/2022      Right  Left  Glenohumeral   Tender   Tender  Knee   Tender   Tender     Investigation: No additional findings.  Imaging: No results found.  Recent Labs: Lab Results  Component Value Date   WBC 4.1 07/27/2021   HGB 13.9 07/27/2021   PLT 209 07/27/2021   NA 140 07/27/2021   K 4.4 07/27/2021   CL 106 07/27/2021   CO2 27 07/27/2021   GLUCOSE 99 07/27/2021   BUN 14 07/27/2021   CREATININE 1.16 07/27/2021   BILITOT 0.5 07/27/2021   ALKPHOS 54 12/11/2016   AST 26 07/27/2021   ALT 27 07/27/2021   PROT 6.5 07/27/2021   ALBUMIN 4.7 12/11/2016   CALCIUM 9.4  07/27/2021   GFRAA >89 12/11/2016   QFTBGOLDPLUS NEGATIVE 07/27/2021    Speciality Comments: Last Labs: 01/12/2020  Procedures:  No procedures performed Allergies: Patient has no known allergies.   Assessment / Plan:     Visit Diagnoses: Rheumatoid arthritis involving multiple sites with positive rheumatoid factor ([place]): He has no synovitis on examination today.  He presents today with increased pain in both shoulder joints which started 4 to 6 weeks ago.  He had no injury prior to the onset of symptoms.  He has been experiencing nocturnal pain in both shoulders.  He is also had some increased discomfort and stiffness in both knee joints.  On examination he has bilateral knee crepitus but no warmth or effusion noted.  He has been taking Xeljanz 11 mg XR daily.  He has not had any interruptions in therapy. CRP and sed rate will be checked tomorrow with routine lab work. A prednisone taper starting at 20 mg tapering by 5 mg every 4 days was sent to the pharmacy to start after he has had updated lab work.  He was advised to avoid the use of NSAIDs while taking prednisone.  He will remain on Xeljanz as prescribed.  He was advised to notify us if his symptoms persist or worsen.  Discussed that if he continues to have symptoms of recurrent flares we could try adding Plaquenil as combination therapy in the future.  High risk medication use: Xeljanz XR 11 mg daily.  CBC and CMP drawn on 01/16/2022.  He is planning on have updated lab work tomorrow.  CBC and CMP will be updated. TB gold negative on 07/27/21.  He has not had any recent or recurrent infections.  Discussed the importance of holding xeljanz if he develops signs or symptoms of an infection and to resume once the infection has completely cleared.  He is planning on getting the annual flu shot. Counseled on the increase risk of venous thrombosis. Counseled about FDA black box warning of MACE (major adverse CV events including cardiovascular death,  myocardial infarction, and  stroke).  Reviewed with patient that there is the possibility of an increased risk of malignancy specifically lung cancer and lymphomas but it is not well understood if this increased risk is due to the medication or the disease state.   HLA B27 (HLA B27 positive)  Acute pain of both shoulders: He presents today with increased pain in both shoulder joints for the past 4 to 6 weeks.  No injury prior to the onset of symptoms.  He has not been performing any new or repetitive activities.  He previously was weightlifting 3 days a week but has been avoiding upper extremity strengthening for the past 10 to 14 days to see if his symptoms would improve.  He has tried to perform shoulder joint exercises with no improvement in his symptoms.  He continues to have nocturnal pain when lying on his sides at night.  On examination he has good range of motion with some discomfort bilaterally.  Tenderness over the right subacromial bursa. He is having routine lab work tomorrow so a sed rate and CRP will be added.  A prednisone taper starting at 20 mg tapering by 5 mg every 4 days was sent to the pharmacy.  He was advised to notify us if the symptoms persist or worsen.  Trochanteric bursitis of both hips: Resolved.  Crepitus of both knee joints: He has good range of motion of both knee joints on examination today with crepitus.  No warmth or effusion noted.  He has been experiencing increased discomfort in both knee joints especially after physical activity.   Plantar fasciitis, left: Asymptomatic currently.  Other medical conditions are listed as follows:  OSA (obstructive sleep apnea)  History of hypertension: Blood pressure was 144/79 today in the office.  Mixed hyperlipidemia  Orders: No orders of the defined types were placed in this encounter.  Meds ordered this encounter  Medications   predniSONE (DELTASONE) 5 MG tablet    Sig: Take 4 tablets by mouth daily x4 days, 3  tablets daily x4 days, 2 tablets daily x4 days, 1 tablet daily x4 days.    Dispense:  40 tablet    Refill:  0    Follow-Up Instructions: Return for Rheumatoid arthritis.   Ofilia Neas, PA-C  Note - This record has been created using Dragon software.  Chart creation errors have been sought, but may not always  have been located. Such creation errors do not reflect on  the standard of medical care.

## 2022-03-19 ENCOUNTER — Telehealth: Payer: Self-pay | Admitting: *Deleted

## 2022-03-19 NOTE — Telephone Encounter (Signed)
Labs received from:[facility]  Drawn on: 03/13/2022  Reviewed by:Hazel Sams, PA-C  Labs drawn:CMP, Hgb A1C, ESR, TSH, CRP, PSA, Vitamin D, Vitamin B 12, CBC, Lipid Panel  Results:Glucose 100  Hgb A1C 5.7  Rest of labs WNL  Patient on Xeljanz 11 mg po daily.

## 2022-03-23 ENCOUNTER — Other Ambulatory Visit: Payer: Self-pay | Admitting: Cardiovascular Disease

## 2022-03-28 ENCOUNTER — Other Ambulatory Visit: Payer: Self-pay | Admitting: Physician Assistant

## 2022-03-28 NOTE — Telephone Encounter (Signed)
Next Visit: 06/01/2022  Last Visit: 03/12/2022  Last Fill: 01/09/2022  GO:TLXBWIOMBT arthritis involving multiple sites with positive rheumatoid factor   Current Dose per office note 03/12/2022: Morrie Sheldon XR 11 mg daily.  Labs: 03/13/2022 Glucose 100  Hgb A1C 5.7  Rest of labs WNL  TB Gold: 07/27/2021 Neg    Okay to refill Morrie Sheldon?

## 2022-04-16 ENCOUNTER — Other Ambulatory Visit: Payer: Self-pay | Admitting: Rheumatology

## 2022-04-16 NOTE — Telephone Encounter (Signed)
Patient left a voicemail stating he had an appointment approximately 3-4 weeks  ago because he was experiencing a flair and prescribed Prednisone.  Patient states he responded well.  Patient states since finishing the medication his symptoms have returned and requested a return call.

## 2022-04-16 NOTE — Telephone Encounter (Signed)
Patient called into the office and I spoke with patient. He states he recently had a flare of his shoulders and was given a prednisone taper which improved his symptoms. He states he completed it 1-2 weeks ago. Patient his symptoms have returned. Patient states is is interfering with his sleep. Patient states he is on [place] and has not missed any doses. Patient would like to know if he should do another prednisone or what else he should do. Please advise.

## 2022-04-17 MED ORDER — PREDNISONE 5 MG PO TABS: ORAL_TABLET | ORAL | 0 refills | Status: DC

## 2022-04-17 NOTE — Telephone Encounter (Signed)
Okay to give another prednisone taper starting at 20 mg and taper by 5 mg every 4 days.

## 2022-04-17 NOTE — Telephone Encounter (Signed)
Left message to advise patient we will be sending in a prednisone taper to the pharmacy.

## 2022-05-03 ENCOUNTER — Other Ambulatory Visit: Payer: Self-pay | Admitting: Cardiovascular Disease

## 2022-05-14 ENCOUNTER — Other Ambulatory Visit: Payer: Self-pay | Admitting: Cardiovascular Disease

## 2022-05-18 NOTE — Progress Notes (Signed)
Office Visit Note  Patient: Bobby Kramer             Date of Birth: Jan 20, 1971           MRN: [id number]             PCP: Jake Samples, PA-C Referring: Jake Samples, [place]* Visit Date: 06/01/2022 Occupation: _0 @  Subjective:  Pain in both shoulders  History of Present Illness: NICOLI Kramer is a 51 y.o. male history of seropositive rheumatoid arthritis.  He states he has been experiencing pain in his both shoulders for the last 4 months.  He was here in October 2023 at the time he was given a prednisone taper.  The prednisone taper helped but soon after the pain recurred.  The prednisone taper in December which helped.  He has been experiencing nocturnal pain if he sleeps on his side.  He works out at [employer] on a regular basis.  He tried to scale back on his gym activities but did not notice any improvement.  He has been also experiencing some discomfort in his knee joints but has not noticed any swelling.  Trochanteric bursitis has resolved.  The other joints are painful or swollen.  He has been taking [place] on a regular basis.    Activities of Daily Living:  Patient reports morning stiffness for all day. Patient Reports nocturnal pain.  Difficulty dressing/grooming: Reports Difficulty climbing stairs: Denies Difficulty getting out of chair: Denies Difficulty using hands for taps, buttons, cutlery, and/or writing: Denies  Review of Systems  Constitutional:  Positive for fatigue.  HENT:  Negative for mouth sores and mouth dryness.   Eyes:  Negative for dryness.  Respiratory:  Negative for shortness of breath.   Cardiovascular:  Negative for chest pain and palpitations.  Gastrointestinal:  Negative for blood in stool, constipation and diarrhea.  Endocrine: Negative for increased urination.  Genitourinary:  Negative for involuntary urination.  Musculoskeletal:  Positive for joint pain, joint pain and morning stiffness. Negative for gait problem, joint swelling,  myalgias, muscle weakness, muscle tenderness and myalgias.  Skin:  Negative for color change, rash, hair loss and sensitivity to sunlight.  Allergic/Immunologic: Negative for susceptible to infections.  Neurological:  Negative for dizziness and headaches.  Hematological:  Negative for swollen glands.  Psychiatric/Behavioral:  Positive for sleep disturbance. Negative for depressed mood. The patient is not nervous/anxious.     PMFS History:  Patient Active Problem List   Diagnosis Date Noted   Special screening for malignant neoplasms, colon    Pain in right hip 06/20/2016   High risk medication use 05/31/2016   History of hypertension 05/31/2016   History of sleep apnea 05/31/2016   HLA B27 (HLA B27 positive) 05/31/2016   Trochanteric bursitis of both hips 05/31/2016   OSA (obstructive sleep apnea) 01/08/2016   Overweight 01/08/2016   Hyperlipidemia 01/08/2016   Rheumatoid arthritis ([place]) 01/08/2016   Cervicalgia 11/13/2013   Sprain of neck 11/13/2013   HTN (hypertension) 07/28/2013   Mild obesity 07/28/2013    Past Medical History:  Diagnosis Date   Hypertension    Rheumatoid arthritis ([place])    Seasonal allergies    Sleep apnea    uses a cpap    Family History  Problem Relation Age of Onset   Ovarian cancer Mother    Cancer Mother        colon    Diabetes Father    Heart disease Father    Healthy Sister  Healthy Daughter    Healthy Daughter    Past Surgical History:  Procedure Laterality Date   CARDIAC CATHETERIZATION  05/09/2012   EF 55% Normal Coronary Arteries   COLONOSCOPY N/A 04/04/2018   Procedure: COLONOSCOPY;  Surgeon: Danie Binder, MD;  [facility]: [facility];  Service: Endoscopy;  Laterality: N/A;  1:00 - pt knows to arrive at 10:30   DOPPLER ECHOCARDIOGRAPHY  09/18/2011   EF>55%   EXCISION MORTON'S NEUROMA  03/06/2012   Procedure: EXCISION MORTON'S NEUROMA;  Surgeon: Wylene Simmer, MD;  Location: [facility];  Service: Orthopedics;   Laterality: Right;  EXCISION OF RIGHT THIRD WEB SPACE NEUROMA  HENDER RETRACTOR *[place] USED AS TOURNIQUET.  ON AT 7353.  OFF AT 0826.   GXT  09/18/2011   LEFT HEART CATHETERIZATION WITH CORONARY ANGIOGRAM Bilateral 05/26/2012   Procedure: LEFT HEART CATHETERIZATION WITH CORONARY ANGIOGRAM;  Surgeon: Sanda Klein, MD;  Location: [facility];  Service: Cardiovascular;  Laterality: Bilateral;   SYNOVECTOMY  03/06/2012   Procedure: SYNOVECTOMY;  Surgeon: Wylene Simmer, MD;  Location: [facility];  Service: Orthopedics;  Laterality: Right;  SYNOVECTOMY OF THIRD AND FOURTH METATARSAL PHALANGEAL JOINT   WISDOM TOOTH EXTRACTION     Social History   Social History Narrative   Not on file   Immunization History  Administered Date(s) Administered   Influenza-Unspecified 02/26/2015, 03/28/2018   Janssen (J&J) SARS-COV-2 Vaccination 11/06/2019   Moderna Sars-Covid-2 Vaccination 04/27/2020     Objective: Vital Signs: BP (!) 144/85 (BP Location: Left Arm, Patient Position: Sitting, Cuff Size: Large)   Pulse 70   Resp 18   Ht _0  (1.88 m)   Wt 251 lb 9.6 oz (114.1 kg)   BMI 32.30 kg/m    Physical Exam Vitals and nursing note reviewed.  Constitutional:      Appearance: He is well-developed.  HENT:     Head: Normocephalic and atraumatic.  Eyes:     Conjunctiva/sclera: Conjunctivae normal.     Pupils: Pupils are equal, round, and reactive to light.  Cardiovascular:     Rate and Rhythm: Normal rate and regular rhythm.     Heart sounds: Normal heart sounds.  Pulmonary:     Effort: Pulmonary effort is normal.     Breath sounds: Normal breath sounds.  Abdominal:     General: Bowel sounds are normal.     Palpations: Abdomen is soft.  Musculoskeletal:     Cervical back: Normal range of motion and neck supple.  Skin:    General: Skin is warm and dry.     Capillary Refill: Capillary refill takes less than 2 seconds.  Neurological:     Mental Status: He is alert and  oriented to person, place, and time.  Psychiatric:        Behavior: Behavior normal.      Musculoskeletal Exam: Apical spine was in good range of motion.  There was no tenderness over thoracic or lumbar spine.  He had discomfort with bilateral shoulder joint abduction and some with internal rotation.  No warmth or effusion was noted.  Elbow joints and wrist joints with good range of motion.  There was no synovitis of her wrist joints MCPs PIPs or DIPs.  Hip joints in good range of motion.  He had no tenderness over trochanteric bursa.  He had crepitus in his knee joints without any warmth swelling or effusion.  There was no tenderness over ankles or MTPs.  CDAI Exam: CDAI Score: 3  Patient Global: 5 mm; Provider Global: 5 mm Swollen: 0 ; Tender: 2  Joint Exam 06/01/2022      Right  Left  Glenohumeral   Tender   Tender     Investigation: No additional findings.  Imaging: XR Shoulder Right  Result Date: 06/01/2022 No glenohumeral joint space narrowing was noted.  Mild acromioclavicular narrowing was noted.  No chondrocalcinosis was noted. Impression: These findings are consistent with mild acromioclavicular arthritis.  XR Shoulder Left  Result Date: 06/01/2022 No glenohumeral joint space narrowing was noted.  Mild acromioclavicular narrowing was noted.  No chondrocalcinosis was noted. Impression: These findings are consistent with mild acromioclavicular arthritis.   Recent Labs: Lab Results  Component Value Date   WBC 4.1 07/27/2021   HGB 13.9 07/27/2021   PLT 209 07/27/2021   NA 140 07/27/2021   K 4.4 07/27/2021   CL 106 07/27/2021   CO2 27 07/27/2021   GLUCOSE 99 07/27/2021   BUN 14 07/27/2021   CREATININE 1.16 07/27/2021   BILITOT 0.5 07/27/2021   ALKPHOS 54 12/11/2016   AST 26 07/27/2021   ALT 27 07/27/2021   PROT 6.5 07/27/2021   ALBUMIN 4.7 12/11/2016   CALCIUM 9.4 07/27/2021   GFRAA >89 12/11/2016   QFTBGOLDPLUS NEGATIVE 07/27/2021     Speciality Comments:  Last Labs: 01/12/2020  Procedures:  Large Joint Inj: bilateral subacromial bursa on 06/01/2022 10:37 AM Indications: pain Details: 27 G 1.5 in needle, posterior approach  Arthrogram: No  Medications (Right): 1.5 mL lidocaine 1 %; 40 mg triamcinolone acetonide 40 MG/ML Aspirate (Right): 0 mL Medications (Left): 1.5 mL lidocaine 1 %; 40 mg triamcinolone acetonide 40 MG/ML Aspirate (Left): 0 mL Outcome: tolerated well, no immediate complications Procedure, treatment alternatives, risks and benefits explained, specific risks discussed. Consent was given by the patient. Immediately prior to procedure a time out was called to verify the correct patient, procedure, equipment, support staff and site/side marked as required. Patient was prepped and draped in the usual sterile fashion.     Allergies: Patient has no known allergies.   Assessment / Plan:     Visit Diagnoses: Rheumatoid arthritis involving multiple sites with positive rheumatoid factor (HCC)-he has not had a flare of rheumatoid arthritis.  Rheumatoid arthritis is well-controlled on Xeljanz XR 11 mg daily.  No synovitis was noted.  He continues to have discomfort in his shoulder joints.  He has been working out on a regular basis.  Labs obtained on March 14 2022 showed normal sedimentation rate and C-reactive protein.  He has had 2 tapers of prednisone which helped his shoulder joints.  It appears that his shoulder joint symptoms are related to bursitis or tendinitis.  He has been going to the gym on a regular basis.  However, he has scaled back on the shoulder with training.  High risk medication use - Xeljanz XR 11 mg daily. -March 14, 2022 CBC normal, CMP normal, sed rate 2, CRP less than 1, TSH normal, PSA normal, B12 normal, LDL normal, hemoglobin A1c 5.7 Plan: CBC with Differential/Platelet, COMPLETE METABOLIC PANEL WITH GFR, QuantiFERON-TB Gold Plus.  Information on immunization was placed in the AVS.  He was advised to hold Morrie Sheldon  if he develops an infection and resume after the infection resolves.  Blackbox warning on Xeljanz for MACE and thrombosis were reviewed.  Handout was placed in the AVS.  HLA B27 (HLA B27 positive)  Chronic pain of both shoulders -he has been having pain and discomfort in his bilateral shoulders for  several months now.  He had tenderness over bilateral shoulders over the subacromial region.  He also had some discomfort with internal rotation.  No effusion was noted.  I plan to inject his bilateral shoulders today.  I will also refer him to physical therapy.  Plan: XR Shoulder Left, XR Shoulder Right.  X-rays showed mild acromioclavicular arthritis.  No glenohumeral joint space narrowing was noted.  X-ray findings were reviewed with the patient.  His clinical findings are consistent with subacromial bursitis.  After informed consent was obtained the subacromial region was injected with lidocaine and Kenalog as described above.  Patient tolerated the procedure well.  Postprocedure instructions were given.  Trochanteric bursitis of both hips - Resolved.  Crepitus of both knee joints-he had x-rays in the past which showed chondromalacia patella.  Lower extremity muscle strengthening exercises were advised.  He has been going to the gym on a regular basis.  Plantar fasciitis, left-no recurrence.  History of hypertension-blood pressure was elevated at 160/86.  After resting the blood pressure came down to 144/85.  I advised him to monitor blood pressure closely and follow-up with his PCP.  I also advised that the cortisone injection can increase his blood pressure.  It is okay  Mixed hyperlipidemia  OSA (obstructive sleep apnea)  Orders: Orders Placed This Encounter  Procedures   Large Joint Inj: bilateral subacromial bursa   XR Shoulder Left   XR Shoulder Right   CBC with Differential/Platelet   COMPLETE METABOLIC PANEL WITH GFR   QuantiFERON-TB Gold Plus   Ambulatory referral to Physical  Therapy   No orders of the defined types were placed in this encounter.    Follow-Up Instructions: Return in 3 months (on 08/31/2022) for Rheumatoid arthritis.   Bo Merino, MD  Note - This record has been created using Editor, commissioning.  Chart creation errors have been sought, but may not always  have been located. Such creation errors do not reflect on  the standard of medical care.

## 2022-06-01 ENCOUNTER — Ambulatory Visit: Payer: BC Managed Care – PPO | Attending: Rheumatology | Admitting: Rheumatology

## 2022-06-01 ENCOUNTER — Ambulatory Visit: Payer: BC Managed Care – PPO

## 2022-06-01 ENCOUNTER — Encounter: Payer: Self-pay | Admitting: Rheumatology

## 2022-06-01 ENCOUNTER — Ambulatory Visit (INDEPENDENT_AMBULATORY_CARE_PROVIDER_SITE_OTHER): Payer: BC Managed Care – PPO

## 2022-06-01 VITALS — BP 144/85 | HR 70 | Resp 18 | Ht 74.0 in | Wt 251.6 lb

## 2022-06-01 DIAGNOSIS — M722 Plantar fascial fibromatosis: Secondary | ICD-10-CM

## 2022-06-01 DIAGNOSIS — G8929 Other chronic pain: Secondary | ICD-10-CM | POA: Diagnosis not present

## 2022-06-01 DIAGNOSIS — M238X1 Other internal derangements of right knee: Secondary | ICD-10-CM

## 2022-06-01 DIAGNOSIS — M25511 Pain in right shoulder: Secondary | ICD-10-CM

## 2022-06-01 DIAGNOSIS — M25512 Pain in left shoulder: Secondary | ICD-10-CM

## 2022-06-01 DIAGNOSIS — Z79899 Other long term (current) drug therapy: Secondary | ICD-10-CM | POA: Diagnosis not present

## 2022-06-01 DIAGNOSIS — M7061 Trochanteric bursitis, right hip: Secondary | ICD-10-CM

## 2022-06-01 DIAGNOSIS — M238X2 Other internal derangements of left knee: Secondary | ICD-10-CM

## 2022-06-01 DIAGNOSIS — M0579 Rheumatoid arthritis with rheumatoid factor of multiple sites without organ or systems involvement: Secondary | ICD-10-CM

## 2022-06-01 DIAGNOSIS — E782 Mixed hyperlipidemia: Secondary | ICD-10-CM

## 2022-06-01 DIAGNOSIS — Z1589 Genetic susceptibility to other disease: Secondary | ICD-10-CM | POA: Diagnosis not present

## 2022-06-01 DIAGNOSIS — M7062 Trochanteric bursitis, left hip: Secondary | ICD-10-CM

## 2022-06-01 DIAGNOSIS — Z8679 Personal history of other diseases of the circulatory system: Secondary | ICD-10-CM

## 2022-06-01 DIAGNOSIS — G4733 Obstructive sleep apnea (adult) (pediatric): Secondary | ICD-10-CM

## 2022-06-01 MED ORDER — TRIAMCINOLONE ACETONIDE 40 MG/ML IJ SUSP
40.0000 mg | INTRAMUSCULAR | Status: AC | PRN
  Administered 2022-06-01: 40 mg via INTRA_ARTICULAR

## 2022-06-01 MED ORDER — LIDOCAINE HCL 1 % IJ SOLN
1.5000 mL | INTRAMUSCULAR | Status: AC | PRN
  Administered 2022-06-01: 1.5 mL

## 2022-06-01 NOTE — Patient Instructions (Signed)
Standing Labs We placed an order today for your standing lab work.   Please have your standing labs drawn in April and every 3 months  Please have your labs drawn 2 weeks prior to your appointment so that the provider can discuss your lab results at your appointment.  Please note that you may see your imaging and lab results in [facility] before we have reviewed them. We will contact you once all results are reviewed. Please allow our office up to 72 hours to thoroughly review all of the results before contacting the office for clarification of your results.  Lab hours are:   Monday through Thursday from 8:00 am -12:30 pm and 1:00 pm-5:00 pm and Friday from 8:00 am-12:00 pm.  Please be advised, all patients with office appointments requiring lab work will take precedent over walk-in lab work.   Labs are drawn by [facility]. Please bring your co-pay at the time of your lab draw.  You may receive a bill from [facility] for your lab work.  Please note if you are on Hydroxychloroquine and and an order has been placed for a Hydroxychloroquine level, you will need to have it drawn 4 hours or more after your last dose.  If you wish to have your labs drawn at another location, please call the office 24 hours in advance so we can fax the orders.  The office is located at [address], [place], [place], [place] [address] No appointment is necessary.    If you have any questions regarding directions or hours of operation,  please call [phone].   As a reminder, please drink plenty of water prior to coming for your lab work. Thanks!  Vaccines You are taking a medication(s) that can suppress your immune system.  The following immunizations are recommended: Flu annually Covid-19  Td/Tdap (tetanus, diphtheria, pertussis) every 10 years Pneumonia (Prevnar 15 then Pneumovax 23 at least 1 year apart.  Alternatively, can take Prevnar 20 without needing additional dose) Shingrix: 2 doses from 4 weeks to 6  months apart  Please check with your PCP to make sure you are up to date.   If you have signs or symptoms of an infection or start antibiotics: First, call your PCP for workup of your infection. Hold your medication through the infection, until you complete your antibiotics, and until symptoms resolve if you take the following: Injectable medication (Actemra, Benlysta, Cimzia, Cosentyx, Enbrel, Humira, Kevzara, Orencia, Remicade, Simponi, Stelara, Taltz, Tremfya) Methotrexate Leflunomide (Arava) Mycophenolate (Cellcept) Roma Kayser, or Rinvoq  Because you are taking Morrie Sheldon, Rinvoq, or Olumiant, it is very important to know that this class of medications has a FDA BLACK BOX WARNING for major adverse cardiovascular events (MACE), thrombosis, mortality (including sudden cardiovascular death), serious infections, and lymphomas. MACE is defined as cardiovascular death, myocardial infarction, and stroke. Thrombosis includes deep venous thrombosis (DVT), pulmonary embolism (PE), and arterial thrombosis. If you are a current or former smoker, you are at higher risk for MACE.   Shoulder Exercises Ask your health care provider which exercises are safe for you. Do exercises exactly as told by your health care provider and adjust them as directed. It is normal to feel mild stretching, pulling, tightness, or discomfort as you do these exercises. Stop right away if you feel sudden pain or your pain gets worse. Do not begin these exercises until told by your health care provider. Stretching exercises External rotation and abduction This exercise is sometimes called corner stretch. The exercise rotates your arm  outward (external rotation) and moves your arm out from your body (abduction). Stand in a doorway with one of your feet slightly in front of the other. This is called a staggered stance. If you cannot reach your forearms to the door frame, stand facing a corner of a room. Choose one of the  following positions as told by your health care provider: Place your hands and forearms on the door frame above your head. Place your hands and forearms on the door frame at the height of your head. Place your hands on the door frame at the height of your elbows. Slowly move your weight onto your front foot until you feel a stretch across your chest and in the front of your shoulders. Keep your head and chest upright and keep your abdominal muscles tight. Hold for __________ seconds. To release the stretch, shift your weight to your back foot. Repeat __________ times. Complete this exercise __________ times a day. Extension, standing  Stand and hold a broomstick, a cane, or a similar object behind your back. Your hands should be a little wider than shoulder-width apart. Your palms should face away from your back. Keeping your elbows straight and your shoulder muscles relaxed, move the stick away from your body until you feel a stretch in your shoulders (extension). Avoid shrugging your shoulders while you move the stick. Keep your shoulder blades tucked down toward the middle of your back. Hold for __________ seconds. Slowly return to the starting position. Repeat __________ times. Complete this exercise __________ times a day. Range-of-motion exercises Pendulum  Stand near a wall or a surface that you can hold onto for balance. Bend at the waist and let your left / right arm hang straight down. Use your other arm to support you. Keep your back straight and do not lock your knees. Relax your left / right arm and shoulder muscles, and move your hips and your trunk so your left / right arm swings freely. Your arm should swing because of the motion of your body, not because you are using your arm or shoulder muscles. Keep moving your hips and trunk so your arm swings in the following directions, as told by your health care provider: Side to side. Forward and backward. In clockwise and  counterclockwise circles. Continue each motion for __________ seconds, or for as long as told by your health care provider. Slowly return to the starting position. Repeat __________ times. Complete this exercise __________ times a day. Shoulder flexion, standing  Stand and hold a broomstick, a cane, or a similar object. Place your hands a little more than shoulder-width apart on the object. Your left / right hand should be palm-up, and your other hand should be palm-down. Keep your elbow straight and your shoulder muscles relaxed. Push the stick up with your healthy arm to raise your left / right arm in front of your body, and then over your head until you feel a stretch in your shoulder (flexion). Avoid shrugging your shoulder while you raise your arm. Keep your shoulder blade tucked down toward the middle of your back. Hold for __________ seconds. Slowly return to the starting position. Repeat __________ times. Complete this exercise __________ times a day. Shoulder abduction, standing  Stand and hold a broomstick, a cane, or a similar object. Place your hands a little more than shoulder-width apart on the object. Your left / right hand should be palm-up, and your other hand should be palm-down. Keep your elbow straight and your shoulder  muscles relaxed. Push the object across your body toward your left / right side. Raise your left / right arm to the side of your body (abduction) until you feel a stretch in your shoulder. Do not raise your arm above shoulder height unless your health care provider tells you to do that. If directed, raise your arm over your head. Avoid shrugging your shoulder while you raise your arm. Keep your shoulder blade tucked down toward the middle of your back. Hold for __________ seconds. Slowly return to the starting position. Repeat __________ times. Complete this exercise __________ times a day. Internal rotation  Place your left / right hand behind your back,  palm-up. Use your other hand to dangle an exercise band, a broomstick, or a similar object over your shoulder. Grasp the band with your left / right hand so you are holding on to both ends. Gently pull up on the band until you feel a stretch in the front of your left / right shoulder. The movement of your arm toward the center of your body is called internal rotation. Avoid shrugging your shoulder while you raise your arm. Keep your shoulder blade tucked down toward the middle of your back. Hold for __________ seconds. Release the stretch by letting go of the band and lowering your hands. Repeat __________ times. Complete this exercise __________ times a day. Strengthening exercises External rotation  Sit in a stable chair without armrests. Secure an exercise band to a stable object at elbow height on your left / right side. Place a soft object, such as a folded towel or a small pillow, between your left / right upper arm and your body to move your elbow about 4 inches (10 cm) away from your side. Hold the end of the exercise band so it is tight and there is no slack. Keeping your elbow pressed against the soft object, slowly move your forearm out, away from your abdomen (external rotation). Keep your body steady so only your forearm moves. Hold for __________ seconds. Slowly return to the starting position. Repeat __________ times. Complete this exercise __________ times a day. Shoulder abduction  Sit in a stable chair without armrests, or stand up. Hold a __________ lb / kg weight in your left / right hand, or hold an exercise band with both hands. Start with your arms straight down and your left / right palm facing in, toward your body. Slowly lift your left / right hand out to your side (abduction). Do not lift your hand above shoulder height unless your health care provider tells you that this is safe. Keep your arms straight. Avoid shrugging your shoulder while you do this movement.  Keep your shoulder blade tucked down toward the middle of your back. Hold for __________ seconds. Slowly lower your arm, and return to the starting position. Repeat __________ times. Complete this exercise __________ times a day. Shoulder extension  Sit in a stable chair without armrests, or stand up. Secure an exercise band to a stable object in front of you so it is at shoulder height. Hold one end of the exercise band in each hand. Straighten your elbows and lift your hands up to shoulder height. Squeeze your shoulder blades together as you pull your hands down to the sides of your thighs (extension). Stop when your hands are straight down by your sides. Do not let your hands go behind your body. Hold for __________ seconds. Slowly return to the starting position. Repeat __________ times. Complete this exercise  __________ times a day. Shoulder row  Sit in a stable chair without armrests, or stand up. Secure an exercise band to a stable object in front of you so it is at chest height. Hold one end of the exercise band in each hand. Position your palms so that your thumbs are facing the ceiling (neutral position). Bend each of your elbows to a 90-degree angle (right angle) and keep your upper arms at your sides. Step back or move the chair back until the band is tight and there is no slack. Slowly pull your elbows back behind you. Hold for __________ seconds. Slowly return to the starting position. Repeat __________ times. Complete this exercise __________ times a day. Shoulder press-ups  Sit in a stable chair that has armrests. Sit upright, with your feet flat on the floor. Put your hands on the armrests so your elbows are bent and your fingers are pointing forward. Your hands should be about even with the sides of your body. Push down on the armrests and use your arms to lift yourself off the chair. Straighten your elbows and lift yourself up as much as you comfortably can. Move your  shoulder blades down, and avoid letting your shoulders move up toward your ears. Keep your feet on the ground. As you get stronger, your feet should support less of your body weight as you lift yourself up. Hold for __________ seconds. Slowly lower yourself back into the chair. Repeat __________ times. Complete this exercise __________ times a day. Wall push-ups  Stand so you are facing a stable wall. Your feet should be about one arm-length away from the wall. Lean forward and place your palms on the wall at shoulder height. Keep your feet flat on the floor as you bend your elbows and lean forward toward the wall. Hold for __________ seconds. Straighten your elbows to push yourself back to the starting position. Repeat __________ times. Complete this exercise __________ times a day. This information is not intended to replace advice given to you by your health care provider. Make sure you discuss any questions you have with your health care provider. Document Revised: 07/04/2021 Document Reviewed: 07/04/2021 Elsevier Patient Education  [employer].

## 2022-06-03 LAB — CBC WITH DIFFERENTIAL/PLATELET
Absolute Monocytes: 559 cells/uL (ref 200–950)
Basophils Absolute: 10 cells/uL (ref 0–200)
Basophils Relative: 0.2 %
Eosinophils Absolute: 59 cells/uL (ref 15–500)
Eosinophils Relative: 1.2 %
HCT: 42.1 % (ref 38.5–50.0)
Hemoglobin: 14.7 g/dL (ref 13.2–17.1)
Lymphs Abs: 1083 cells/uL (ref 850–3900)
MCH: 31.5 pg (ref 27.0–33.0)
MCHC: 34.9 g/dL (ref 32.0–36.0)
MCV: 90.3 fL (ref 80.0–100.0)
MPV: 9.4 fL (ref 7.5–12.5)
Monocytes Relative: 11.4 %
Neutro Abs: 3190 cells/uL (ref 1500–7800)
Neutrophils Relative %: 65.1 %
Platelets: 225 10*3/uL (ref 140–400)
RBC: 4.66 10*6/uL (ref 4.20–5.80)
RDW: 12.1 % (ref 11.0–15.0)
Total Lymphocyte: 22.1 %
WBC: 4.9 10*3/uL (ref 3.8–10.8)

## 2022-06-03 LAB — QUANTIFERON-TB GOLD PLUS
Mitogen-NIL: >10 IU/mL
NIL: 0.02 IU/mL
QuantiFERON-TB Gold Plus: NEGATIVE
TB1-NIL: <0 IU/mL
TB2-NIL: <0 IU/mL

## 2022-06-03 LAB — COMPLETE METABOLIC PANEL WITH GFR
AG Ratio: 1.8 (calc) (ref 1.0–2.5)
ALT: 38 U/L (ref 9–46)
AST: 24 U/L (ref 10–35)
Albumin: 4.6 g/dL (ref 3.6–5.1)
Alkaline phosphatase (APISO): 60 U/L (ref 35–144)
BUN: 16 mg/dL (ref 7–25)
CO2: 26 mmol/L (ref 20–32)
Calcium: 8.9 mg/dL (ref 8.6–10.3)
Chloride: 107 mmol/L (ref 98–110)
Creat: 1.11 mg/dL (ref 0.70–1.30)
Globulin: 2.6 g/dL (calc) (ref 1.9–3.7)
Glucose, Bld: 108 mg/dL — ABNORMAL HIGH (ref 65–99)
Potassium: 4.1 mmol/L (ref 3.5–5.3)
Sodium: 138 mmol/L (ref 135–146)
Total Bilirubin: 0.5 mg/dL (ref 0.2–1.2)
Total Protein: 7.2 g/dL (ref 6.1–8.1)
eGFR: 80 mL/min/{1.73_m2} (ref >=60)

## 2022-06-04 NOTE — Progress Notes (Signed)
CBC and CMP are normal.  TB Gold is negative.

## 2022-06-13 ENCOUNTER — Other Ambulatory Visit: Payer: Self-pay | Admitting: Physician Assistant

## 2022-06-13 NOTE — Telephone Encounter (Signed)
Next Visit: 08/31/2022  Last Visit: 06/01/2022  Last Fill: 03/28/2022  OM:AYOKHTXHFS arthritis involving multiple sites with positive rheumatoid factor   Current Dose per office note 06/01/2022: Morrie Sheldon XR 11 mg daily   Labs: 06/01/2022 CBC and CMP are normal.   TB Gold: 06/01/2022 Neg    Okay to refill Morrie Sheldon?

## 2022-07-03 DIAGNOSIS — L570 Actinic keratosis: Secondary | ICD-10-CM | POA: Diagnosis not present

## 2022-07-03 DIAGNOSIS — C44619 Basal cell carcinoma of skin of left upper limb, including shoulder: Secondary | ICD-10-CM | POA: Diagnosis not present

## 2022-07-26 DIAGNOSIS — C44619 Basal cell carcinoma of skin of left upper limb, including shoulder: Secondary | ICD-10-CM | POA: Diagnosis not present

## 2022-08-06 ENCOUNTER — Other Ambulatory Visit: Payer: Self-pay | Admitting: Cardiovascular Disease

## 2022-08-21 NOTE — Progress Notes (Deleted)
Office Visit Note  Patient: Bobby Kramer             Date of Birth: 1971/01/20           MRN: [id number]             PCP: Jake Samples, PA-C Referring: Jake Samples, [place]* Visit Date: 08/31/2022 Occupation: @GUAROCC @  Subjective:  No chief complaint on file.   History of Present Illness: Bobby Kramer is a 52 y.o. male ***     Activities of Daily Living:  Patient reports morning stiffness for *** {minute/hour:19697}.   Patient {ACTIONS;DENIES/REPORTS:21021675::"Denies"} nocturnal pain.  Difficulty dressing/grooming: {ACTIONS;DENIES/REPORTS:21021675::"Denies"} Difficulty climbing stairs: {ACTIONS;DENIES/REPORTS:21021675::"Denies"} Difficulty getting out of chair: {ACTIONS;DENIES/REPORTS:21021675::"Denies"} Difficulty using hands for taps, buttons, cutlery, and/or writing: {ACTIONS;DENIES/REPORTS:21021675::"Denies"}  No Rheumatology ROS completed.   PMFS History:  Patient Active Problem List   Diagnosis Date Noted  . Special screening for malignant neoplasms, colon   . Pain in right hip 06/20/2016  . High risk medication use 05/31/2016  . History of hypertension 05/31/2016  . History of sleep apnea 05/31/2016  . HLA B27 (HLA B27 positive) 05/31/2016  . Trochanteric bursitis of both hips 05/31/2016  . OSA (obstructive sleep apnea) 01/08/2016  . Overweight 01/08/2016  . Hyperlipidemia 01/08/2016  . Rheumatoid arthritis ([facility]) 01/08/2016  . Cervicalgia 11/13/2013  . Sprain of neck 11/13/2013  . HTN (hypertension) 07/28/2013  . Mild obesity 07/28/2013    Past Medical History:  Diagnosis Date  . Hypertension   . Rheumatoid arthritis ([facility])   . Seasonal allergies   . Sleep apnea    uses a cpap    Family History  Problem Relation Age of Onset  . Ovarian cancer Mother   . Cancer Mother        colon   . Diabetes Father   . Heart disease Father   . Healthy Sister   . Healthy Daughter   . Healthy Daughter    Past Surgical History:  Procedure  Laterality Date  . CARDIAC CATHETERIZATION  05/09/2012   EF 55% Normal Coronary Arteries  . COLONOSCOPY N/A 04/04/2018   Procedure: COLONOSCOPY;  Surgeon: Danie Binder, MD;  Location: [facility];  Service: Endoscopy;  Laterality: N/A;  1:00 - pt knows to arrive at 10:30  . DOPPLER ECHOCARDIOGRAPHY  09/18/2011   EF>55%  . EXCISION MORTON'S NEUROMA  03/06/2012   Procedure: EXCISION MORTON'S NEUROMA;  Surgeon: Wylene Simmer, MD;  Location: [facility];  Service: Orthopedics;  Laterality: Right;  EXCISION OF RIGHT THIRD WEB SPACE NEUROMA  HENDER RETRACTOR *[facility] USED AS TOURNIQUET.  ON AT [id number].  OFF AT 0826.  Marland Kitchen GXT  09/18/2011  . LEFT HEART CATHETERIZATION WITH CORONARY ANGIOGRAM Bilateral 05/26/2012   Procedure: LEFT HEART CATHETERIZATION WITH CORONARY ANGIOGRAM;  Surgeon: Sanda Klein, MD;  Location: [facility];  Service: Cardiovascular;  Laterality: Bilateral;  . SYNOVECTOMY  03/06/2012   Procedure: SYNOVECTOMY;  Surgeon: Wylene Simmer, MD;  Location: [facility];  Service: Orthopedics;  Laterality: Right;  SYNOVECTOMY OF THIRD AND FOURTH METATARSAL PHALANGEAL JOINT  . WISDOM TOOTH EXTRACTION     Social History   Social History Narrative  . Not on file   Immunization History  Administered Date(s) Administered  . Influenza-Unspecified 02/26/2015, 03/28/2018  . Janssen (J&J) SARS-COV-2 Vaccination 11/06/2019  . Moderna Sars-Covid-2 Vaccination 04/27/2020     Objective: Vital Signs: There were no vitals taken for this visit.   Physical Exam   Musculoskeletal Exam: ***  CDAI Exam: CDAI Score: -- Patient Global: --; Provider Global: -- Swollen: --; Tender: -- Joint Exam 08/31/2022   No joint exam has been documented for this visit   There is currently no information documented on the homunculus. Go to the Rheumatology activity and complete the homunculus joint exam.  Investigation: No additional findings.  Imaging: No results  found.  Recent Labs: Lab Results  Component Value Date   WBC 4.9 06/01/2022   HGB 14.7 06/01/2022   PLT 225 06/01/2022   NA 138 06/01/2022   K 4.1 06/01/2022   CL 107 06/01/2022   CO2 26 06/01/2022   GLUCOSE 108 (H) 06/01/2022   BUN 16 06/01/2022   CREATININE 1.11 06/01/2022   BILITOT 0.5 06/01/2022   ALKPHOS 54 12/11/2016   AST 24 06/01/2022   ALT 38 06/01/2022   PROT 7.2 06/01/2022   ALBUMIN 4.7 12/11/2016   CALCIUM 8.9 06/01/2022   GFRAA >89 12/11/2016   QFTBGOLDPLUS NEGATIVE 06/01/2022    Speciality Comments: Last Labs: 01/12/2020  Procedures:  No procedures performed Allergies: Patient has no known allergies.   Assessment / Plan:     Visit Diagnoses: Rheumatoid arthritis involving multiple sites with positive rheumatoid factor (HCC)  High risk medication use  HLA B27 (HLA B27 positive)  Trochanteric bursitis of both hips  Crepitus of both knee joints  Plantar fasciitis, left  History of hypertension  Mixed hyperlipidemia  OSA (obstructive sleep apnea)  Orders: No orders of the defined types were placed in this encounter.  No orders of the defined types were placed in this encounter.   Face-to-face time spent with patient was *** minutes. Greater than 50% of time was spent in counseling and coordination of care.  Follow-Up Instructions: No follow-ups on file.   Ofilia Neas, PA-C  Note - This record has been created using Dragon software.  Chart creation errors have been sought, but may not always  have been located. Such creation errors do not reflect on  the standard of medical care.

## 2022-08-22 LAB — LAB REPORT - SCANNED
A1c: 5.7
EGFR: 71

## 2022-08-29 ENCOUNTER — Other Ambulatory Visit: Payer: Self-pay | Admitting: Physician Assistant

## 2022-08-29 NOTE — Telephone Encounter (Signed)
Last Fill: 06/13/2022  Labs: 08/21/2022 CBC/CMP WNL  TB Gold: 06/01/2022 Neg    Next Visit: 09/28/2022  Last Visit: 06/01/2022  DX: Rheumatoid arthritis involving multiple sites with positive rheumatoid factor   Current Dose per office note 06/01/2022: Bobby Kramer XR 11 mg daily   Okay to refill Bobby Kramer?

## 2022-08-31 ENCOUNTER — Ambulatory Visit: Payer: BC Managed Care – PPO | Admitting: Physician Assistant

## 2022-08-31 DIAGNOSIS — G4733 Obstructive sleep apnea (adult) (pediatric): Secondary | ICD-10-CM

## 2022-08-31 DIAGNOSIS — Z1589 Genetic susceptibility to other disease: Secondary | ICD-10-CM

## 2022-08-31 DIAGNOSIS — Z79899 Other long term (current) drug therapy: Secondary | ICD-10-CM

## 2022-08-31 DIAGNOSIS — E782 Mixed hyperlipidemia: Secondary | ICD-10-CM

## 2022-08-31 DIAGNOSIS — M238X1 Other internal derangements of right knee: Secondary | ICD-10-CM

## 2022-08-31 DIAGNOSIS — M0579 Rheumatoid arthritis with rheumatoid factor of multiple sites without organ or systems involvement: Secondary | ICD-10-CM

## 2022-08-31 DIAGNOSIS — M7061 Trochanteric bursitis, right hip: Secondary | ICD-10-CM

## 2022-08-31 DIAGNOSIS — M722 Plantar fascial fibromatosis: Secondary | ICD-10-CM

## 2022-08-31 DIAGNOSIS — Z8679 Personal history of other diseases of the circulatory system: Secondary | ICD-10-CM

## 2022-09-02 ENCOUNTER — Other Ambulatory Visit: Payer: Self-pay | Admitting: Cardiovascular Disease

## 2022-09-03 ENCOUNTER — Telehealth: Payer: Self-pay | Admitting: *Deleted

## 2022-09-03 NOTE — Telephone Encounter (Signed)
Labs received from:[facility]  Drawn on:08/21/2022  Reviewed by:Dr. Pollyann Savoy   Labs drawn:CMP, Hgb A1C, Vitamin D, Lipid Panel, CBC  Results:Hgb A1C 5.7 all other labs WNL  Patient in on Xeljanz 11 mg po daily.

## 2022-09-14 NOTE — Progress Notes (Signed)
Office Visit Note  Patient: Bobby Kramer             Date of Birth: 1971/04/13           MRN: [id number]             PCP: Avis Epley, PA-C Referring: Avis Epley, [place]* Visit Date: 09/28/2022 Occupation: @GUAROCC @  Subjective:  Medication monitoring   History of Present Illness: Bobby Kramer is a 53 y.o. male with history of seropositive rheumatoid arthritis.  Patient remains on Xeljanz 11 mg XR daily.  He is tolerating Harriette Ohara without any side effects and has not missed any doses recently.  He denies any recent rheumatoid arthritis flares.  He states that his shoulder joint pain has improved significantly since undergoing subacromial bursa cortisone injections on 06/01/2022.  He states he is no longer having nocturnal pain.  He has been able to increase his activity level and has been working out every other day.  He denies any other joint pain or joint swelling at this time. Patient recently had a viral infection and held [place] for about 11 days.  He denies any increase in symptoms during the gap in therapy.  He denies any recurrent infections.   Activities of Daily Living:  Patient reports morning stiffness for a few minutes  Patient Denies nocturnal pain.  Difficulty dressing/grooming: Denies Difficulty climbing stairs: Denies Difficulty getting out of chair: Denies Difficulty using hands for taps, buttons, cutlery, and/or writing: Denies  Review of Systems  Constitutional:  Negative for fatigue and night sweats.  HENT:  Negative for mouth sores, mouth dryness and nose dryness.   Eyes:  Negative for redness and dryness.  Respiratory:  Negative for cough, shortness of breath and difficulty breathing.   Cardiovascular:  Negative for chest pain, palpitations, hypertension, irregular heartbeat and swelling in legs/feet.  Gastrointestinal:  Negative for constipation and diarrhea.  Endocrine: Negative for increased urination.  Genitourinary:  Negative for painful  urination.  Musculoskeletal:  Negative for joint pain, joint pain, joint swelling, myalgias, muscle weakness, morning stiffness, muscle tenderness and myalgias.  Skin:  Negative for color change, rash, hair loss, nodules/bumps, skin tightness, ulcers and sensitivity to sunlight.  Allergic/Immunologic: Negative for susceptible to infections.  Neurological:  Negative for dizziness, fainting, memory loss, night sweats and weakness.  Hematological:  Negative for swollen glands.  Psychiatric/Behavioral:  Negative for depressed mood and sleep disturbance. The patient is not nervous/anxious.     PMFS History:  Patient Active Problem List   Diagnosis Date Noted   Special screening for malignant neoplasms, colon    Pain in right hip 06/20/2016   High risk medication use 05/31/2016   History of hypertension 05/31/2016   History of sleep apnea 05/31/2016   HLA B27 (HLA B27 positive) 05/31/2016   Trochanteric bursitis of both hips 05/31/2016   OSA (obstructive sleep apnea) 01/08/2016   Overweight 01/08/2016   Hyperlipidemia 01/08/2016   Rheumatoid arthritis (HCC) 01/08/2016   Cervicalgia 11/13/2013   Sprain of neck 11/13/2013   HTN (hypertension) 07/28/2013   Mild obesity 07/28/2013    Past Medical History:  Diagnosis Date   Hypertension    Rheumatoid arthritis (HCC)    Seasonal allergies    Sleep apnea    uses a cpap    Family History  Problem Relation Age of Onset   Ovarian cancer Mother    Cancer Mother        colon    Diabetes Father  Heart disease Father    Healthy Sister    Healthy Daughter    Healthy Daughter    Past Surgical History:  Procedure Laterality Date   CARDIAC CATHETERIZATION  05/09/2012   EF 55% Normal Coronary Arteries   COLONOSCOPY N/A 04/04/2018   Procedure: COLONOSCOPY;  Surgeon: West Bali, MD;  [facility];  Service: Endoscopy;  Laterality: N/A;  1:00 - pt knows to arrive at 10:30   DOPPLER ECHOCARDIOGRAPHY  09/18/2011   EF>55%    EXCISION MORTON'S NEUROMA  03/06/2012   Procedure: EXCISION MORTON'S NEUROMA;  Surgeon: Toni Arthurs, MD;  Location: [facility];  Service: Orthopedics;  Laterality: Right;  EXCISION OF RIGHT THIRD WEB SPACE NEUROMA  HENDER RETRACTOR *ESMARK USED AS TOURNIQUET.  ON AT 1610.  OFF AT 0826.   GXT  09/18/2011   LEFT HEART CATHETERIZATION WITH CORONARY ANGIOGRAM Bilateral 05/26/2012   Procedure: LEFT HEART CATHETERIZATION WITH CORONARY ANGIOGRAM;  Surgeon: Thurmon Fair, MD;  Location: [facility];  Service: Cardiovascular;  Laterality: Bilateral;   SYNOVECTOMY  03/06/2012   Procedure: SYNOVECTOMY;  Surgeon: Toni Arthurs, MD;  Location: [facility];  Service: Orthopedics;  Laterality: Right;  SYNOVECTOMY OF THIRD AND FOURTH METATARSAL PHALANGEAL JOINT   WISDOM TOOTH EXTRACTION     Social History   Social History Narrative   Not on file   Immunization History  Administered Date(s) Administered   Influenza-Unspecified 02/26/2015, 03/28/2018   Janssen (J&J) SARS-COV-2 Vaccination 11/06/2019   Moderna Sars-Covid-2 Vaccination 04/27/2020     Objective: Vital Signs: BP (!) 146/82 (BP Location: Left Arm, Patient Position: Sitting, Cuff Size: Large)   Pulse 65   Resp 15   Ht 6\' 2"  (1.88 m)   Wt 248 lb 3.2 oz (112.6 kg)   BMI 31.87 kg/m    Physical Exam Vitals and nursing note reviewed.  Constitutional:      Appearance: He is well-developed.  HENT:     Head: Normocephalic and atraumatic.  Eyes:     Conjunctiva/sclera: Conjunctivae normal.     Pupils: Pupils are equal, round, and reactive to light.  Cardiovascular:     Rate and Rhythm: Normal rate and regular rhythm.     Heart sounds: Normal heart sounds.  Pulmonary:     Effort: Pulmonary effort is normal.     Breath sounds: Normal breath sounds.  Abdominal:     General: Bowel sounds are normal.     Palpations: Abdomen is soft.  Musculoskeletal:     Cervical back: Normal range of motion and neck supple.   Skin:    General: Skin is warm and dry.     Capillary Refill: Capillary refill takes less than 2 seconds.  Neurological:     Mental Status: He is alert and oriented to person, place, and time.  Psychiatric:        Behavior: Behavior normal.      Musculoskeletal Exam: C-spine, thoracic spine, lumbar spine and good range of motion.  Shoulder joints, elbow joints, wrist joints, MCPs, PIPs, DIPs have good range of motion with no synovitis.  Complete fist formation bilaterally.  Hip joints have good range of motion with no groin pain.  Knee joints have good range of motion with no warmth or effusion.  Ankle joints have good range of motion with no tenderness or joint swelling.  CDAI Exam: CDAI Score: -- Patient Global: 0 mm; Provider Global: 0 mm Swollen: --; Tender: -- Joint Exam 09/28/2022   No joint exam  has been documented for this visit   There is currently no information documented on the homunculus. Go to the Rheumatology activity and complete the homunculus joint exam.  Investigation: No additional findings.  Imaging: No results found.  Recent Labs: Lab Results  Component Value Date   WBC 4.9 06/01/2022   HGB 14.7 06/01/2022   PLT 225 06/01/2022   NA 138 06/01/2022   K 4.1 06/01/2022   CL 107 06/01/2022   CO2 26 06/01/2022   GLUCOSE 108 (H) 06/01/2022   BUN 16 06/01/2022   CREATININE 1.11 06/01/2022   BILITOT 0.5 06/01/2022   ALKPHOS 54 12/11/2016   AST 24 06/01/2022   ALT 38 06/01/2022   PROT 7.2 06/01/2022   ALBUMIN 4.7 12/11/2016   CALCIUM 8.9 06/01/2022   GFRAA >89 12/11/2016   QFTBGOLDPLUS NEGATIVE 06/01/2022    Speciality Comments: Last Labs: 01/12/2020  Procedures:  No procedures performed Allergies: Patient has no known allergies.      Assessment / Plan:     Visit Diagnoses: Rheumatoid arthritis involving multiple sites with positive rheumatoid factor (HCC): He has no synovitis on examination today.  He has not had any signs or symptoms of a  rheumatoid arthritis flare.  He has clinically been doing well taking Xeljanz 11 mg XR daily.  He is tolerating Harriette Ohara without any side effects.  He recently had a viral infection and held [place] for 11 days but did not experience any breakthrough symptoms during the gap in therapy.  He has not had any recurrent infections.  His morning stiffness is only been lasting a few minutes.  The discomfort in his shoulders has resolved since having bilateral subacromial bursa cortisone injections on 06/01/2022.  He has not had any nocturnal pain or difficulty with ADLs.  He will remain on Xeljanz as monotherapy.  He was advised to notify us if he develops signs or symptoms of a flare.  He plans on continuing to work out every other day.  He will follow-up in the office in 5 months or sooner if needed.  High risk medication use - Xeljanz XR 11 mg daily. CBC, CMP, and lipid panel were updated on 08/22/2022.  His next CBC and CMP will be due in June and every 3 months to monitor for drug toxicity. TB Gold negative on 06/01/2022 and will continue to be monitored yearly. No recent or recurrent infections. Discussed the importance of holding Harriette Ohara if he develops signs or symptoms of an infection and to resume once the infection has completely cleared.  - Plan: CBC with Differential/Platelet, COMPLETE METABOLIC PANEL WITH GFR  HLA B27 (HLA B27 positive)  Chronic pain of both shoulders: Patient had bilateral subacromial bursa cortisone injections on 06/01/2022.  He has good range of motion about shoulder joints on examination today.  No tenderness upon palpation.  His symptoms improved significantly after having the cortisone injections performed.  He is no longer having nocturnal pain.  Trochanteric bursitis of both hips: No tenderness over the trochanteric bursa bilaterally.  Hip joints have good range of motion with no groin pain.  Crepitus of both knee joints: Good range of motion of both knee joints on examination  today.  No warmth or effusion noted.  Plantar fasciitis, left: Resolved.  Other medical conditions are listed as follows:  History of hypertension: Blood pressure was 146/82 today in the office.  Mixed hyperlipidemia  OSA (obstructive sleep apnea)  Orders: Orders Placed This Encounter  Procedures   CBC with Differential/Platelet  COMPLETE METABOLIC PANEL WITH GFR   No orders of the defined types were placed in this encounter.     Follow-Up Instructions: Return in about 5 months (around 02/28/2023) for Rheumatoid arthritis.   Gearldine Bienenstock, PA-C  Note - This record has been created using Dragon software.  Chart creation errors have been sought, but may not always  have been located. Such creation errors do not reflect on  the standard of medical care.

## 2022-09-24 ENCOUNTER — Telehealth: Payer: Self-pay | Admitting: Pharmacist

## 2022-09-24 NOTE — Telephone Encounter (Signed)
Patient due for [facility] renewal. Submitted a Prior Authorization renewal request to [facility] for [facility] via CoverMyMeds. Will update once we receive a response.  Key: [id number]  Chesley Mires, PharmD, MPH, BCPS, CPP Clinical Pharmacist (Rheumatology and Pulmonology)

## 2022-09-24 NOTE — Telephone Encounter (Signed)
Received notification from [facility] regarding a prior authorization for [facility]. Authorization has been APPROVED from 09/24/22 to 09/24/23. Approval letter sent to scan center.  Patient must continue to fill through [facility]: [phone]   Authorization # [id number]  Chesley Mires, PharmD, MPH, BCPS, CPP Clinical Pharmacist (Rheumatology and Pulmonology)

## 2022-09-28 ENCOUNTER — Encounter: Payer: Self-pay | Admitting: Physician Assistant

## 2022-09-28 ENCOUNTER — Ambulatory Visit: Payer: BC Managed Care – PPO | Attending: Physician Assistant | Admitting: Physician Assistant

## 2022-09-28 VITALS — BP 146/82 | HR 65 | Resp 15 | Ht 74.0 in | Wt 248.2 lb

## 2022-09-28 DIAGNOSIS — Z8679 Personal history of other diseases of the circulatory system: Secondary | ICD-10-CM

## 2022-09-28 DIAGNOSIS — M722 Plantar fascial fibromatosis: Secondary | ICD-10-CM

## 2022-09-28 DIAGNOSIS — M0579 Rheumatoid arthritis with rheumatoid factor of multiple sites without organ or systems involvement: Secondary | ICD-10-CM

## 2022-09-28 DIAGNOSIS — Z1589 Genetic susceptibility to other disease: Secondary | ICD-10-CM | POA: Diagnosis not present

## 2022-09-28 DIAGNOSIS — M7062 Trochanteric bursitis, left hip: Secondary | ICD-10-CM

## 2022-09-28 DIAGNOSIS — M25511 Pain in right shoulder: Secondary | ICD-10-CM | POA: Diagnosis not present

## 2022-09-28 DIAGNOSIS — Z79899 Other long term (current) drug therapy: Secondary | ICD-10-CM

## 2022-09-28 DIAGNOSIS — E782 Mixed hyperlipidemia: Secondary | ICD-10-CM

## 2022-09-28 DIAGNOSIS — M238X2 Other internal derangements of left knee: Secondary | ICD-10-CM

## 2022-09-28 DIAGNOSIS — G4733 Obstructive sleep apnea (adult) (pediatric): Secondary | ICD-10-CM

## 2022-09-28 DIAGNOSIS — G8929 Other chronic pain: Secondary | ICD-10-CM

## 2022-09-28 DIAGNOSIS — M25512 Pain in left shoulder: Secondary | ICD-10-CM

## 2022-09-28 DIAGNOSIS — M7061 Trochanteric bursitis, right hip: Secondary | ICD-10-CM

## 2022-09-28 DIAGNOSIS — M238X1 Other internal derangements of right knee: Secondary | ICD-10-CM

## 2022-09-28 NOTE — Patient Instructions (Signed)
Standing Labs We placed an order today for your standing lab work.   Please have your standing labs drawn at end of June and every 3 months   Please have your labs drawn 2 weeks prior to your appointment so that the provider can discuss your lab results at your appointment, if possible.  Please note that you may see your imaging and lab results in MyChart before we have reviewed them. We will contact you once all results are reviewed. Please allow our office up to 72 hours to thoroughly review all of the results before contacting the office for clarification of your results.  WALK-IN LAB HOURS  Monday through Thursday from 8:00 am -12:30 pm and 1:00 pm-5:00 pm and Friday from 8:00 am-12:00 pm.  Patients with office visits requiring labs will be seen before walk-in labs.  You may encounter longer than normal wait times. Please allow additional time. Wait times may be shorter on  Monday and Thursday afternoons.  We do not book appointments for walk-in labs. We appreciate your patience and understanding with our staff.   Labs are drawn by [facility]. Please bring your co-pay at the time of your lab draw.  You may receive a bill from [facility] for your lab work.  Please note if you are on Hydroxychloroquine and and an order has been placed for a Hydroxychloroquine level,  you will need to have it drawn 4 hours or more after your last dose.  If you wish to have your labs drawn at another location, please call the office 24 hours in advance so we can fax the orders.  The office is located at [address], [address], [place], [place] [address]   If you have any questions regarding directions or hours of operation,  please call [phone].   As a reminder, please drink plenty of water prior to coming for your lab work. Thanks!  

## 2022-10-02 ENCOUNTER — Other Ambulatory Visit: Payer: Self-pay | Admitting: Cardiovascular Disease

## 2022-10-23 NOTE — Progress Notes (Unsigned)
Office Visit Note  Patient: Bobby Kramer             Date of Birth: 1970-12-23           MRN: [id number]             PCP: Avis Epley, PA-C Referring: Avis Epley, [place]* Visit Date: 10/24/2022 Occupation: @GUAROCC @  Subjective:  Pain shoulders  History of Present Illness: Bobby Kramer is a 52 y.o. male with seropositive rheumatoid arthritis.  He was seen today on an urgent basis.  He returns today after his last visit on Sep 28, 2022.  He states on June 01, 2022 after bilateral shoulder joint injections he felt much better and his symptoms almost resolved.  He had been working out at the gym 3-4 times a week.  He states that the symptoms in his shoulder joints started coming back about 2 weeks ago.  He was also having nocturnal pain when he slept on his sides.  He states he cut back on the heavy lifting at the gym but did not notice any improvement.  None of the other joints are painful.  He takes he is on Xeljanz 11 mg XR daily.  He denies any morning stiffness.  He had no recurrence of trochanteric bursitis or plantar fasciitis.    Activities of Daily Living:  Patient reports morning stiffness for 0 minutes.   Patient Reports nocturnal pain.  Difficulty dressing/grooming: Denies Difficulty climbing stairs: Denies Difficulty getting out of chair: Denies Difficulty using hands for taps, buttons, cutlery, and/or writing: Denies  Review of Systems  Constitutional:  Negative for fatigue.  HENT:  Negative for mouth sores and mouth dryness.   Eyes:  Negative for dryness.  Respiratory:  Negative for shortness of breath.   Cardiovascular:  Negative for chest pain and palpitations.  Gastrointestinal:  Negative for blood in stool, constipation and diarrhea.  Endocrine: Negative for increased urination.  Genitourinary:  Negative for involuntary urination.  Musculoskeletal:  Positive for joint pain, joint pain, myalgias and myalgias. Negative for gait problem, joint  swelling, muscle weakness, morning stiffness and muscle tenderness.  Skin:  Negative for color change, rash, hair loss and sensitivity to sunlight.  Allergic/Immunologic: Negative for susceptible to infections.  Neurological:  Negative for dizziness and headaches.  Hematological:  Negative for swollen glands.  Psychiatric/Behavioral:  Positive for sleep disturbance. Negative for depressed mood. The patient is not nervous/anxious.     PMFS History:  Patient Active Problem List   Diagnosis Date Noted   Special screening for malignant neoplasms, colon    Pain in right hip 06/20/2016   High risk medication use 05/31/2016   History of hypertension 05/31/2016   History of sleep apnea 05/31/2016   HLA B27 (HLA B27 positive) 05/31/2016   Trochanteric bursitis of both hips 05/31/2016   OSA (obstructive sleep apnea) 01/08/2016   Overweight 01/08/2016   Hyperlipidemia 01/08/2016   Rheumatoid arthritis (HCC) 01/08/2016   Cervicalgia 11/13/2013   Sprain of neck 11/13/2013   HTN (hypertension) 07/28/2013   Mild obesity 07/28/2013    Past Medical History:  Diagnosis Date   Hypertension    Rheumatoid arthritis (HCC)    Seasonal allergies    Sleep apnea    uses a cpap    Family History  Problem Relation Age of Onset   Ovarian cancer Mother    Cancer Mother        colon    Diabetes Father    Heart disease  Father    Healthy Sister    Healthy Daughter    Healthy Daughter    Past Surgical History:  Procedure Laterality Date   CARDIAC CATHETERIZATION  05/09/2012   EF 55% Normal Coronary Arteries   COLONOSCOPY N/A 04/04/2018   Procedure: COLONOSCOPY;  Surgeon: West Bali, MD;  [facility];  Service: Endoscopy;  Laterality: N/A;  1:00 - pt knows to arrive at 10:30   DOPPLER ECHOCARDIOGRAPHY  09/18/2011   EF>55%   EXCISION MORTON'S NEUROMA  03/06/2012   Procedure: EXCISION MORTON'S NEUROMA;  Surgeon: Toni Arthurs, MD;  Location: [facility];  Service:  Orthopedics;  Laterality: Right;  EXCISION OF RIGHT THIRD WEB SPACE NEUROMA  HENDER RETRACTOR *ESMARK USED AS TOURNIQUET.  ON AT 1610.  OFF AT 0826.   GXT  09/18/2011   LEFT HEART CATHETERIZATION WITH CORONARY ANGIOGRAM Bilateral 05/26/2012   Procedure: LEFT HEART CATHETERIZATION WITH CORONARY ANGIOGRAM;  Surgeon: Thurmon Fair, MD;  Location: [facility];  Service: Cardiovascular;  Laterality: Bilateral;   SYNOVECTOMY  03/06/2012   Procedure: SYNOVECTOMY;  Surgeon: Toni Arthurs, MD;  Location: [facility];  Service: Orthopedics;  Laterality: Right;  SYNOVECTOMY OF THIRD AND FOURTH METATARSAL PHALANGEAL JOINT   WISDOM TOOTH EXTRACTION     Social History   Social History Narrative   Not on file   Immunization History  Administered Date(s) Administered   Influenza-Unspecified 02/26/2015, 03/28/2018   Janssen (J&J) SARS-COV-2 Vaccination 11/06/2019   Moderna Sars-Covid-2 Vaccination 04/27/2020     Objective: Vital Signs: BP 139/84 (BP Location: Left Arm, Patient Position: Sitting, Cuff Size: Large)   Pulse 64   Resp 17   Ht 6\' 2"  (1.88 m)   Wt 243 lb 6.4 oz (110.4 kg)   BMI 31.25 kg/m    Physical Exam Vitals and nursing note reviewed.  Constitutional:      Appearance: He is well-developed.  HENT:     Head: Normocephalic and atraumatic.  Eyes:     Conjunctiva/sclera: Conjunctivae normal.     Pupils: Pupils are equal, round, and reactive to light.  Cardiovascular:     Rate and Rhythm: Normal rate and regular rhythm.     Heart sounds: Normal heart sounds.  Pulmonary:     Effort: Pulmonary effort is normal.     Breath sounds: Normal breath sounds.  Abdominal:     General: Bowel sounds are normal.     Palpations: Abdomen is soft.  Musculoskeletal:     Cervical back: Normal range of motion and neck supple.  Skin:    General: Skin is warm and dry.     Capillary Refill: Capillary refill takes less than 2 seconds.  Neurological:     Mental Status: He is alert  and oriented to person, place, and time.  Psychiatric:        Behavior: Behavior normal.      Musculoskeletal Exam: Cervical, thoracic and lumbar spine were in good range of motion.  He had discomfort with abduction of bilateral shoulders and tenderness over bilateral subacromial region.  Both shoulder joints were in good range of motion.  Elbow joints, wrist joints, MCPs PIPs and DIPs were in good range of motion with no synovitis.  Hip joints and knee joints in good range of motion.  There was no tenderness over ankles or MTPs.  CDAI Exam: CDAI Score: 0.4  Patient Global: 2 mm; Provider Global: 2 mm Swollen: 0 ; Tender: 2  Joint Exam 10/24/2022  Right  Left  Acromioclavicular   Tender   Tender     Investigation: No additional findings.  Imaging: No results found.  Recent Labs: Lab Results  Component Value Date   WBC 4.9 06/01/2022   HGB 14.7 06/01/2022   PLT 225 06/01/2022   NA 138 06/01/2022   K 4.1 06/01/2022   CL 107 06/01/2022   CO2 26 06/01/2022   GLUCOSE 108 (H) 06/01/2022   BUN 16 06/01/2022   CREATININE 1.11 06/01/2022   BILITOT 0.5 06/01/2022   ALKPHOS 54 12/11/2016   AST 24 06/01/2022   ALT 38 06/01/2022   PROT 7.2 06/01/2022   ALBUMIN 4.7 12/11/2016   CALCIUM 8.9 06/01/2022   GFRAA >89 12/11/2016   QFTBGOLDPLUS NEGATIVE 06/01/2022     Speciality Comments: Last Labs: 01/12/2020  Procedures:  Large Joint Inj: bilateral subacromial bursa on 10/24/2022 4:19 PM Indications: pain Details: 27 G 1.5 in needle, posterior approach  Arthrogram: No  Medications (Right): 1.5 mL lidocaine 1 %; 40 mg triamcinolone acetonide 40 MG/ML Aspirate (Right): 0 mL Medications (Left): 1.5 mL lidocaine 1 %; 40 mg triamcinolone acetonide 40 MG/ML Aspirate (Left): 0 mL Outcome: tolerated well, no immediate complications Procedure, treatment alternatives, risks and benefits explained, specific risks discussed. Consent was given by the patient. Immediately prior to  procedure a time out was called to verify the correct patient, procedure, equipment, support staff and site/side marked as required. Patient was prepped and draped in the usual sterile fashion.     Allergies: Patient has no known allergies.   Assessment / Plan:     Visit Diagnoses: Rheumatoid arthritis involving multiple sites with positive rheumatoid factor (HCC)-patient states that his rheumatoid arthritis is well-controlled on Xeljanz XR 11 mg daily.  He had good response to bilateral shoulder joint injections in January 2024.  He was symptom-free for several weeks.  He states about 2 weeks ago he started having pain and discomfort in his both shoulders.  He goes to the gym and lifts weights almost every other day.  He states when he was doing overhead weights he started having increased discomfort in his shoulders.  He tried to back off on the weights but did not notice any improvement in his symptoms.  He is having nocturnal pain when he sleeps on his sides.  He requests cortisone injection to his shoulders today.  No synovitis was noted in any other joints.  High risk medication use - Xeljanz XR 11 mg daily.  Labs obtained on August 22, 2022 CBC WBC 4.1, hemoglobin 14.3, platelets 222, CMP AST 18, ALT 23, GFR 71, LDL 72, hemoglobin A1c 5.7, vitamin D 33.6 Were reviewed.  He was advised to get labs every 3 months.  Information on immunization was placed in the base.  Annual skin examination to screen for skin cancer was advised.  He was advised to hold Harriette Ohara if he develops an infection resume after the infection resolves.  HLA B27 (HLA B27 positive)  Chronic pain of both shoulders - Patient had bilateral subacromial bursa cortisone injections on 06/01/2022 with a good response.  He has been working out on a regular basis at [employer]  The symptoms recurred about 2 weeks ago.  He has been lifting heavy weights more than 25 pounds or heavier overhead.  I discouraged heavy weights overhead.  We  discussed different treatment options.  He declined physical therapy.  Per his request bilateral shoulder joint subacromial region were injected with lidocaine and Kenalog as described above.  Patient tolerated the procedure well.  Postprocedure instructions were given.  I also gave him a handout on exercises which she can do at home and at the gym.  I encouraged using bands, body weight and swimming.  He can advance exercises gradually.  I advised him to contact me if his symptoms recur.  Trochanteric bursitis of both hips-he had no tenderness over trochanteric bursa.  Crepitus of both knee joints-no warmth or effusion was noted.  Plantar fasciitis, left - Resolved.  History of hypertension-139/84 today.  OSA (obstructive sleep apnea)  Mixed hyperlipidemia  Orders: Orders Placed This Encounter  Procedures   Large Joint Inj   No orders of the defined types were placed in this encounter.  Face-to-face time spent patient was over 30 minutes.  More than 50% time was spent in counseling and coordination of care.  Follow-Up Instructions: Return in about 5 months (around 03/26/2023) for Rheumatoid arthritis.   Pollyann Savoy, MD  Note - This record has been created using Animal nutritionist.  Chart creation errors have been sought, but may not always  have been located. Such creation errors do not reflect on  the standard of medical care.

## 2022-10-24 ENCOUNTER — Encounter: Payer: Self-pay | Admitting: Rheumatology

## 2022-10-24 ENCOUNTER — Ambulatory Visit: Payer: BC Managed Care – PPO | Attending: Rheumatology | Admitting: Rheumatology

## 2022-10-24 VITALS — BP 139/84 | HR 64 | Resp 17 | Ht 74.0 in | Wt 243.4 lb

## 2022-10-24 DIAGNOSIS — Z1589 Genetic susceptibility to other disease: Secondary | ICD-10-CM | POA: Diagnosis not present

## 2022-10-24 DIAGNOSIS — G8929 Other chronic pain: Secondary | ICD-10-CM | POA: Diagnosis not present

## 2022-10-24 DIAGNOSIS — M25512 Pain in left shoulder: Secondary | ICD-10-CM | POA: Diagnosis not present

## 2022-10-24 DIAGNOSIS — M7062 Trochanteric bursitis, left hip: Secondary | ICD-10-CM

## 2022-10-24 DIAGNOSIS — M0579 Rheumatoid arthritis with rheumatoid factor of multiple sites without organ or systems involvement: Secondary | ICD-10-CM

## 2022-10-24 DIAGNOSIS — M722 Plantar fascial fibromatosis: Secondary | ICD-10-CM

## 2022-10-24 DIAGNOSIS — E782 Mixed hyperlipidemia: Secondary | ICD-10-CM

## 2022-10-24 DIAGNOSIS — M25511 Pain in right shoulder: Secondary | ICD-10-CM | POA: Diagnosis not present

## 2022-10-24 DIAGNOSIS — M238X2 Other internal derangements of left knee: Secondary | ICD-10-CM

## 2022-10-24 DIAGNOSIS — G4733 Obstructive sleep apnea (adult) (pediatric): Secondary | ICD-10-CM

## 2022-10-24 DIAGNOSIS — M238X1 Other internal derangements of right knee: Secondary | ICD-10-CM

## 2022-10-24 DIAGNOSIS — Z79899 Other long term (current) drug therapy: Secondary | ICD-10-CM | POA: Diagnosis not present

## 2022-10-24 DIAGNOSIS — Z8679 Personal history of other diseases of the circulatory system: Secondary | ICD-10-CM

## 2022-10-24 DIAGNOSIS — M7061 Trochanteric bursitis, right hip: Secondary | ICD-10-CM

## 2022-10-24 MED ORDER — TRIAMCINOLONE ACETONIDE 40 MG/ML IJ SUSP
40.0000 mg | INTRAMUSCULAR | Status: AC | PRN
Start: 2022-10-24 — End: 2022-10-24
  Administered 2022-10-24: 40 mg via INTRA_ARTICULAR

## 2022-10-24 MED ORDER — TRIAMCINOLONE ACETONIDE 40 MG/ML IJ SUSP
40.0000 mg | INTRAMUSCULAR | Status: AC | PRN
  Administered 2022-10-24: 40 mg via INTRA_ARTICULAR

## 2022-10-24 MED ORDER — LIDOCAINE HCL 1 % IJ SOLN
1.5000 mL | INTRAMUSCULAR | Status: AC | PRN
  Administered 2022-10-24: 1.5 mL

## 2022-10-24 MED ORDER — LIDOCAINE HCL 1 % IJ SOLN
1.5000 mL | INTRAMUSCULAR | Status: AC | PRN
Start: 2022-10-24 — End: 2022-10-24
  Administered 2022-10-24: 1.5 mL

## 2022-10-24 NOTE — Patient Instructions (Addendum)
Standing Labs We placed an order today for your standing lab work.   Please have your standing labs drawn in June and every 3 months  Please have your labs drawn 2 weeks prior to your appointment so that the provider can discuss your lab results at your appointment, if possible.  Please note that you may see your imaging and lab results in MyChart before we have reviewed them. We will contact you once all results are reviewed. Please allow our office up to 72 hours to thoroughly review all of the results before contacting the office for clarification of your results.  WALK-IN LAB HOURS  Monday through Thursday from 8:00 am -12:30 pm and 1:00 pm-5:00 pm and Friday from 8:00 am-12:00 pm.  Patients with office visits requiring labs will be seen before walk-in labs.  You may encounter longer than normal wait times. Please allow additional time. Wait times may be shorter on  Monday and Thursday afternoons.  We do not book appointments for walk-in labs. We appreciate your patience and understanding with our staff.   Labs are drawn by [facility]. Please bring your co-pay at the time of your lab draw.  You may receive a bill from [facility] for your lab work.  Please note if you are on Hydroxychloroquine and and an order has been placed for a Hydroxychloroquine level,  you will need to have it drawn 4 hours or more after your last dose.  If you wish to have your labs drawn at another location, please call the office 24 hours in advance so we can fax the orders.  The office is located at [address], [address], [place], [place] [address]   If you have any questions regarding directions or hours of operation,  please call [phone].   As a reminder, please drink plenty of water prior to coming for your lab work. Thanks!   Vaccines You are taking a medication(s) that can suppress your immune system.  The following immunizations are recommended: Flu annually Covid-19  Td/Tdap (tetanus, diphtheria,  pertussis) every 10 years Pneumonia (Prevnar 15 then Pneumovax 23 at least 1 year apart.  Alternatively, can take Prevnar 20 without needing additional dose) Shingrix: 2 doses from 4 weeks to 6 months apart  Please check with your PCP to make sure you are up to date.   If you have signs or symptoms of an infection or start antibiotics: First, call your PCP for workup of your infection. Hold your medication through the infection, until you complete your antibiotics, and until symptoms resolve if you take the following: Injectable medication (Actemra, Benlysta, Cimzia, Cosentyx, Enbrel, Humira, Kevzara, Orencia, Remicade, Simponi, Stelara, Taltz, Tremfya) Methotrexate Leflunomide (Arava) Mycophenolate (Cellcept) Osborne O[place], or Rinvoq   Because you are taking Harriette Ohara, Rinvoq, or Olumiant, it is very important to know that this class of medications has a FDA BLACK BOX WARNING for major adverse cardiovascular events (MACE), thrombosis, mortality (including sudden cardiovascular death), serious infections, and lymphomas. MACE is defined as cardiovascular death, myocardial infarction, and stroke. Thrombosis includes deep venous thrombosis (DVT), pulmonary embolism (PE), and arterial thrombosis. If you are a current or former smoker, you are at higher risk for MACE.    Shoulder Exercises Ask your health care provider which exercises are safe for you. Do exercises exactly as told by your health care provider and adjust them as directed. It is normal to feel mild stretching, pulling, tightness, or discomfort as you do these exercises. Stop right away if you feel sudden  pain or your pain gets worse. Do not begin these exercises until told by your health care provider. Stretching exercises External rotation and abduction This exercise is sometimes called corner stretch. The exercise rotates your arm outward (external rotation) and moves your arm out from your body (abduction). Stand in a doorway  with one of your feet slightly in front of the other. This is called a staggered stance. If you cannot reach your forearms to the door frame, stand facing a corner of a room. Choose one of the following positions as told by your health care provider: Place your hands and forearms on the door frame above your head. Place your hands and forearms on the door frame at the height of your head. Place your hands on the door frame at the height of your elbows. Slowly move your weight onto your front foot until you feel a stretch across your chest and in the front of your shoulders. Keep your head and chest upright and keep your abdominal muscles tight. Hold for __________ seconds. To release the stretch, shift your weight to your back foot. Repeat __________ times. Complete this exercise __________ times a day. Extension, standing  Stand and hold a broomstick, a cane, or a similar object behind your back. Your hands should be a little wider than shoulder-width apart. Your palms should face away from your back. Keeping your elbows straight and your shoulder muscles relaxed, move the stick away from your body until you feel a stretch in your shoulders (extension). Avoid shrugging your shoulders while you move the stick. Keep your shoulder blades tucked down toward the middle of your back. Hold for __________ seconds. Slowly return to the starting position. Repeat __________ times. Complete this exercise __________ times a day. Range-of-motion exercises Pendulum  Stand near a wall or a surface that you can hold onto for balance. Bend at the waist and let your left / right arm hang straight down. Use your other arm to support you. Keep your back straight and do not lock your knees. Relax your left / right arm and shoulder muscles, and move your hips and your trunk so your left / right arm swings freely. Your arm should swing because of the motion of your body, not because you are using your arm or  shoulder muscles. Keep moving your hips and trunk so your arm swings in the following directions, as told by your health care provider: Side to side. Forward and backward. In clockwise and counterclockwise circles. Continue each motion for __________ seconds, or for as long as told by your health care provider. Slowly return to the starting position. Repeat __________ times. Complete this exercise __________ times a day. Shoulder flexion, standing  Stand and hold a broomstick, a cane, or a similar object. Place your hands a little more than shoulder-width apart on the object. Your left / right hand should be palm-up, and your other hand should be palm-down. Keep your elbow straight and your shoulder muscles relaxed. Push the stick up with your healthy arm to raise your left / right arm in front of your body, and then over your head until you feel a stretch in your shoulder (flexion). Avoid shrugging your shoulder while you raise your arm. Keep your shoulder blade tucked down toward the middle of your back. Hold for __________ seconds. Slowly return to the starting position. Repeat __________ times. Complete this exercise __________ times a day. Shoulder abduction, standing  Stand and hold a broomstick, a cane, or a  similar object. Place your hands a little more than shoulder-width apart on the object. Your left / right hand should be palm-up, and your other hand should be palm-down. Keep your elbow straight and your shoulder muscles relaxed. Push the object across your body toward your left / right side. Raise your left / right arm to the side of your body (abduction) until you feel a stretch in your shoulder. Do not raise your arm above shoulder height unless your health care provider tells you to do that. If directed, raise your arm over your head. Avoid shrugging your shoulder while you raise your arm. Keep your shoulder blade tucked down toward the middle of your back. Hold for __________  seconds. Slowly return to the starting position. Repeat __________ times. Complete this exercise __________ times a day. Internal rotation  Place your left / right hand behind your back, palm-up. Use your other hand to dangle an exercise band, a broomstick, or a similar object over your shoulder. Grasp the band with your left / right hand so you are holding on to both ends. Gently pull up on the band until you feel a stretch in the front of your left / right shoulder. The movement of your arm toward the center of your body is called internal rotation. Avoid shrugging your shoulder while you raise your arm. Keep your shoulder blade tucked down toward the middle of your back. Hold for __________ seconds. Release the stretch by letting go of the band and lowering your hands. Repeat __________ times. Complete this exercise __________ times a day. Strengthening exercises External rotation  Sit in a stable chair without armrests. Secure an exercise band to a stable object at elbow height on your left / right side. Place a soft object, such as a folded towel or a small pillow, between your left / right upper arm and your body to move your elbow about 4 inches (10 cm) away from your side. Hold the end of the exercise band so it is tight and there is no slack. Keeping your elbow pressed against the soft object, slowly move your forearm out, away from your abdomen (external rotation). Keep your body steady so only your forearm moves. Hold for __________ seconds. Slowly return to the starting position. Repeat __________ times. Complete this exercise __________ times a day. Shoulder abduction  Sit in a stable chair without armrests, or stand up. Hold a __________ lb / kg weight in your left / right hand, or hold an exercise band with both hands. Start with your arms straight down and your left / right palm facing in, toward your body. Slowly lift your left / right hand out to your side (abduction).  Do not lift your hand above shoulder height unless your health care provider tells you that this is safe. Keep your arms straight. Avoid shrugging your shoulder while you do this movement. Keep your shoulder blade tucked down toward the middle of your back. Hold for __________ seconds. Slowly lower your arm, and return to the starting position. Repeat __________ times. Complete this exercise __________ times a day. Shoulder extension  Sit in a stable chair without armrests, or stand up. Secure an exercise band to a stable object in front of you so it is at shoulder height. Hold one end of the exercise band in each hand. Straighten your elbows and lift your hands up to shoulder height. Squeeze your shoulder blades together as you pull your hands down to the sides of your thighs (  extension). Stop when your hands are straight down by your sides. Do not let your hands go behind your body. Hold for __________ seconds. Slowly return to the starting position. Repeat __________ times. Complete this exercise __________ times a day. Shoulder row  Sit in a stable chair without armrests, or stand up. Secure an exercise band to a stable object in front of you so it is at chest height. Hold one end of the exercise band in each hand. Position your palms so that your thumbs are facing the ceiling (neutral position). Bend each of your elbows to a 90-degree angle (right angle) and keep your upper arms at your sides. Step back or move the chair back until the band is tight and there is no slack. Slowly pull your elbows back behind you. Hold for __________ seconds. Slowly return to the starting position. Repeat __________ times. Complete this exercise __________ times a day. Shoulder press-ups  Sit in a stable chair that has armrests. Sit upright, with your feet flat on the floor. Put your hands on the armrests so your elbows are bent and your fingers are pointing forward. Your hands should be about even  with the sides of your body. Push down on the armrests and use your arms to lift yourself off the chair. Straighten your elbows and lift yourself up as much as you comfortably can. Move your shoulder blades down, and avoid letting your shoulders move up toward your ears. Keep your feet on the ground. As you get stronger, your feet should support less of your body weight as you lift yourself up. Hold for __________ seconds. Slowly lower yourself back into the chair. Repeat __________ times. Complete this exercise __________ times a day. Wall push-ups  Stand so you are facing a stable wall. Your feet should be about one arm-length away from the wall. Lean forward and place your palms on the wall at shoulder height. Keep your feet flat on the floor as you bend your elbows and lean forward toward the wall. Hold for __________ seconds. Straighten your elbows to push yourself back to the starting position. Repeat __________ times. Complete this exercise __________ times a day. This information is not intended to replace advice given to you by your health care provider. Make sure you discuss any questions you have with your health care provider. Document Revised: 07/04/2021 Document Reviewed: 07/04/2021 Elsevier Patient Education  2024 [employer].

## 2022-11-21 ENCOUNTER — Ambulatory Visit
Admission: EM | Admit: 2022-11-21 | Discharge: 2022-11-21 | Disposition: A | Discharge status: Home / Self Care | Payer: BC Managed Care – PPO | Attending: Nurse Practitioner | Admitting: Nurse Practitioner

## 2022-11-21 ENCOUNTER — Other Ambulatory Visit: Payer: Self-pay | Admitting: Physician Assistant

## 2022-11-21 DIAGNOSIS — Z1152 Encounter for screening for COVID-19: Secondary | ICD-10-CM

## 2022-11-21 DIAGNOSIS — J069 Acute upper respiratory infection, unspecified: Secondary | ICD-10-CM | POA: Diagnosis not present

## 2022-11-21 MED ORDER — BENZONATATE 100 MG PO CAPS: 100.0000 mg | ORAL_CAPSULE | Freq: Three times a day (TID) | ORAL | 0 refills | Status: DC | PRN

## 2022-11-21 MED ORDER — PROMETHAZINE-DM 6.25-15 MG/5ML PO SYRP: 5.0000 mL | ORAL_SOLUTION | Freq: Every evening | ORAL | 0 refills | Status: DC | PRN

## 2022-11-21 NOTE — Discharge Instructions (Addendum)
You have a viral upper respiratory infection.  Symptoms should improve over the next week to 10 days.  If you develop chest pain or shortness of breath, go to the emergency room.  We have tested you today for COVID-19.  You will see the results in Mychart and we will call you with positive results.  If you test positive, you are a good candidate for molnupiravir.  We will be happy to prescribe this if positive.  Please stay home and isolate until you are aware of the results.    Some things that can make you feel better are: - Increased rest - Increasing fluid with water/sugar free electrolytes - Acetaminophen and ibuprofen as needed for fever/pain - Salt water gargling, chloraseptic spray and throat lozenges - OTC guaifenesin (Mucinex) 600 mg twice daily - Saline sinus flushes or a neti pot - Humidifying the air -Tessalon Perles during the day as needed for dry cough and cough syrup at nighttime as needed for dry cough

## 2022-11-21 NOTE — Telephone Encounter (Signed)
Last Fill: 08/29/2022  Labs: 08/22/2022 CBC WBC 4.1, hemoglobin 14.3, platelets 222, CMP AST 18, ALT 23, GFR 71, LDL 72, hemoglobin A1c 5.7, vitamin D 33.6   TB Gold: 06/01/2022 Neg    Next Visit: 03/04/2023  Last Visit: 10/24/2022  [id number] arthritis involving multiple sites with positive rheumatoid factor   Current Dose per office note 10/24/2022: Harriette Ohara XR 11 mg daily.   Okay to refill Harriette Ohara?

## 2022-11-21 NOTE — ED Provider Notes (Signed)
[facility]    CSN: [id number] Arrival date & time: 11/21/22  0906      History   Chief Complaint No chief complaint on file.   HPI Bobby Kramer is a 52 y.o. male.   Patient presents today with 4-day history of low-grade fever, body aches, congested cough that is worse at nighttime, chest tightness with coughing, postnasal drainage and sore throat, headache, decreased appetite, and fatigue.  He denies chills, shortness of breath or chest pain, runny or stuffy nose, abdominal pain, nausea/vomiting, and diarrhea.  No new rash.  Has been taking ibuprofen and Tylenol for symptoms, took some NyQuil last night without much benefit.  Reports he took some of his daughters cough syrup this morning that did seem to help a little bit.  Past medical history significant for rheumatoid arthritis.  He denies history of chronic lung disease.     Past Medical History:  Diagnosis Date   Hypertension    Rheumatoid arthritis (HCC)    Seasonal allergies    Sleep apnea    uses a cpap    Patient Active Problem List   Diagnosis Date Noted   Special screening for malignant neoplasms, colon    Pain in right hip 06/20/2016   High risk medication use 05/31/2016   History of hypertension 05/31/2016   History of sleep apnea 05/31/2016   HLA B27 (HLA B27 positive) 05/31/2016   Trochanteric bursitis of both hips 05/31/2016   OSA (obstructive sleep apnea) 01/08/2016   Overweight 01/08/2016   Hyperlipidemia 01/08/2016   Rheumatoid arthritis (HCC) 01/08/2016   Cervicalgia 11/13/2013   Sprain of neck 11/13/2013   HTN (hypertension) 07/28/2013   Mild obesity 07/28/2013    Past Surgical History:  Procedure Laterality Date   CARDIAC CATHETERIZATION  05/09/2012   EF 55% Normal Coronary Arteries   COLONOSCOPY N/A 04/04/2018   Procedure: COLONOSCOPY;  Surgeon: West Bali, MD;  Location: [facility];  Service: Endoscopy;  Laterality: N/A;  1:00 - pt knows to arrive at 10:30    DOPPLER ECHOCARDIOGRAPHY  09/18/2011   EF>55%   EXCISION MORTON'S NEUROMA  03/06/2012   Procedure: EXCISION MORTON'S NEUROMA;  Surgeon: Toni Arthurs, MD;  Location: [facility];  Service: Orthopedics;  Laterality: Right;  EXCISION OF RIGHT THIRD WEB SPACE NEUROMA  HENDER RETRACTOR *ESMARK USED AS TOURNIQUET.  ON AT 4098.  OFF AT 0826.   GXT  09/18/2011   LEFT HEART CATHETERIZATION WITH CORONARY ANGIOGRAM Bilateral 05/26/2012   Procedure: LEFT HEART CATHETERIZATION WITH CORONARY ANGIOGRAM;  Surgeon: Thurmon Fair, MD;  Location: [facility];  Service: Cardiovascular;  Laterality: Bilateral;   SYNOVECTOMY  03/06/2012   Procedure: SYNOVECTOMY;  Surgeon: Toni Arthurs, MD;  Location: [facility];  Service: Orthopedics;  Laterality: Right;  SYNOVECTOMY OF THIRD AND FOURTH METATARSAL PHALANGEAL JOINT   WISDOM TOOTH EXTRACTION         Home Medications    Prior to Admission medications   Medication Sig Start Date End Date Taking? Authorizing Provider  benzonatate (TESSALON) 100 MG capsule Take 1 capsule (100 mg total) by mouth 3 (three) times daily as needed for cough. Do not take with alcohol or while driving or operating heavy machinery.  May cause drowsiness. 11/21/22  Yes Valentino Nose, NP  promethazine-dextromethorphan (PROMETHAZINE-DM) 6.25-15 MG/5ML syrup Take 5 mLs by mouth at bedtime as needed for cough. Do not take with alcohol or while driving or operating heavy machinery.  May cause drowsiness. 11/21/22  Yes Cathlean Marseilles A, NP  amLODipine-olmesartan (AZOR) 5-20 MG tablet TAKE 1 TABLET BY MOUTH DAILY 09/04/22   Croitoru, Rachelle Hora, MD  aspirin 81 MG EC tablet 1 tablet    [provider]  hydrochlorothiazide (MICROZIDE) 12.5 MG capsule TAKE 1 CAPSULE BY MOUTH EVERY  OTHER DAY 05/03/22   Croitoru, Mihai, MD  Multiple Vitamin (MULTIVITAMIN) tablet Take 1 tablet by mouth daily.    [provider]  rosuvastatin (CRESTOR) 5 MG tablet Take 5 mg by  mouth daily.    [provider]  XELJANZ XR 11 MG TB24 TAKE 1 TABLET BY MOUTH DAILY 08/29/22   Gearldine Bienenstock, PA-C    Family History Family History  Problem Relation Age of Onset   Ovarian cancer Mother    Cancer Mother        colon    Diabetes Father    Heart disease Father    Healthy Sister    Healthy Daughter    Healthy Daughter     Social History Social History   Tobacco Use   Smoking status: Former    Types: Cigarettes    Quit date: 03/05/2007    Years since quitting: 15.7    Passive exposure: Never   Smokeless tobacco: Never   Tobacco comments:    recreational   Vaping Use   Vaping Use: Never used  Substance Use Topics   Alcohol use: Yes    Comment: rarely   Drug use: Never     Allergies   Patient has no known allergies.   Review of Systems Review of Systems Per HPI  Physical Exam Triage Vital Signs ED Triage Vitals  Enc Vitals Group     BP 11/21/22 0959 (!) 142/83     Pulse Rate 11/21/22 0959 83     Resp 11/21/22 0959 (!) 21     Temp 11/21/22 0959 99.9 F (37.7 C)     Temp Source 11/21/22 0959 Oral     SpO2 11/21/22 0959 94 %     Weight --      Height --      Head Circumference --      Peak Flow --      Pain Score 11/21/22 1001 5     Pain Loc --      Pain Edu? --      Excl. in GC? --    No data found.  Updated Vital Signs BP (!) 142/83 (BP Location: Right Arm)   Pulse 83   Temp 99.9 F (37.7 C) (Oral)   Resp (!) 21   SpO2 94%   Visual Acuity Right Eye Distance:   Left Eye Distance:   Bilateral Distance:    Right Eye Near:   Left Eye Near:    Bilateral Near:     Physical Exam Vitals and nursing note reviewed.  Constitutional:      General: He is not in acute distress.    Appearance: Normal appearance. He is not ill-appearing or toxic-appearing.  HENT:     Head: Normocephalic and atraumatic.     Right Ear: Tympanic membrane, ear canal and external ear normal.     Left Ear: Tympanic membrane, ear canal and  external ear normal.     Nose: No congestion or rhinorrhea.     Mouth/Throat:     Mouth: Mucous membranes are moist.     Pharynx: Oropharynx is clear. Posterior oropharyngeal erythema present. No oropharyngeal exudate.  Eyes:     General: No scleral  icterus.    Extraocular Movements: Extraocular movements intact.  Cardiovascular:     Rate and Rhythm: Normal rate and regular rhythm.  Pulmonary:     Effort: Pulmonary effort is normal. No respiratory distress.     Breath sounds: Normal breath sounds. No wheezing, rhonchi or rales.  Musculoskeletal:     Cervical back: Normal range of motion and neck supple.  Lymphadenopathy:     Cervical: No cervical adenopathy.  Skin:    General: Skin is warm and dry.     Coloration: Skin is not jaundiced or pale.     Findings: No erythema or rash.  Neurological:     Mental Status: He is alert and oriented to person, place, and time.     Motor: No weakness.  Psychiatric:        Behavior: Behavior is cooperative.      UC Treatments / Results  Labs (all labs ordered are listed, but only abnormal results are displayed) Labs Reviewed  SARS CORONAVIRUS 2 (TAT 6-24 HRS)    EKG   Radiology No results found.  Procedures Procedures (including critical care time)  Medications Ordered in UC Medications - No data to display  Initial Impression / Assessment and Plan / UC Course  I have reviewed the triage vital signs and the nursing notes.  Pertinent labs & imaging results that were available during my care of the patient were reviewed by me and considered in my medical decision making (see chart for details).   Patient is well-appearing, afebrile, not tachycardic, oxygenating well on room air.  Patient is mildly hypertensive and mildly tachypneic in triage.  SpO2 is normal, patient has a low-grade fever.  1. Viral URI with cough 2. Encounter for screening for COVID-19 Suspect viral etiology Vitals and exam today are reassuring Supportive  care discussed including starting Mucinex, cough suppressant medication COVID-19 testing obtained, patient is a candidate for molnupiravir if he test positive ER and return precautions discussed with patient  The patient was given the opportunity to ask questions.  All questions answered to their satisfaction.  The patient is in agreement to this plan.    Final Clinical Impressions(s) / UC Diagnoses   Final diagnoses:  Viral URI with cough  Encounter for screening for COVID-19     Discharge Instructions      You have a viral upper respiratory infection.  Symptoms should improve over the next week to 10 days.  If you develop chest pain or shortness of breath, go to the emergency room.  We have tested you today for COVID-19.  You will see the results in Mychart and we will call you with positive results.  If you test positive, you are a good candidate for molnupiravir.  We will be happy to prescribe this if positive.  Please stay home and isolate until you are aware of the results.    Some things that can make you feel better are: - Increased rest - Increasing fluid with water/sugar free electrolytes - Acetaminophen and ibuprofen as needed for fever/pain - Salt water gargling, chloraseptic spray and throat lozenges - OTC guaifenesin (Mucinex) 600 mg twice daily - Saline sinus flushes or a neti pot - Humidifying the air -Tessalon Perles during the day as needed for dry cough and cough syrup at nighttime as needed for dry cough     ED Prescriptions     Medication Sig Dispense Auth. Provider   benzonatate (TESSALON) 100 MG capsule Take 1 capsule (100 mg total) by  mouth 3 (three) times daily as needed for cough. Do not take with alcohol or while driving or operating heavy machinery.  May cause drowsiness. 21 capsule Cathlean Marseilles A, NP   promethazine-dextromethorphan (PROMETHAZINE-DM) 6.25-15 MG/5ML syrup Take 5 mLs by mouth at bedtime as needed for cough. Do not take with alcohol  or while driving or operating heavy machinery.  May cause drowsiness. 118 mL Valentino Nose, NP      PDMP not reviewed this encounter.   Valentino Nose, NP 11/21/22 [phone]

## 2022-11-21 NOTE — ED Triage Notes (Signed)
Pt c/o cough chest congestion,fever headache body aches  x 5 days .

## 2022-11-22 LAB — SARS CORONAVIRUS 2 (TAT 6-24 HRS): SARS Coronavirus 2: NEGATIVE

## 2022-12-20 LAB — LAB REPORT - SCANNED
A1c: 5.8
EGFR: 97

## 2022-12-21 ENCOUNTER — Telehealth: Payer: Self-pay | Admitting: *Deleted

## 2022-12-21 NOTE — Telephone Encounter (Signed)
Labs received from:[facility]  Drawn on:12/19/2022  Reviewed by:Sherron Ales, PA-C  Labs drawn:CMP, Hgb A1C, TSH, Vitamin D, Lipid Panel, CBC  Results: Hgb A1C 5.8    WBC 3.3  Patient on Xeljanz 11 mg po daily.   Per Ladona Ridgel, we will continue to monitor CBC with diff closely.

## 2023-01-01 DIAGNOSIS — L570 Actinic keratosis: Secondary | ICD-10-CM | POA: Diagnosis not present

## 2023-02-12 ENCOUNTER — Other Ambulatory Visit: Payer: Self-pay | Admitting: Rheumatology

## 2023-02-12 NOTE — Telephone Encounter (Signed)
Last Fill: 11/21/2022  Labs: 12/19/2022 Hgb A1C 5.8   WBC 3.3  TB Gold: 06/01/2022 Neg    Next Visit: 03/04/2023  Last Visit: 10/24/2022  DX: Rheumatoid arthritis involving multiple sites with positive rheumatoid factor    Current Dose per office note 10/24/2022: Bobby Kramer XR 11 mg daily   Okay to refill Bobby Kramer?

## 2023-03-04 ENCOUNTER — Encounter: Payer: Self-pay | Admitting: Physician Assistant

## 2023-03-04 ENCOUNTER — Ambulatory Visit: Payer: BC Managed Care – PPO | Attending: Physician Assistant | Admitting: Physician Assistant

## 2023-03-04 VITALS — BP 139/90 | HR 61 | Resp 17 | Ht 74.0 in | Wt 249.8 lb

## 2023-03-04 DIAGNOSIS — M238X1 Other internal derangements of right knee: Secondary | ICD-10-CM

## 2023-03-04 DIAGNOSIS — Z111 Encounter for screening for respiratory tuberculosis: Secondary | ICD-10-CM

## 2023-03-04 DIAGNOSIS — M722 Plantar fascial fibromatosis: Secondary | ICD-10-CM

## 2023-03-04 DIAGNOSIS — M0579 Rheumatoid arthritis with rheumatoid factor of multiple sites without organ or systems involvement: Secondary | ICD-10-CM

## 2023-03-04 DIAGNOSIS — Z79899 Other long term (current) drug therapy: Secondary | ICD-10-CM

## 2023-03-04 DIAGNOSIS — G8929 Other chronic pain: Secondary | ICD-10-CM

## 2023-03-04 DIAGNOSIS — M25511 Pain in right shoulder: Secondary | ICD-10-CM | POA: Diagnosis not present

## 2023-03-04 DIAGNOSIS — M238X2 Other internal derangements of left knee: Secondary | ICD-10-CM

## 2023-03-04 DIAGNOSIS — Z8679 Personal history of other diseases of the circulatory system: Secondary | ICD-10-CM

## 2023-03-04 DIAGNOSIS — E782 Mixed hyperlipidemia: Secondary | ICD-10-CM

## 2023-03-04 DIAGNOSIS — G4733 Obstructive sleep apnea (adult) (pediatric): Secondary | ICD-10-CM

## 2023-03-04 DIAGNOSIS — Z1589 Genetic susceptibility to other disease: Secondary | ICD-10-CM

## 2023-03-04 DIAGNOSIS — M7062 Trochanteric bursitis, left hip: Secondary | ICD-10-CM

## 2023-03-04 DIAGNOSIS — M7061 Trochanteric bursitis, right hip: Secondary | ICD-10-CM

## 2023-03-04 DIAGNOSIS — M25512 Pain in left shoulder: Secondary | ICD-10-CM

## 2023-03-04 NOTE — Patient Instructions (Addendum)

## 2023-03-25 DIAGNOSIS — Z23 Encounter for immunization: Secondary | ICD-10-CM | POA: Diagnosis not present

## 2023-03-28 LAB — LAB REPORT - SCANNED
A1c: 6.2
EGFR: 83

## 2023-04-02 ENCOUNTER — Telehealth: Payer: Self-pay | Admitting: *Deleted

## 2023-04-02 NOTE — Telephone Encounter (Signed)
Labs received from:[facility]  Drawn on: 03/28/2023  Reviewed by: Sherron Ales, PA-C  Labs drawn: CMP, CBC, Hgb A1C, Lipid Panel  Results: Hgb A1C 6.2  Patient is on Xeljanz 11 mg po daily.

## 2023-04-09 ENCOUNTER — Telehealth: Payer: Self-pay | Admitting: *Deleted

## 2023-04-09 NOTE — Telephone Encounter (Signed)
I returned call from Bobby Riley, NP.  She stated that patient has elevated hemoglobin A1c and also hyperlipidemia.  She is planning to start him on a semiglutide.  She wanted to know if there are any contraindications.  I discussed that there are no contraindications based on his medication list and his diagnosis.

## 2023-04-09 NOTE — Telephone Encounter (Signed)
Clovis Riley, NP contacted the office requesting a call back from Dr. Corliss Skains. She states it is not urgent. Her call back number is [phone].  She states she just has a couple of questions.

## 2023-04-12 ENCOUNTER — Encounter: Payer: Self-pay | Admitting: *Deleted

## 2023-04-30 ENCOUNTER — Other Ambulatory Visit: Payer: Self-pay | Admitting: Physician Assistant

## 2023-04-30 NOTE — Telephone Encounter (Signed)
Last Fill: 02/12/2023  Labs: 03/28/2023 CBC, CMP WNL  TB Gold: 06/01/2022 Neg    Next Visit: 08/21/2023  Last Visit: 03/04/2023  FA:[id number] arthritis involving multiple sites with positive rheumatoid factor   Current Dose per office note 03/04/2023: Harriette Ohara XR 11 mg by mouth daily.   Okay to refill Harriette Ohara?

## 2023-06-29 DIAGNOSIS — C4491 Basal cell carcinoma of skin, unspecified: Secondary | ICD-10-CM

## 2023-06-29 HISTORY — DX: Basal cell carcinoma of skin, unspecified: C44.91

## 2023-07-03 DIAGNOSIS — L570 Actinic keratosis: Secondary | ICD-10-CM | POA: Diagnosis not present

## 2023-07-03 DIAGNOSIS — L98429 Non-pressure chronic ulcer of back with unspecified severity: Secondary | ICD-10-CM | POA: Diagnosis not present

## 2023-07-03 DIAGNOSIS — D485 Neoplasm of uncertain behavior of skin: Secondary | ICD-10-CM | POA: Diagnosis not present

## 2023-07-03 DIAGNOSIS — C44519 Basal cell carcinoma of skin of other part of trunk: Secondary | ICD-10-CM | POA: Diagnosis not present

## 2023-07-04 ENCOUNTER — Ambulatory Visit
Admission: EM | Admit: 2023-07-04 | Discharge: 2023-07-04 | Disposition: A | Discharge status: Home / Self Care | Payer: BC Managed Care – PPO | Attending: Nurse Practitioner | Admitting: Nurse Practitioner

## 2023-07-04 DIAGNOSIS — R6889 Other general symptoms and signs: Secondary | ICD-10-CM | POA: Diagnosis not present

## 2023-07-04 LAB — POC COVID19/FLU A&B COMBO
Covid Antigen, POC: NEGATIVE
Influenza A Antigen, POC: NEGATIVE
Influenza B Antigen, POC: NEGATIVE

## 2023-07-04 MED ORDER — PROMETHAZINE-DM 6.25-15 MG/5ML PO SYRP
5.0000 mL | ORAL_SOLUTION | Freq: Every evening | ORAL | 0 refills | Status: DC | PRN
Start: 2023-07-04 — End: 2023-08-01

## 2023-07-04 MED ORDER — BENZONATATE 100 MG PO CAPS: 100.0000 mg | ORAL_CAPSULE | Freq: Three times a day (TID) | ORAL | 0 refills | Status: DC | PRN

## 2023-07-04 MED ORDER — OSELTAMIVIR PHOSPHATE 75 MG PO CAPS
75.0000 mg | ORAL_CAPSULE | Freq: Two times a day (BID) | ORAL | 0 refills | Status: AC
Start: 2023-07-04 — End: 2023-07-09

## 2023-07-04 NOTE — ED Triage Notes (Signed)
 Pt reports cough, nasal congestion, chest congestion, fever, headache body ache x 1 day.

## 2023-07-04 NOTE — ED Provider Notes (Signed)
 [facility]    CSN: [id number] Arrival date & time: 07/04/23  1301      History   Chief Complaint No chief complaint on file.   HPI Bobby Kramer is a 53 y.o. male.   Patient presents today with 1 day history of bodyaches and chills, congested and dry cough, chest pain when he coughs, runny and stuffy nose, headache, eye pain, low back pain, diarrhea, decreased appetite, and fatigue.  No shortness of breath, sore throat, nausea, or vomiting.  No ear pain.  No known sick contacts.  Patient has taken over-the-counter throat lozenges, tried hydration, and Advil/Tylenol  alternating for symptoms with minimal improvement.    Past Medical History:  Diagnosis Date   Hypertension    Rheumatoid arthritis (HCC)    Seasonal allergies    Sleep apnea    uses a cpap    Patient Active Problem List   Diagnosis Date Noted   Special screening for malignant neoplasms, colon    Pain in right hip 06/20/2016   High risk medication use 05/31/2016   History of hypertension 05/31/2016   History of sleep apnea 05/31/2016   HLA B27 (HLA B27 positive) 05/31/2016   Trochanteric bursitis of both hips 05/31/2016   OSA (obstructive sleep apnea) 01/08/2016   Overweight 01/08/2016   Hyperlipidemia 01/08/2016   Rheumatoid arthritis (HCC) 01/08/2016   Cervicalgia 11/13/2013   Sprain of neck 11/13/2013   HTN (hypertension) 07/28/2013   Mild obesity 07/28/2013    Past Surgical History:  Procedure Laterality Date   CARDIAC CATHETERIZATION  05/09/2012   EF 55% Normal Coronary Arteries   COLONOSCOPY N/A 04/04/2018   Procedure: COLONOSCOPY;  Surgeon: Harvey Margo CROME, MD;  Location: [facility];  Service: Endoscopy;  Laterality: N/A;  1:00 - pt knows to arrive at 10:30   DOPPLER ECHOCARDIOGRAPHY  09/18/2011   EF>55%   EXCISION MORTON'S NEUROMA  03/06/2012   Procedure: EXCISION MORTON'S NEUROMA;  Surgeon: Norleen Armor, MD;  Location: [facility];  Service: Orthopedics;   Laterality: Right;  EXCISION OF RIGHT THIRD WEB SPACE NEUROMA  HENDER RETRACTOR *ESMARK USED AS TOURNIQUET.  ON AT 9051.  OFF AT 0826.   GXT  09/18/2011   LEFT HEART CATHETERIZATION WITH CORONARY ANGIOGRAM Bilateral 05/26/2012   Procedure: LEFT HEART CATHETERIZATION WITH CORONARY ANGIOGRAM;  Surgeon: Jerel Balding, MD;  Location: [facility];  Service: Cardiovascular;  Laterality: Bilateral;   SYNOVECTOMY  03/06/2012   Procedure: SYNOVECTOMY;  Surgeon: Norleen Armor, MD;  Location: [facility];  Service: Orthopedics;  Laterality: Right;  SYNOVECTOMY OF THIRD AND FOURTH METATARSAL PHALANGEAL JOINT   WISDOM TOOTH EXTRACTION         Home Medications    Prior to Admission medications   Medication Sig Start Date End Date Taking? Authorizing Provider  oseltamivir  (TAMIFLU ) 75 MG capsule Take 1 capsule (75 mg total) by mouth every 12 (twelve) hours for 5 days. 07/04/23 07/09/23 Yes Chandra Harlene LABOR, NP  amLODipine -olmesartan  (AZOR ) 5-20 MG tablet TAKE 1 TABLET BY MOUTH DAILY 09/04/22   Croitoru, Mihai, MD  aspirin  81 MG EC tablet 1 tablet    [provider]  benzonatate  (TESSALON ) 100 MG capsule Take 1 capsule (100 mg total) by mouth 3 (three) times daily as needed for cough. Do not take with alcohol or while driving or operating heavy machinery.  May cause drowsiness. 07/04/23   Chandra Harlene LABOR, NP  hydrochlorothiazide  (MICROZIDE ) 12.5 MG capsule TAKE 1 CAPSULE BY MOUTH EVERY  OTHER DAY 05/03/22   Croitoru, Mihai, MD  Multiple Vitamin (MULTIVITAMIN) tablet Take 1 tablet by mouth daily.    [provider]  promethazine -dextromethorphan (PROMETHAZINE -DM) 6.25-15 MG/5ML syrup Take 5 mLs by mouth at bedtime as needed for cough. Do not take with alcohol or while driving or operating heavy machinery.  May cause drowsiness. 07/04/23   Chandra Harlene LABOR, NP  rosuvastatin (CRESTOR) 5 MG tablet Take 5 mg by mouth daily.    [provider]  XELJANZ  XR 11 MG TB24 TAKE 1  TABLET BY MOUTH DAILY 04/30/23   Dolphus Reiter, MD    Family History Family History  Problem Relation Age of Onset   Ovarian cancer Mother    Cancer Mother        colon    Diabetes Father    Heart disease Father    Healthy Sister    Healthy Daughter    Healthy Daughter     Social History Social History   Tobacco Use   Smoking status: Former    Current packs/day: 0.00    Types: Cigarettes    Quit date: 03/05/2007    Years since quitting: 16.3    Passive exposure: Never   Smokeless tobacco: Never   Tobacco comments:    recreational   Vaping Use   Vaping status: Never Used  Substance Use Topics   Alcohol use: Yes    Comment: rarely   Drug use: Never     Allergies   Patient has no known allergies.   Review of Systems Review of Systems Per HPI  Physical Exam Triage Vital Signs ED Triage Vitals  Encounter Vitals Group     BP 07/04/23 1326 (!) 144/74     Systolic BP Percentile --      Diastolic BP Percentile --      Pulse Rate 07/04/23 1326 76     Resp 07/04/23 1326 16     Temp 07/04/23 1326 99.6 F (37.6 C)     Temp Source 07/04/23 1326 Oral     SpO2 07/04/23 1326 93 %     Weight --      Height --      Head Circumference --      Peak Flow --      Pain Score 07/04/23 1329 8     Pain Loc --      Pain Education --      Exclude from Growth Chart --    No data found.  Updated Vital Signs BP (!) 144/74 (BP Location: Right Arm)   Pulse 76   Temp 99.6 F (37.6 C) (Oral)   Resp 16   SpO2 93%   Visual Acuity Right Eye Distance:   Left Eye Distance:   Bilateral Distance:    Right Eye Near:   Left Eye Near:    Bilateral Near:     Physical Exam Vitals and nursing note reviewed.  Constitutional:      General: He is not in acute distress.    Appearance: Normal appearance. He is ill-appearing. He is not toxic-appearing.  HENT:     Head: Normocephalic and atraumatic.     Right Ear: Tympanic membrane, ear canal and external ear normal.     Left  Ear: Tympanic membrane, ear canal and external ear normal.     Nose: No congestion or rhinorrhea.     Mouth/Throat:     Mouth: Mucous membranes are moist.     Pharynx: Oropharynx is clear. No oropharyngeal exudate  or posterior oropharyngeal erythema.  Eyes:     General: No scleral icterus.    Extraocular Movements: Extraocular movements intact.  Cardiovascular:     Rate and Rhythm: Normal rate and regular rhythm.  Pulmonary:     Effort: Pulmonary effort is normal. No respiratory distress.     Breath sounds: Normal breath sounds. No wheezing, rhonchi or rales.  Abdominal:     General: Abdomen is flat. Bowel sounds are normal. There is no distension.     Palpations: Abdomen is soft.     Tenderness: There is no abdominal tenderness. There is no guarding or rebound.  Musculoskeletal:     Cervical back: Normal range of motion and neck supple.  Lymphadenopathy:     Cervical: No cervical adenopathy.  Skin:    General: Skin is warm and dry.     Coloration: Skin is not jaundiced or pale.     Findings: No erythema or rash.  Neurological:     Mental Status: He is alert and oriented to person, place, and time.  Psychiatric:        Behavior: Behavior is cooperative.      UC Treatments / Results  Labs (all labs ordered are listed, but only abnormal results are displayed) Labs Reviewed  POC COVID19/FLU A&B COMBO - Normal    EKG   Radiology No results found.  Procedures Procedures (including critical care time)  Medications Ordered in UC Medications - No data to display  Initial Impression / Assessment and Plan / UC Course  I have reviewed the triage vital signs and the nursing notes.  Pertinent labs & imaging results that were available during my care of the patient were reviewed by me and considered in my medical decision making (see chart for details).   Patient is well-appearing, normotensive, afebrile, not tachycardic, not tachypneic, oxygenating well on room air.     1. Flu-like symptoms Vitals and exam are reassuring today Symptoms are consistent with viral illness, suspect influenza COVID-19 and influenza testing is negative today Given symptoms are consistent with influenza, will treat with Tamiflu  twice daily for 5 days, especially as patient is immunocompromised Supportive care discussed, start cough suppressant medication ER and return precautions discussed Work excuse provided  The patient was given the opportunity to ask questions.  All questions answered to their satisfaction.  The patient is in agreement to this plan.   Final Clinical Impressions(s) / UC Diagnoses   Final diagnoses:  Flu-like symptoms     Discharge Instructions      You have a viral upper respiratory infection.  COVID-19 and influenza test today is negative, however I suspect you have influenza.  Take the Tamiflu  as prescribed to treat it.  Symptoms should improve over the next week to 10 days.  If you develop chest pain or shortness of breath, go to the emergency room.  Some things that can make you feel better are: - Increased rest - Increasing fluid with water/sugar free electrolytes - Acetaminophen  and ibuprofen as needed for fever/pain - Salt water gargling, chloraseptic spray and throat lozenges - OTC guaifenesin (Mucinex) 600 mg twice daily for congestion - Saline sinus flushes or a neti pot - Humidifying the air -Tessalon  Perles every 8 hours as needed for dry cough and cough syrup at night time as needed     ED Prescriptions     Medication Sig Dispense Auth. Provider   benzonatate  (TESSALON ) 100 MG capsule Take 1 capsule (100 mg total) by mouth 3 (three)  times daily as needed for cough. Do not take with alcohol or while driving or operating heavy machinery.  May cause drowsiness. 21 capsule Chandra Raisin A, NP   promethazine -dextromethorphan (PROMETHAZINE -DM) 6.25-15 MG/5ML syrup Take 5 mLs by mouth at bedtime as needed for cough. Do not take with  alcohol or while driving or operating heavy machinery.  May cause drowsiness. 118 mL Chandra Raisin A, NP   oseltamivir  (TAMIFLU ) 75 MG capsule Take 1 capsule (75 mg total) by mouth every 12 (twelve) hours for 5 days. 10 capsule Chandra Raisin LABOR, NP      PDMP not reviewed this encounter.   Chandra Raisin LABOR, NP 07/04/23 [phone]

## 2023-07-04 NOTE — Discharge Instructions (Signed)
 You have a viral upper respiratory infection.  COVID-19 and influenza test today is negative, however I suspect you have influenza.  Take the Tamiflu  as prescribed to treat it.  Symptoms should improve over the next week to 10 days.  If you develop chest pain or shortness of breath, go to the emergency room.  Some things that can make you feel better are: - Increased rest - Increasing fluid with water/sugar free electrolytes - Acetaminophen  and ibuprofen as needed for fever/pain - Salt water gargling, chloraseptic spray and throat lozenges - OTC guaifenesin (Mucinex) 600 mg twice daily for congestion - Saline sinus flushes or a neti pot - Humidifying the air -Tessalon  Perles every 8 hours as needed for dry cough and cough syrup at night time as needed

## 2023-07-18 ENCOUNTER — Other Ambulatory Visit: Payer: Self-pay | Admitting: Rheumatology

## 2023-07-18 NOTE — Telephone Encounter (Signed)
 Last Fill: 04/30/2023  Labs: 03/28/2023 CBC/CMP WNL  TB Gold: 06/01/2022 Neg    Next Visit: 08/21/2023  Last Visit: 03/04/2023  ZO:[id number] arthritis involving multiple sites with positive rheumatoid factor   Current Dose per office note 03/04/2023: Harriette Ohara XR 11 mg by mouth daily   Left message to advise patient   Okay to refill Harriette Ohara?

## 2023-07-25 DIAGNOSIS — C44519 Basal cell carcinoma of skin of other part of trunk: Secondary | ICD-10-CM | POA: Diagnosis not present

## 2023-08-01 ENCOUNTER — Telehealth: Payer: Self-pay | Admitting: *Deleted

## 2023-08-01 NOTE — Telephone Encounter (Signed)
  Procedure: COLONOSCOPY  Height: 6'2 Weight: 246LBS        Have you had a colonoscopy before?  04/04/18, Dr. Darrick Penna  Do you have family history of colon cancer?  Yes mother  Do you have a family history of polyps? Yes mother  Previous colonoscopy with polyps removed? no  Do you have a history colorectal cancer?   no  Are you diabetic?  no  Do you have a prosthetic or mechanical heart valve? no  Do you have a pacemaker/defibrillator?   no  Have you had endocarditis/atrial fibrillation?  no  Do you use supplemental oxygen/CPAP?  yes cpap  Have you had joint replacement within the last 12 months?  no  Do you tend to be constipated or have to use laxatives?  no   Do you have history of alcohol use? If yes, how much and how often.  Yes, light to moderate  Do you have history or are you using drugs? If yes, what do are you  using?  no  Have you ever had a stroke/heart attack?  no  Have you ever had a heart or other vascular stent placed,?no  Do you take weight loss medication? no  Do you take any blood-thinning medications such as: (Plavix, aspirin, Coumadin, Aggrenox, Brilinta, Xarelto, Eliquis, Pradaxa, Savaysa or Effient)? ASPIRIN 81MG   If yes we need the name, milligram, dosage and who is prescribing doctor:  n             Current Outpatient Medications  Medication Sig Dispense Refill   amLODipine-olmesartan (AZOR) 5-20 MG tablet TAKE 1 TABLET BY MOUTH DAILY 30 tablet 11   aspirin 81 MG EC tablet 1 tablet     hydrochlorothiazide (MICROZIDE) 12.5 MG capsule TAKE 1 CAPSULE BY MOUTH EVERY  OTHER DAY 90 capsule 0   Multiple Vitamin (MULTIVITAMIN) tablet Take 1 tablet by mouth daily.     rosuvastatin (CRESTOR) 5 MG tablet Take 5 mg by mouth daily.     XELJANZ XR 11 MG TB24 TAKE 1 TABLET BY MOUTH DAILY 30 tablet 0   No current facility-administered medications for this visit.    No Known Allergies

## 2023-08-10 ENCOUNTER — Other Ambulatory Visit: Payer: Self-pay | Admitting: Rheumatology

## 2023-08-12 NOTE — Telephone Encounter (Signed)
[facility] labs are due. 

## 2023-08-12 NOTE — Telephone Encounter (Signed)
 Please call the patient to come in for labs.

## 2023-08-12 NOTE — Telephone Encounter (Signed)
 Last Fill: 07/18/2023  Labs: 03/28/2023 CBC/CMP WNL [facility] labs are past due.  TB Gold: 06/01/2022 Neg    Next Visit: 08/21/2023  Last Visit: 03/04/2023  ZO:[id number] arthritis involving multiple sites with positive rheumatoid factor   Current Dose per office note 03/04/2023: Harriette Ohara XR 11 mg by mouth daily.   Okay to refill Harriette Ohara?

## 2023-08-14 NOTE — Progress Notes (Signed)
 Office Visit Note  Patient: Bobby Kramer             Date of Birth: 28-Nov-1970           MRN: [id number]             PCP: Avis Epley, PA-C Referring: Avis Epley, [place]* Visit Date: 08/21/2023 Occupation: @GUAROCC @  Subjective:  No chief complaint on file.   History of Present Illness: Bobby Kramer is a 53 y.o. male with seropositive rheumatoid arthritis.  He returns today after his last visit in October 2024.  He states he had a good response to cortisone injections to his shoulders.  He is also got a new mattress Topper which has been helpful.  For the last 3 months he has been having ongoing discomfort in his cervical region.  He states massaging the area helps.  He feels the pain in the morning and also towards the end of the day.  He is on the computer for several hours a day.  He does not recall any injury to his cervical spine.  He has occasional discomfort in his knee joints which he relates to playing basketball.  No episodes of plantar fasciitis in many years.    Activities of Daily Living:  Patient reports morning stiffness for 2-3 minutes.   Patient Denies nocturnal pain.  Difficulty dressing/grooming: Denies Difficulty climbing stairs: Denies Difficulty getting out of chair: Denies Difficulty using hands for taps, buttons, cutlery, and/or writing: Denies  Review of Systems  Constitutional:  Negative for fatigue.  HENT:  Negative for mouth sores and mouth dryness.   Eyes:  Negative for dryness.  Respiratory:  Negative for shortness of breath and difficulty breathing.   Cardiovascular:  Negative for chest pain and palpitations.  Gastrointestinal:  Negative for blood in stool, constipation and diarrhea.  Endocrine: Negative for increased urination.  Genitourinary:  Negative for involuntary urination.  Musculoskeletal:  Positive for joint pain, joint pain and morning stiffness. Negative for gait problem, joint swelling, myalgias, muscle weakness, muscle  tenderness and myalgias.  Skin:  Negative for color change, rash, hair loss and sensitivity to sunlight.  Allergic/Immunologic: Negative for susceptible to infections.  Neurological:  Negative for dizziness and headaches.  Hematological:  Negative for swollen glands.  Psychiatric/Behavioral:  Negative for depressed mood and sleep disturbance. The patient is not nervous/anxious.     PMFS History:  Patient Active Problem List   Diagnosis Date Noted   Special screening for malignant neoplasms, colon    Pain in right hip 06/20/2016   High risk medication use 05/31/2016   History of hypertension 05/31/2016   History of sleep apnea 05/31/2016   HLA B27 (HLA B27 positive) 05/31/2016   Trochanteric bursitis of both hips 05/31/2016   OSA (obstructive sleep apnea) 01/08/2016   Overweight 01/08/2016   Hyperlipidemia 01/08/2016   Rheumatoid arthritis (HCC) 01/08/2016   Cervicalgia 11/13/2013   Sprain of neck 11/13/2013   HTN (hypertension) 07/28/2013   Mild obesity 07/28/2013    Past Medical History:  Diagnosis Date   Basal cell carcinoma 06/2023   Hypertension    Rheumatoid arthritis (HCC)    Seasonal allergies    Sleep apnea    uses a cpap    Family History  Problem Relation Age of Onset   Ovarian cancer Mother    Cancer Mother        colon    Diabetes Father    Heart disease Father    Healthy  Sister    Healthy Daughter    Healthy Daughter    Past Surgical History:  Procedure Laterality Date   BASAL CELL CARCINOMA EXCISION  06/2023   lower back   CARDIAC CATHETERIZATION  05/09/2012   EF 55% Normal Coronary Arteries   COLONOSCOPY N/A 04/04/2018   Procedure: COLONOSCOPY;  Surgeon: West Bali, MD;  Location: [facility];  Service: Endoscopy;  Laterality: N/A;  1:00 - pt knows to arrive at 10:30   DOPPLER ECHOCARDIOGRAPHY  09/18/2011   EF>55%   EXCISION MORTON'S NEUROMA  03/06/2012   Procedure: EXCISION MORTON'S NEUROMA;  Surgeon: Toni Arthurs, MD;  Location: [facility];  Service: Orthopedics;  Laterality: Right;  EXCISION OF RIGHT THIRD WEB SPACE NEUROMA  HENDER RETRACTOR *ESMARK USED AS TOURNIQUET.  ON AT 1610.  OFF AT 0826.   GXT  09/18/2011   LEFT HEART CATHETERIZATION WITH CORONARY ANGIOGRAM Bilateral 05/26/2012   Procedure: LEFT HEART CATHETERIZATION WITH CORONARY ANGIOGRAM;  Surgeon: Thurmon Fair, MD;  Location: [facility];  Service: Cardiovascular;  Laterality: Bilateral;   SYNOVECTOMY  03/06/2012   Procedure: SYNOVECTOMY;  Surgeon: Toni Arthurs, MD;  Location: [facility];  Service: Orthopedics;  Laterality: Right;  SYNOVECTOMY OF THIRD AND FOURTH METATARSAL PHALANGEAL JOINT   WISDOM TOOTH EXTRACTION     Social History   Social History Narrative   Not on file   Immunization History  Administered Date(s) Administered   Influenza-Unspecified 02/26/2015, 03/28/2018   Janssen (J&J) SARS-COV-2 Vaccination 11/06/2019   Moderna Sars-Covid-2 Vaccination 04/27/2020     Objective: Vital Signs: BP 137/79 (BP Location: Left Arm, Patient Position: Sitting, Cuff Size: Large)   Pulse 61   Resp 15   Ht 6\' 2"  (1.88 m)   Wt 245 lb (111.1 kg)   BMI 31.46 kg/m    Physical Exam Vitals and nursing note reviewed.  Constitutional:      Appearance: He is well-developed.  HENT:     Head: Normocephalic and atraumatic.  Eyes:     Conjunctiva/sclera: Conjunctivae normal.     Pupils: Pupils are equal, round, and reactive to light.  Cardiovascular:     Rate and Rhythm: Normal rate and regular rhythm.     Heart sounds: Normal heart sounds.  Pulmonary:     Effort: Pulmonary effort is normal.     Breath sounds: Normal breath sounds.  Abdominal:     General: Bowel sounds are normal.     Palpations: Abdomen is soft.  Musculoskeletal:     Cervical back: Normal range of motion and neck supple.  Skin:    General: Skin is warm and dry.     Capillary Refill: Capillary refill takes less than 2 seconds.  Neurological:      Mental Status: He is alert and oriented to person, place, and time.  Psychiatric:        Behavior: Behavior normal.      Musculoskeletal Exam: He had good range of motion of the cervical spine.  He complains of discomfort over the C7 region.  Thoracic and lumbar spine were in good range of motion without any discomfort.  Shoulder joints, elbow joints, wrist joints, MCPs PIPs and DIPs with good range of motion without any synovitis.  Hip joints and knee joints in good range of motion without any warmth swelling or effusion.  There was no tenderness over ankles or MTPs.  There was no plantar fasciitis.  CDAI Exam: CDAI Score: -- Patient Global: --; Provider Global: --  Swollen: --; Tender: -- Joint Exam 08/21/2023   No joint exam has been documented for this visit   There is currently no information documented on the homunculus. Go to the Rheumatology activity and complete the homunculus joint exam.  Investigation: No additional findings.  Imaging: No results found.  Recent Labs: Lab Results  Component Value Date   WBC 4.9 06/01/2022   HGB 14.7 06/01/2022   PLT 225 06/01/2022   NA 138 06/01/2022   K 4.1 06/01/2022   CL 107 06/01/2022   CO2 26 06/01/2022   GLUCOSE 108 (H) 06/01/2022   BUN 16 06/01/2022   CREATININE 1.11 06/01/2022   BILITOT 0.5 06/01/2022   ALKPHOS 54 12/11/2016   AST 24 06/01/2022   ALT 38 06/01/2022   PROT 7.2 06/01/2022   ALBUMIN 4.7 12/11/2016   CALCIUM 8.9 06/01/2022   GFRAA >89 12/11/2016   QFTBGOLDPLUS NEGATIVE 06/01/2022    Speciality Comments: Last Labs: 03/28/2023  Procedures:  No procedures performed Allergies: Patient has no known allergies.   Assessment / Plan:     Visit Diagnoses: Rheumatoid arthritis involving multiple sites with positive rheumatoid factor (HCC)-he has been doing well without any flares.  He had good response to bilateral shoulder joint injection.  He could range of motion in all of his joints today.  He has been  having some discomfort in the cervical region which she states gets better with the massage.  He has been taking Xeljanz 11 mg XR daily without any interruption.  He denies any side effects from the medications.  He has been going to the gym on a regular basis and plays basketball.  High risk medication use - Xeljanz XR 11 mg p.o. daily. -CBC and CMP were normal in October 2024.  He has not had labs since then.  Getting labs every 3 months was emphasized.  Will check labs today.  TB Gold was negative on June 01, 2022.  Will check TB Gold today.  Plan: CBC with Differential/Platelet, COMPLETE METABOLIC PANEL WITH GFR, QuantiFERON-TB Gold Plus.  Information on immunization was placed in the AVS.  He was advised to hold Harriette Ohara if he develops an infection and resume after the infection resolves.  Increased risk of skin cancer with Harriette Ohara was also discussed.  He has been getting annual skin examination.  Use of sunscreen and sun protection was discussed.  Blackbox warning regarding increased risk of major adverse cardiovascular events, thrombosis, pulmonary embolism were also reviewed and information was placed in the AVS.  HLA B27 (HLA B27 positive)  Neck pain -he has been having pain and discomfort in his cervical region for the last 3 months.  He states that in discomfort gets better after massage.  He notices discomfort mostly in the morning and towards the end of the day.  He is on the computer for a long time and also goes to the gym.  He does not recall any injury.  He denies any radiculopathy.  Plan: XR Cervical Spine 2 or 3 views.  X-rays show C6-C7 narrowing and facet joint arthropathy.  Neck exercises were demonstrated in the office and a handout was given.  Patient declined physical therapy.  Chronic pain of both shoulders -he had good range of motion in his shoulders today.  He responded to bilateral subacromial bursa injection May 2024.  Crepitus of both knee joints-he had no crepitus on the  examination today.  He notices some discomfort he plays basketball.  Mixed hyperlipidemia-March 27, 2023 LDL was 79.  He was advised to get lipid panel every 6 months while he is on [place].  He is on Crestor 5 mg p.o. daily.  He also takes aspirin 81 mg p.o. daily.  History of hypertension-blood pressure was normal today at 137/79.  Increased risk of heart disease with rheumatoid arthritis was discussed.  Dietary modifications were emphasized.  He is on amlodipine-olmesartan and HCTZ 12.5 mg p.o. daily.  OSA (obstructive sleep apnea)  Orders: Orders Placed This Encounter  Procedures   XR Cervical Spine 2 or 3 views   CBC with Differential/Platelet   COMPLETE METABOLIC PANEL WITH GFR   QuantiFERON-TB Gold Plus   No orders of the defined types were placed in this encounter.   Follow-Up Instructions: Return in about 5 months (around 01/21/2024) for Rheumatoid arthritis.   Pollyann Savoy, MD  Note - This record has been created using Animal nutritionist.  Chart creation errors have been sought, but may not always  have been located. Such creation errors do not reflect on  the standard of medical care.

## 2023-08-21 ENCOUNTER — Encounter: Payer: Self-pay | Admitting: Rheumatology

## 2023-08-21 ENCOUNTER — Ambulatory Visit: Payer: BC Managed Care – PPO | Attending: Rheumatology | Admitting: Rheumatology

## 2023-08-21 ENCOUNTER — Ambulatory Visit (INDEPENDENT_AMBULATORY_CARE_PROVIDER_SITE_OTHER)

## 2023-08-21 VITALS — BP 137/79 | HR 61 | Resp 15 | Ht 74.0 in | Wt 245.0 lb

## 2023-08-21 DIAGNOSIS — M7061 Trochanteric bursitis, right hip: Secondary | ICD-10-CM

## 2023-08-21 DIAGNOSIS — M0579 Rheumatoid arthritis with rheumatoid factor of multiple sites without organ or systems involvement: Secondary | ICD-10-CM

## 2023-08-21 DIAGNOSIS — G4733 Obstructive sleep apnea (adult) (pediatric): Secondary | ICD-10-CM

## 2023-08-21 DIAGNOSIS — M542 Cervicalgia: Secondary | ICD-10-CM | POA: Diagnosis not present

## 2023-08-21 DIAGNOSIS — M238X1 Other internal derangements of right knee: Secondary | ICD-10-CM

## 2023-08-21 DIAGNOSIS — Z79899 Other long term (current) drug therapy: Secondary | ICD-10-CM

## 2023-08-21 DIAGNOSIS — G8929 Other chronic pain: Secondary | ICD-10-CM

## 2023-08-21 DIAGNOSIS — Z1589 Genetic susceptibility to other disease: Secondary | ICD-10-CM | POA: Diagnosis not present

## 2023-08-21 DIAGNOSIS — Z8679 Personal history of other diseases of the circulatory system: Secondary | ICD-10-CM

## 2023-08-21 DIAGNOSIS — M25511 Pain in right shoulder: Secondary | ICD-10-CM

## 2023-08-21 DIAGNOSIS — E782 Mixed hyperlipidemia: Secondary | ICD-10-CM

## 2023-08-21 DIAGNOSIS — M722 Plantar fascial fibromatosis: Secondary | ICD-10-CM

## 2023-08-21 DIAGNOSIS — M25512 Pain in left shoulder: Secondary | ICD-10-CM

## 2023-08-21 DIAGNOSIS — M238X2 Other internal derangements of left knee: Secondary | ICD-10-CM

## 2023-08-21 NOTE — Patient Instructions (Addendum)
 Standing Labs We placed an order today for your standing lab work.   Please have your standing labs drawn in June  and every 3 months  Please get fasting lipid level with your next labs   Please have your labs drawn 2 weeks prior to your appointment so that the provider can discuss your lab results at your appointment, if possible.  Please note that you may see your imaging and lab results in MyChart before we have reviewed them. We will contact you once all results are reviewed. Please allow our office up to 72 hours to thoroughly review all of the results before contacting the office for clarification of your results.  WALK-IN LAB HOURS  Monday through Thursday from 8:00 am -12:30 pm and 1:00 pm-5:00 pm and Friday from 8:00 am-12:00 pm.  Patients with office visits requiring labs will be seen before walk-in labs.  You may encounter longer than normal wait times. Please allow additional time. Wait times may be shorter on  Monday and Thursday afternoons.  We do not book appointments for walk-in labs. We appreciate your patience and understanding with our staff.   Labs are drawn by [facility]. Please bring your co-pay at the time of your lab draw.  You may receive a bill from [facility] for your lab work.  Please note if you are on Hydroxychloroquine and and an order has been placed for a Hydroxychloroquine level,  you will need to have it drawn 4 hours or more after your last dose.  If you wish to have your labs drawn at another location, please call the office 24 hours in advance so we can fax the orders.  The office is located at [address], [place], [place] [address]   If you have any questions regarding directions or hours of operation,  please call [phone].   As a reminder, please drink plenty of water prior to coming for your lab work. Thanks!   Vaccines You are taking a medication(s) that can suppress your immune system.  The following immunizations are  recommended: Flu annually Covid-19  RSV Td/Tdap (tetanus, diphtheria, pertussis) every 10 years Pneumonia (Prevnar 15 then Pneumovax 23 at least 1 year apart.  Alternatively, can take Prevnar 20 without needing additional dose) Shingrix: 2 doses from 4 weeks to 6 months apart  Please check with your PCP to make sure you are up to date.   If you have signs or symptoms of an infection or start antibiotics: First, call your PCP for workup of your infection. Hold your medication through the infection, until you complete your antibiotics, and until symptoms resolve if you take the following: Injectable medication (Actemra, Benlysta, Cimzia, Cosentyx, Enbrel, Humira, Kevzara, Orencia, Remicade, Simponi, Stelara, Taltz, Tremfya) Methotrexate Leflunomide (Arava) Mycophenolate (Cellcept) [place]borne [place], or Rinvoq   Because you are taking Harriette Ohara, Rinvoq, or Olumiant, it is very important to know that this class of medications has a FDA BLACK BOX WARNING for major adverse cardiovascular events (MACE), thrombosis, mortality (including sudden cardiovascular death), serious infections, and lymphomas. MACE is defined as cardiovascular death, myocardial infarction, and stroke. Thrombosis includes deep venous thrombosis (DVT), pulmonary embolism (PE), and arterial thrombosis. If you are a current or former smoker, you are at higher risk for MACE.   Heart Disease Prevention   Your inflammatory disease increases your risk of heart disease which includes heart attack, stroke, atrial fibrillation (irregular heartbeats), high blood pressure, heart failure and atherosclerosis (plaque in the arteries).  It is important to  reduce your risk by:   Keep blood pressure, cholesterol, and blood sugar at healthy levels   Smoking Cessation   Maintain a healthy weight  BMI 20-25   Eat a healthy diet  Plenty of fresh fruit, vegetables, and whole grains  Limit saturated fats, foods high in sodium, and added sugars   DASH and Mediterranean diet   Increase physical activity  Recommend moderate physically activity for 150 minutes per week/ 30 minutes a day for five days a week These can be broken up into three separate ten-minute sessions during the day.   Reduce Stress  Meditation, slow breathing exercises, yoga, coloring books  Dental visits twice a year    Cervical Strain and Sprain Rehab Ask your health care provider which exercises are safe for you. Do exercises exactly as told by your health care provider and adjust them as directed. It is normal to feel mild stretching, pulling, tightness, or discomfort as you do these exercises. Stop right away if you feel sudden pain or your pain gets worse. Do not begin these exercises until told by your health care provider. Stretching and range-of-motion exercises Cervical side bending  Using good posture, sit on a stable chair or stand up. Without moving your shoulders, slowly tilt your left / right ear to your shoulder until you feel a stretch in the neck muscles on the opposite side. You should be looking straight ahead. Hold for __________ seconds. Repeat with the other side of your neck. Repeat __________ times. Complete this exercise __________ times a day. Cervical rotation  Using good posture, sit on a stable chair or stand up. Slowly turn your head to the side as if you are looking over your left / right shoulder. Keep your eyes level with the ground. Stop when you feel a stretch along the side and the back of your neck. Hold for __________ seconds. Repeat this by turning to your other side. Repeat __________ times. Complete this exercise __________ times a day. Thoracic extension and pectoral stretch  Roll a towel or a small blanket so it is about 4 inches (10 cm) in diameter. Lie down on your back on a firm surface. Put the towel in the middle of your back across your spine. It should not be under your shoulder blades. Put your hands behind  your head and let your elbows fall out to your sides. Hold for __________ seconds. Repeat __________ times. Complete this exercise __________ times a day. Strengthening exercises Upper cervical flexion  Lie on your back with a thin pillow behind your head or a small, rolled-up towel under your neck. Gently tuck your chin toward your chest and nod your head down to look toward your feet. Do not lift your head off the pillow. Hold for __________ seconds. Release the tension slowly. Relax your neck muscles completely before you repeat this exercise. Repeat __________ times. Complete this exercise __________ times a day. Cervical extension  Stand about 6 inches (15 cm) away from a wall, with your back facing the wall. Place a soft object, about 6-8 inches (15-20 cm) in diameter, between the back of your head and the wall. A soft object could be a small pillow, a ball, or a folded towel. Gently tilt your head back and press into the soft object. Keep your jaw and forehead relaxed. Hold for __________ seconds. Release the tension slowly. Relax your neck muscles completely before you repeat this exercise. Repeat __________ times. Complete this exercise __________ times a day. Posture  and body mechanics Body mechanics refer to the movements and positions of your body while you do your daily activities. Posture is part of body mechanics. Good posture and healthy body mechanics can help to relieve stress in your body's tissues and joints. Good posture means that your spine is in its natural S-curve position (your spine is neutral), your shoulders are pulled back slightly, and your head is not tipped forward. The following are general guidelines for using improved posture and body mechanics in your everyday activities. Sitting  When sitting, keep your spine neutral and keep your feet flat on the floor. Use a footrest, if needed, and keep your thighs parallel to the floor. Avoid rounding your shoulders.  Avoid tilting your head forward. When working at a desk or a computer, keep your desk at a height where your hands are slightly lower than your elbows. Slide your chair under your desk so you are close enough to maintain good posture. When working at a computer, place your monitor at a height where you are looking straight ahead and you do not have to tilt your head forward or downward to look at the screen. Standing  When standing, keep your spine neutral and keep your feet about hip-width apart. Keep a slight bend in your knees. Your ears, shoulders, and hips should line up. When you do a task in which you stand in one place for a long time, place one foot up on a stable object that is 2-4 inches (5-10 cm) high, such as a footstool. This helps keep your spine neutral. Resting When lying down and resting, avoid positions that are most painful for you. Try to support your neck in a neutral position. You can use a contour pillow or a small rolled-up towel. Your pillow should support your neck but not push on it. This information is not intended to replace advice given to you by your health care provider. Make sure you discuss any questions you have with your health care provider. Document Revised: 09/17/2022 Document Reviewed: 12/04/2021 Elsevier Patient Education  2024 [employer].

## 2023-08-22 NOTE — Progress Notes (Signed)
 CBC and CMP are normal.

## 2023-08-24 LAB — COMPREHENSIVE METABOLIC PANEL WITH GFR
AG Ratio: 1.8 (calc) (ref 1.0–2.5)
ALT: 23 U/L (ref 9–46)
AST: 19 U/L (ref 10–35)
Albumin: 4.3 g/dL (ref 3.6–5.1)
Alkaline phosphatase (APISO): 60 U/L (ref 35–144)
BUN: 17 mg/dL (ref 7–25)
CO2: 28 mmol/L (ref 20–32)
Calcium: 9 mg/dL (ref 8.6–10.3)
Chloride: 106 mmol/L (ref 98–110)
Creat: 1.11 mg/dL (ref 0.70–1.30)
Globulin: 2.4 g/dL (ref 1.9–3.7)
Glucose, Bld: 105 mg/dL — ABNORMAL HIGH (ref 65–99)
Potassium: 4.4 mmol/L (ref 3.5–5.3)
Sodium: 140 mmol/L (ref 135–146)
Total Bilirubin: 0.5 mg/dL (ref 0.2–1.2)
Total Protein: 6.7 g/dL (ref 6.1–8.1)
eGFR: 80 mL/min/{1.73_m2} (ref >=60)

## 2023-08-24 LAB — QUANTIFERON-TB GOLD PLUS
Mitogen-NIL: 8.22 [IU]/mL
NIL: 0.01 [IU]/mL
QuantiFERON-TB Gold Plus: NEGATIVE
TB1-NIL: 0.01 [IU]/mL
TB2-NIL: 0 [IU]/mL

## 2023-08-24 LAB — CBC WITH DIFFERENTIAL/PLATELET
Absolute Lymphocytes: 956 {cells}/uL (ref 850–3900)
Absolute Monocytes: 484 {cells}/uL (ref 200–950)
Basophils Absolute: 12 {cells}/uL (ref 0–200)
Basophils Relative: 0.3 %
Eosinophils Absolute: 62 {cells}/uL (ref 15–500)
Eosinophils Relative: 1.6 %
HCT: 41.1 % (ref 38.5–50.0)
Hemoglobin: 14.1 g/dL (ref 13.2–17.1)
MCH: 31.1 pg (ref 27.0–33.0)
MCHC: 34.3 g/dL (ref 32.0–36.0)
MCV: 90.7 fL (ref 80.0–100.0)
MPV: 9.6 fL (ref 7.5–12.5)
Monocytes Relative: 12.4 %
Neutro Abs: 2387 {cells}/uL (ref 1500–7800)
Neutrophils Relative %: 61.2 %
Platelets: 228 10*3/uL (ref 140–400)
RBC: 4.53 10*6/uL (ref 4.20–5.80)
RDW: 12.2 % (ref 11.0–15.0)
Total Lymphocyte: 24.5 %
WBC: 3.9 10*3/uL (ref 3.8–10.8)

## 2023-08-25 NOTE — Progress Notes (Signed)
 TB Gold is negative.

## 2023-09-18 ENCOUNTER — Other Ambulatory Visit: Payer: Self-pay | Admitting: Rheumatology

## 2023-09-18 NOTE — Telephone Encounter (Signed)
 Last Fill: 08/12/2023 (30 day supply)  Labs: 08/21/2023 CBC and CMP are normal.   TB Gold: 08/21/2023 Neg    Next Visit: 01/22/2024  Last Visit: 08/21/2023  DX: Rheumatoid arthritis involving multiple sites with positive rheumatoid factor   Current Dose per office note 08/21/2023: Xeljanz  XR 11 mg p.o. daily   Okay to refill Xeljanz ?

## 2023-09-25 NOTE — Telephone Encounter (Signed)
ASA 2. BMP pre-op.

## 2023-09-25 NOTE — Telephone Encounter (Signed)
 LMOVM to return call.

## 2023-10-09 ENCOUNTER — Encounter: Payer: Self-pay | Admitting: *Deleted

## 2023-10-09 NOTE — Telephone Encounter (Signed)
 LMOVM to return call.. letter mailed

## 2023-10-24 DIAGNOSIS — I10 Essential (primary) hypertension: Secondary | ICD-10-CM | POA: Diagnosis not present

## 2023-10-24 DIAGNOSIS — G4733 Obstructive sleep apnea (adult) (pediatric): Secondary | ICD-10-CM | POA: Diagnosis not present

## 2023-10-24 DIAGNOSIS — R0982 Postnasal drip: Secondary | ICD-10-CM | POA: Diagnosis not present

## 2023-10-24 DIAGNOSIS — M059 Rheumatoid arthritis with rheumatoid factor, unspecified: Secondary | ICD-10-CM | POA: Diagnosis not present

## 2023-10-24 DIAGNOSIS — E6609 Other obesity due to excess calories: Secondary | ICD-10-CM | POA: Diagnosis not present

## 2023-10-24 DIAGNOSIS — Z1331 Encounter for screening for depression: Secondary | ICD-10-CM | POA: Diagnosis not present

## 2023-10-24 DIAGNOSIS — Z6831 Body mass index (BMI) 31.0-31.9, adult: Secondary | ICD-10-CM | POA: Diagnosis not present

## 2023-10-24 DIAGNOSIS — Z0001 Encounter for general adult medical examination with abnormal findings: Secondary | ICD-10-CM | POA: Diagnosis not present

## 2023-11-14 ENCOUNTER — Encounter: Payer: Self-pay | Admitting: *Deleted

## 2023-11-14 ENCOUNTER — Other Ambulatory Visit: Payer: Self-pay | Admitting: *Deleted

## 2023-11-14 MED ORDER — PEG 3350-KCL-NA BICARB-NACL 420 G PO SOLR
4000.0000 mL | Freq: Once | ORAL | 0 refills | Status: AC
Start: 2023-11-14 — End: 2023-11-14

## 2023-11-14 NOTE — Telephone Encounter (Signed)
 Pt has been scheduled for 12/13/23 with Dr.Carver. instructions mailed and prep sent to pharmacy. Pt states he will have lab done at his place of employment and will fax the results over prior to his procedure.

## 2023-11-19 NOTE — Telephone Encounter (Signed)
 Questionnaire from recall, no referral needed

## 2023-11-26 ENCOUNTER — Other Ambulatory Visit: Payer: Self-pay | Admitting: Physician Assistant

## 2023-11-26 NOTE — Telephone Encounter (Signed)
 Last Fill: 09/18/2023  Labs: 08/21/2023 CBC and CMP are normal.   TB Gold: 08/21/2023 Neg    Next Visit: 01/22/2024  Last Visit: 08/21/2023  IK:Myzlfjunpi arthritis involving multiple sites with positive rheumatoid factor   Current Dose per office note 08/21/2023: Xeljanz  XR 11 mg p.o. daily   Okay to refill Xeljanz ?

## 2023-11-27 ENCOUNTER — Telehealth: Payer: Self-pay | Admitting: Pharmacist

## 2023-11-27 NOTE — Telephone Encounter (Signed)
 Submitted a Prior Authorization RENEWAL request to [employer] for XELJANZ  via CoverMyMeds. Will update once we receive a response.  Key: [id number]

## 2023-12-02 NOTE — Telephone Encounter (Signed)
 Received notification from [employer] regarding a prior authorization for XELJANZ . Authorization has been APPROVED from 11/27/2023 to 11/26/2024. Approval letter sent to scan center.  Authorization # [id number]  Sherry Pennant, PharmD, MPH, BCPS, CPP Clinical Pharmacist (Rheumatology and Pulmonology)

## 2023-12-05 LAB — LAB REPORT - SCANNED: EGFR: 89

## 2023-12-13 ENCOUNTER — Encounter (HOSPITAL_COMMUNITY)
Admission: RE | Disposition: A | Discharge status: Home / Self Care | Payer: Self-pay | Source: Home / Self Care | Attending: Internal Medicine

## 2023-12-13 ENCOUNTER — Ambulatory Visit (HOSPITAL_COMMUNITY)
Admission: RE | Admit: 2023-12-13 | Discharge: 2023-12-13 | Disposition: A | Discharge status: Home / Self Care | Attending: Internal Medicine | Admitting: Internal Medicine

## 2023-12-13 ENCOUNTER — Ambulatory Visit (HOSPITAL_COMMUNITY): Admitting: Anesthesiology

## 2023-12-13 ENCOUNTER — Other Ambulatory Visit: Payer: Self-pay

## 2023-12-13 ENCOUNTER — Encounter (HOSPITAL_COMMUNITY): Payer: Self-pay | Admitting: Internal Medicine

## 2023-12-13 DIAGNOSIS — Z1211 Encounter for screening for malignant neoplasm of colon: Secondary | ICD-10-CM

## 2023-12-13 DIAGNOSIS — I10 Essential (primary) hypertension: Secondary | ICD-10-CM | POA: Insufficient documentation

## 2023-12-13 DIAGNOSIS — G4730 Sleep apnea, unspecified: Secondary | ICD-10-CM | POA: Diagnosis not present

## 2023-12-13 DIAGNOSIS — Z79899 Other long term (current) drug therapy: Secondary | ICD-10-CM | POA: Diagnosis not present

## 2023-12-13 DIAGNOSIS — Z87891 Personal history of nicotine dependence: Secondary | ICD-10-CM | POA: Insufficient documentation

## 2023-12-13 DIAGNOSIS — Z800 Family history of malignant neoplasm of digestive organs: Secondary | ICD-10-CM

## 2023-12-13 DIAGNOSIS — M069 Rheumatoid arthritis, unspecified: Secondary | ICD-10-CM | POA: Insufficient documentation

## 2023-12-13 SURGERY — COLONOSCOPY
Anesthesia: General

## 2023-12-13 MED ORDER — LACTATED RINGERS IV SOLN
INTRAVENOUS | Status: DC
  Administered 2023-12-13: 500 mL via INTRAVENOUS

## 2023-12-13 MED ORDER — PROPOFOL 500 MG/50ML IV EMUL
INTRAVENOUS | Status: DC | PRN
  Administered 2023-12-13: 150 ug/kg/min via INTRAVENOUS
  Administered 2023-12-13: 100 mg via INTRAVENOUS

## 2023-12-13 NOTE — Discharge Instructions (Addendum)
  Colonoscopy Discharge Instructions  Read the instructions outlined below and refer to this sheet in the next few weeks. These discharge instructions provide you with general information on caring for yourself after you leave the hospital. Your doctor may also give you specific instructions. While your treatment has been planned according to the most current medical practices available, unavoidable complications occasionally occur.   ACTIVITY You may resume your regular activity, but move at a slower pace for the next 24 hours.  Take frequent rest periods for the next 24 hours.  Walking will help get rid of the air and reduce the bloated feeling in your belly (abdomen).  No driving for 24 hours (because of the medicine (anesthesia) used during the test).   Do not sign any important legal documents or operate any machinery for 24 hours (because of the anesthesia used during the test).  NUTRITION Drink plenty of fluids.  You may resume your normal diet as instructed by your doctor.  Begin with a light meal and progress to your normal diet. Heavy or fried foods are harder to digest and may make you feel sick to your stomach (nauseated).  Avoid alcoholic beverages for 24 hours or as instructed.  MEDICATIONS You may resume your normal medications unless your doctor tells you otherwise.  WHAT YOU CAN EXPECT TODAY Some feelings of bloating in the abdomen.  Passage of more gas than usual.  Spotting of blood in your stool or on the toilet paper.  IF YOU HAD POLYPS REMOVED DURING THE COLONOSCOPY: No aspirin products for 7 days or as instructed.  No alcohol for 7 days or as instructed.  Eat a soft diet for the next 24 hours.  FINDING OUT THE RESULTS OF YOUR TEST Not all test results are available during your visit. If your test results are not back during the visit, make an appointment with your caregiver to find out the results. Do not assume everything is normal if you have not heard from your  caregiver or the medical facility. It is important for you to follow up on all of your test results.  SEEK IMMEDIATE MEDICAL ATTENTION IF: You have more than a spotting of blood in your stool.  Your belly is swollen (abdominal distention).  You are nauseated or vomiting.  You have a temperature over 101.  You have abdominal pain or discomfort that is severe or gets worse throughout the day.   Your colonoscopy was relatively unremarkable.  I did not find any polyps or evidence of colon cancer.  I recommend repeating colonoscopy in 5 years for colon cancer screening purposes.   Follow-up with GI as needed.   I hope you have a great rest of your week!  Hennie Duos. Marletta Lor, D.O. Gastroenterology and Hepatology [facility]

## 2023-12-13 NOTE — Anesthesia Postprocedure Evaluation (Signed)
 Anesthesia Post Note  Patient: Bobby Kramer  Procedure(s) Performed: COLONOSCOPY  Patient location during evaluation: Endoscopy Anesthesia Type: General Level of consciousness: awake and alert Pain management: pain level controlled Vital Signs Assessment: post-procedure vital signs reviewed and stable Respiratory status: spontaneous breathing, nonlabored ventilation and respiratory function stable Cardiovascular status: blood pressure returned to baseline and stable Postop Assessment: no apparent nausea or vomiting Anesthetic complications: no   There were no known notable events for this encounter.   Last Vitals:  Vitals:   12/13/23 1120 12/13/23 1127  BP: (!) 99/57 109/67  Pulse: 64   Resp: 19   Temp:    SpO2: 98%     Last Pain:  Vitals:   12/13/23 1117  TempSrc: Oral  PainSc:                  Carlin LITTIE Kawasaki

## 2023-12-13 NOTE — H&P (Signed)
 Primary Care Physician:  Leonce Lucie PARAS, PA-C Primary Gastroenterologist:  Dr. Cindie  Pre-Procedure History & Physical: HPI:  Bobby Kramer is a 53 y.o. male is here for a colonoscopy to be performed for high risk colon cancer screening purposes,  family history of colon cancer in mother (age <26)  Past Medical History:  Diagnosis Date   Basal cell carcinoma 06/2023   Hypertension    Rheumatoid arthritis (HCC)    Seasonal allergies    Sleep apnea    uses a cpap    Past Surgical History:  Procedure Laterality Date   BASAL CELL CARCINOMA EXCISION  06/2023   lower back   CARDIAC CATHETERIZATION  05/09/2012   EF 55% Normal Coronary Arteries   COLONOSCOPY N/A 04/04/2018   Procedure: COLONOSCOPY;  Surgeon: Harvey Margo CROME, MD;  Location: [facility];  Service: Endoscopy;  Laterality: N/A;  1:00 - pt knows to arrive at 10:30   DOPPLER ECHOCARDIOGRAPHY  09/18/2011   EF>55%   EXCISION MORTON'S NEUROMA  03/06/2012   Procedure: EXCISION MORTON'S NEUROMA;  Surgeon: Norleen Armor, MD;  Location: [facility];  Service: Orthopedics;  Laterality: Right;  EXCISION OF RIGHT THIRD WEB SPACE NEUROMA  HENDER RETRACTOR *ESMARK USED AS TOURNIQUET.  ON AT 9051.  OFF AT 0826.   GXT  09/18/2011   LEFT HEART CATHETERIZATION WITH CORONARY ANGIOGRAM Bilateral 05/26/2012   Procedure: LEFT HEART CATHETERIZATION WITH CORONARY ANGIOGRAM;  Surgeon: Jerel Balding, MD;  Location: [facility];  Service: Cardiovascular;  Laterality: Bilateral;   SYNOVECTOMY  03/06/2012   Procedure: SYNOVECTOMY;  Surgeon: Norleen Armor, MD;  Location:  SURGERY CENTER;  Service: Orthopedics;  Laterality: Right;  SYNOVECTOMY OF THIRD AND FOURTH METATARSAL PHALANGEAL JOINT   WISDOM TOOTH EXTRACTION      Prior to Admission medications   Medication Sig Start Date End Date Taking? Authorizing Provider  amLODipine -olmesartan  (AZOR ) 5-20 MG tablet TAKE 1 TABLET BY MOUTH DAILY 09/04/22  Yes Croitoru, Mihai, MD   aspirin  81 MG EC tablet 1 tablet   Yes [provider]  Multiple Vitamin (MULTIVITAMIN) tablet Take 1 tablet by mouth daily.   Yes [provider]  rosuvastatin (CRESTOR) 5 MG tablet Take 5 mg by mouth daily.   Yes [provider]  XELJANZ  XR 11 MG TB24 TAKE 1 TABLET BY MOUTH DAILY 11/26/23  Yes Deveshwar, Maya, MD  hydrochlorothiazide  (MICROZIDE ) 12.5 MG capsule TAKE 1 CAPSULE BY MOUTH EVERY  OTHER DAY 05/03/22   Croitoru, Jerel, MD    Allergies as of 11/14/2023   (No Known Allergies)    Family History  Problem Relation Age of Onset   Ovarian cancer Mother    Cancer Mother        colon    Diabetes Father    Heart disease Father    Healthy Sister    Healthy Daughter    Healthy Daughter     Social History   Socioeconomic History   Marital status: Married    Spouse name: Not on file   Number of children: Not on file   Years of education: Not on file   Highest education level: Not on file  Occupational History   Not on file  Tobacco Use   Smoking status: Former    Current packs/day: 0.00    Types: Cigarettes    Quit date: 03/05/2007    Years since quitting: 16.7    Passive exposure: Never   Smokeless tobacco: Never   Tobacco comments:  recreational   Vaping Use   Vaping status: Never Used  Substance and Sexual Activity   Alcohol use: Yes    Comment: occasional   Drug use: Never   Sexual activity: Not on file  Other Topics Concern   Not on file  Social History Narrative   Not on file   Social Drivers of Health   Financial Resource Strain: Not on file  Food Insecurity: Not on file  Transportation Needs: Not on file  Physical Activity: Not on file  Stress: Not on file  Social Connections: Not on file  Intimate Partner Violence: Not on file    Review of Systems: See HPI, otherwise negative ROS  Physical Exam: Vital signs in last 24 hours: Temp:  [98 F (36.7 C)] 98 F (36.7 C) (07/18 1020) Pulse Rate:  [62] 62 (07/18  1020) Resp:  [13] 13 (07/18 1020) BP: (151)/(77) 151/77 (07/18 1020) SpO2:  [96 %] 96 % (07/18 1020) Weight:  [111.1 kg] 111.1 kg (07/18 1020)   General:   Alert,  Well-developed, well-nourished, pleasant and cooperative in NAD Head:  Normocephalic and atraumatic. Eyes:  Sclera clear, no icterus.   Conjunctiva pink. Ears:  Normal auditory acuity. Nose:  No deformity, discharge,  or lesions. Mouth:  No deformity or lesions, dentition normal. Neck:  Supple; no masses or thyromegaly. Lungs:  Clear throughout to auscultation.   No wheezes, crackles, or rhonchi. No acute distress. Heart:  Regular rate and rhythm; no murmurs, clicks, rubs,  or gallops. Abdomen:  Soft, nontender and nondistended. No masses, hepatosplenomegaly or hernias noted. Normal bowel sounds, without guarding, and without rebound.   Msk:  Symmetrical without gross deformities. Normal posture. Extremities:  Without clubbing or edema. Neurologic:  Alert and  oriented x4;  grossly normal neurologically. Skin:  Intact without significant lesions or rashes. Cervical Nodes:  No significant cervical adenopathy. Psych:  Alert and cooperative. Normal mood and affect.  Impression/Plan: Bobby Kramer is here for a colonoscopy to be performed for high risk colon cancer screening purposes, family history of colon cancer in mother (age <60)  The risks of the procedure including infection, bleed, or perforation as well as benefits, limitations, alternatives and imponderables have been reviewed with the patient. Questions have been answered. All parties agreeable.

## 2023-12-13 NOTE — Anesthesia Preprocedure Evaluation (Signed)
 Anesthesia Evaluation  Patient identified by MRN, date of birth, ID band Patient awake    Reviewed: Allergy  & Precautions, H&P , NPO status , Patient's Chart, lab work & pertinent test results  History of Anesthesia Complications Negative for: history of anesthetic complications  Airway Mallampati: II  TM Distance: >3 FB Neck ROM: Full    Dental no notable dental hx. (+) Dental Advisory Given, Teeth Intact   Pulmonary sleep apnea and Continuous Positive Airway Pressure Ventilation , former smoker   Pulmonary exam normal breath sounds clear to auscultation       Cardiovascular hypertension, Pt. on medications Normal cardiovascular exam Rhythm:Regular Rate:Normal  '13 ECHO: normal LVF, normal valves '13 exercise stress test: no ischemia   Neuro/Psych negative neurological ROS     GI/Hepatic negative GI ROS, Neg liver ROS,,,  Endo/Other  negative endocrine ROS    Renal/GU negative Renal ROS     Musculoskeletal  (+) Arthritis , Rheumatoid disorders,    Abdominal   Peds  Hematology negative hematology ROS (+)   Anesthesia Other Findings   Reproductive/Obstetrics                              Anesthesia Physical Anesthesia Plan  ASA: 3  Anesthesia Plan: General   Post-op Pain Management: Minimal or no pain anticipated   Induction: Intravenous  PONV Risk Score and Plan: Propofol  infusion  Airway Management Planned: Natural Airway and Nasal Cannula  Additional Equipment: None  Intra-op Plan:   Post-operative Plan:   Informed Consent: I have reviewed the patients History and Physical, chart, labs and discussed the procedure including the risks, benefits and alternatives for the proposed anesthesia with the patient or authorized representative who has indicated his/her understanding and acceptance.     Dental advisory given  Plan Discussed with: CRNA  Anesthesia Plan Comments:           Anesthesia Quick Evaluation

## 2023-12-13 NOTE — Transfer of Care (Signed)
 Immediate Anesthesia Transfer of Care Note  Patient: [name]  Procedure(s) Performed: COLONOSCOPY  Patient Location: Endoscopy Unit  Anesthesia Type:General  Level of Consciousness: awake and alert   Airway & Oxygen  Therapy: Patient Spontanous Breathing  Post-op Assessment: Report given to RN and Post -op Vital signs reviewed and stable  Post vital signs: Reviewed and stable  Last Vitals:  Vitals Value Taken Time  BP 103/47 12/13/23 11:17  Temp 36.7 C 12/13/23 11:17  Pulse 56 12/13/23 11:17  Resp 14 12/13/23 11:17  SpO2 95 % 12/13/23 11:17    Last Pain:  Vitals:   12/13/23 1117  TempSrc: Oral  PainSc:       Patients Stated Pain Goal: 7 (12/13/23 1020)  Complications: No notable events documented.

## 2023-12-13 NOTE — Op Note (Addendum)
 [facility] Patient Name: Bobby Kramer Procedure Date: 12/13/2023 10:52 AM MRN: [id number] Date of Birth: July 24, 1970 Attending MD: Carlin POUR. Cindie HAS, [id number] CSN: [id number] Age: 53 Admit Type: Outpatient Procedure:                Colonoscopy Indications:              Screening in patient at increased risk: Colorectal                            cancer in mother before age 46 Providers:                Carlin POUR. Cindie, DO, Jon LABOR. Gerome RN, RN,                            Gordy Bruckner Tech, Technician Referring MD:              Medicines:                See the Anesthesia note for documentation of the                            administered medications Complications:            No immediate complications. Estimated Blood Loss:     Estimated blood loss: none. Procedure:                Pre-Anesthesia Assessment:                           - The anesthesia plan was to use monitored                            anesthesia care (MAC).                           After obtaining informed consent, the colonoscope                            was passed under direct vision. Throughout the                            procedure, the patient's blood pressure, pulse, and                            oxygen  saturations were monitored continuously. The                            [id number]-HQ190L ([id number]) scope was introduced through                            the anus and advanced to the the cecum, identified                            by appendiceal orifice and ileocecal valve. The                            colonoscopy  was performed without difficulty. The                            patient tolerated the procedure well. The quality                            of the bowel preparation was evaluated using the                            BBPS [facility] Bowel Preparation Scale) with scores                            of: Right Colon = 3, Transverse Colon = 3 and Left                            Colon =  3 (entire mucosa seen well with no residual                            staining, small fragments of stool or opaque                            liquid). The total BBPS score equals 9. Scope In: 11:02:22 AM Scope Out: 11:14:10 AM Scope Withdrawal Time: 0 hours 9 minutes 31 seconds  Total Procedure Duration: 0 hours 11 minutes 48 seconds  Findings:      The entire examined colon appeared normal. Impression:               - The entire examined colon is normal.                           - No specimens collected. Moderate Sedation:      Per Anesthesia Care Recommendation:           - Patient has a contact number available for                            emergencies. The signs and symptoms of potential                            delayed complications were discussed with the                            patient. Return to normal activities tomorrow.                            Written discharge instructions were provided to the                            patient.                           - Resume previous diet.                           - Continue present medications.                           -  Repeat colonoscopy in 5 years for screening                            purposes due to family history of colon cancer in                            mother age <31.                           - Return to GI clinic PRN. Procedure Code(s):        --- Professional ---                           H9894, Colorectal cancer screening; colonoscopy on                            individual at high risk Diagnosis Code(s):        --- Professional ---                           Z80.0, Family history of malignant neoplasm of                            digestive organs CPT copyright 2022 [employer]. All rights reserved. The codes documented in this report are preliminary and upon coder review may  be revised to meet current compliance requirements. Carlin POUR. Cindie, DO Carlin POUR. Cindie, DO 12/13/2023  11:16:47 AM This report has been signed electronically. Number of Addenda: 0

## 2023-12-16 ENCOUNTER — Encounter (HOSPITAL_COMMUNITY): Payer: Self-pay | Admitting: Internal Medicine

## 2023-12-25 DIAGNOSIS — L609 Nail disorder, unspecified: Secondary | ICD-10-CM | POA: Diagnosis not present

## 2023-12-25 DIAGNOSIS — D485 Neoplasm of uncertain behavior of skin: Secondary | ICD-10-CM | POA: Diagnosis not present

## 2023-12-25 DIAGNOSIS — L570 Actinic keratosis: Secondary | ICD-10-CM | POA: Diagnosis not present

## 2023-12-25 DIAGNOSIS — C44619 Basal cell carcinoma of skin of left upper limb, including shoulder: Secondary | ICD-10-CM | POA: Diagnosis not present

## 2024-01-08 NOTE — Progress Notes (Unsigned)
 Office Visit Note  Patient: Bobby Kramer             Date of Birth: [birth year]           MRN: [id number]             PCP: Leonce Lucie PARAS, PA-C Referring: Leonce Lucie PARAS, [place]* Visit Date: 01/22/2024 Occupation: @GUAROCC @  Subjective:  Medication monitoring   History of Present Illness: Bobby Kramer is a 53 y.o. male with history of seropositive rheumatoid arthritis.  Patient remains on xeljanz  11 mg XR by mouth daily.  He is tolerating Xeljanz  without any side effects and has not had any gaps in therapy.  He denies any signs or symptoms of a rheumatoid arthritis flare.  He denies any morning stiffness, nocturnal pain, or difficulty performing ADLs.  He has been exercising 3 to 4 days/week without difficulty.  He denies any recent or recurrent infections.  He denies any new medical conditions.    Activities of Daily Living:  Patient reports morning stiffness for 0 minute.   Patient Denies nocturnal pain.  Difficulty dressing/grooming: Denies Difficulty climbing stairs: Denies Difficulty getting out of chair: Denies Difficulty using hands for taps, buttons, cutlery, and/or writing: Denies  Review of Systems  Constitutional:  Negative for fatigue.  HENT:  Negative for mouth sores and mouth dryness.   Eyes:  Negative for dryness.  Respiratory:  Negative for shortness of breath.   Cardiovascular:  Negative for chest pain and palpitations.  Gastrointestinal:  Negative for blood in stool, constipation and diarrhea.  Endocrine: Negative for increased urination.  Genitourinary:  Negative for involuntary urination.  Musculoskeletal:  Negative for joint pain, gait problem, joint pain, joint swelling, myalgias, muscle weakness, morning stiffness, muscle tenderness and myalgias.  Skin:  Negative for color change, rash, hair loss and sensitivity to sunlight.  Allergic/Immunologic: Negative for susceptible to infections.  Neurological:  Negative for dizziness and headaches.   Hematological:  Negative for swollen glands.  Psychiatric/Behavioral:  Negative for depressed mood and sleep disturbance. The patient is not nervous/anxious.     PMFS History:  Patient Active Problem List   Diagnosis Date Noted   Special screening for malignant neoplasms, colon    Pain in right hip 06/20/2016   High risk medication use 05/31/2016   History of hypertension 05/31/2016   History of sleep apnea 05/31/2016   HLA B27 (HLA B27 positive) 05/31/2016   Trochanteric bursitis of both hips 05/31/2016   OSA (obstructive sleep apnea) 01/08/2016   Overweight 01/08/2016   Hyperlipidemia 01/08/2016   Rheumatoid arthritis (HCC) 01/08/2016   Cervicalgia 11/13/2013   Sprain of neck 11/13/2013   HTN (hypertension) 07/28/2013   Mild obesity 07/28/2013    Past Medical History:  Diagnosis Date   Basal cell carcinoma 06/2023   Hypertension    Rheumatoid arthritis (HCC)    Seasonal allergies    Sleep apnea    uses a cpap    Family History  Problem Relation Age of Onset   Ovarian cancer Mother    Cancer Mother        colon    Diabetes Father    Heart disease Father    Healthy Sister    Healthy Daughter    Healthy Daughter    Past Surgical History:  Procedure Laterality Date   BASAL CELL CARCINOMA EXCISION  06/2023   lower back   CARDIAC CATHETERIZATION  05/09/2012   EF 55% Normal Coronary Arteries   COLONOSCOPY N/A 04/04/2018  Procedure: COLONOSCOPY;  Surgeon: Harvey Margo CROME, MD;  Location: [facility];  Service: Endoscopy;  Laterality: N/A;  1:00 - pt knows to arrive at 10:30   COLONOSCOPY N/A 12/13/2023   Procedure: COLONOSCOPY;  Surgeon: Cindie Carlin POUR, DO;  Location: [facility];  Service: Endoscopy;  Laterality: N/A;  11:15 am, asa 2   DOPPLER ECHOCARDIOGRAPHY  09/18/2011   EF>55%   EXCISION MORTON'S NEUROMA  03/06/2012   Procedure: EXCISION MORTON'S NEUROMA;  Surgeon: Norleen Armor, MD;  Location: [facility];  Service: Orthopedics;   Laterality: Right;  EXCISION OF RIGHT THIRD WEB SPACE NEUROMA  HENDER RETRACTOR *ESMARK USED AS TOURNIQUET.  ON AT 9051.  OFF AT 0826.   GXT  09/18/2011   LEFT HEART CATHETERIZATION WITH CORONARY ANGIOGRAM Bilateral 05/26/2012   Procedure: LEFT HEART CATHETERIZATION WITH CORONARY ANGIOGRAM;  Surgeon: Jerel Balding, MD;  Location: [facility];  Service: Cardiovascular;  Laterality: Bilateral;   SYNOVECTOMY  03/06/2012   Procedure: SYNOVECTOMY;  Surgeon: Norleen Armor, MD;  Location: [facility];  Service: Orthopedics;  Laterality: Right;  SYNOVECTOMY OF THIRD AND FOURTH METATARSAL PHALANGEAL JOINT   WISDOM TOOTH EXTRACTION     Social History   Social History Narrative   Not on file   Immunization History  Administered Date(s) Administered   Influenza-Unspecified 02/26/2015, 03/28/2018   Janssen (J&J) SARS-COV-2 Vaccination 11/06/2019   Moderna Sars-Covid-2 Vaccination 04/27/2020     Objective: Vital Signs: BP (!) 136/90 (BP Location: Left Arm, Patient Position: Sitting, Cuff Size: Large)   Pulse (!) 57   Resp 14   Ht 6' 2 (1.88 m)   Wt 248 lb (112.5 kg)   BMI 31.84 kg/m    Physical Exam Vitals and nursing note reviewed.  Constitutional:      Appearance: He is well-developed.  HENT:     Head: Normocephalic and atraumatic.  Eyes:     Conjunctiva/sclera: Conjunctivae normal.     Pupils: Pupils are equal, round, and reactive to light.  Cardiovascular:     Rate and Rhythm: Normal rate and regular rhythm.     Heart sounds: Normal heart sounds.  Pulmonary:     Effort: Pulmonary effort is normal.     Breath sounds: Normal breath sounds.  Abdominal:     General: Bowel sounds are normal.     Palpations: Abdomen is soft.  Musculoskeletal:     Cervical back: Normal range of motion and neck supple.  Skin:    General: Skin is warm and dry.     Capillary Refill: Capillary refill takes less than 2 seconds.  Neurological:     Mental Status: He is alert and oriented  to person, place, and time.  Psychiatric:        Behavior: Behavior normal.      Musculoskeletal Exam: C-spine, thoracic spine, lumbar spine good range of motion.  No midline spinal tenderness.  No SI joint tenderness.  Shoulder joints, elbow joints, wrist joints, MCPs, PIPs, DIPs have good range of motion with no synovitis.  PIP and DIP prominence consistent with osteoarthritis of both hands.  Complete fist formation bilaterally.  Hip joints have good range of motion with no groin pain.  Knee joints have good range of motion no warmth or effusion.  Ankle joints have good range of motion with no tenderness or joint swelling.  CDAI Exam: CDAI Score: -- Patient Global: --; Provider Global: -- Swollen: --; Tender: -- Joint Exam 01/22/2024   No joint exam has  been documented for this visit   There is currently no information documented on the homunculus. Go to the Rheumatology activity and complete the homunculus joint exam.  Investigation: No additional findings.  Imaging: No results found.  Recent Labs: Lab Results  Component Value Date   WBC 3.9 08/21/2023   HGB 14.1 08/21/2023   PLT 228 08/21/2023   NA 140 08/21/2023   K 4.4 08/21/2023   CL 106 08/21/2023   CO2 28 08/21/2023   GLUCOSE 105 (H) 08/21/2023   BUN 17 08/21/2023   CREATININE 1.11 08/21/2023   BILITOT 0.5 08/21/2023   ALKPHOS 54 12/11/2016   AST 19 08/21/2023   ALT 23 08/21/2023   PROT 6.7 08/21/2023   ALBUMIN 4.7 12/11/2016   CALCIUM 9.0 08/21/2023   GFRAA >89 12/11/2016   QFTBGOLDPLUS NEGATIVE 08/21/2023    Speciality Comments: Last Labs: 03/28/2023  Procedures:  No procedures performed Allergies: Patient has no known allergies.    Assessment / Plan:     Visit Diagnoses: Rheumatoid arthritis involving multiple sites with positive rheumatoid factor (HCC): He has no synovitis on examination today.  He has not had any signs or symptoms of a rheumatoid arthritis flare.  He has clinically been doing well  taking Xeljanz  11 mg XR by mouth daily.  He is tolerating Xeljanz  without any side effects and has not had any gaps in therapy.  He has not been experiencing any morning stiffness, nocturnal pain, or difficulty performing ADLs.  He has been exercising 3 to 4 days/week without difficulty.  He has not had any recent or recurrent infections.  He will remain on Xeljanz  as monotherapy.  He was advised to notify us  if he develops signs or symptoms of a flare.  He will follow-up in the office in 5 months or sooner if needed.  High risk medication use - Xeljanz  XR 11 mg by mouth daily. CBC and CMP updated on 11/27/23.  His next lab work will be due in October and every 3 months   TB gold negative on 08/21/23.  Lipid panel updated on 01/20/24  Discussed the importance of holding xeljanz  if he develops signs or symptoms of an infection and to resume once the infection has completely cleared.  Discussed the FDA blackbox warning for major cardiovascular events and VTE in patients on Jak inhibitors.  Discussed the importance of modifying risk factors.  He has been exercising 3 to 4 days/week which he was encouraged to continue.  Hemoglobin A1c was 5.7% on 01/20/2024.  Total cholesterol was 153, triglycerides 155, LDL 72 on 01/20/2024.  HLA B27 (HLA B27 positive)  Chronic pain of both shoulders: Not currently symptomatic.  Has good range of motion of the shoulder joints with no discomfort or tenderness upon palpation.  Crepitus of both knee joints:  he has good range of motion of both knee joints on examination today.  No warmth or effusion noted.  Mixed hyperlipidemia -Lipid panel obtained on 01/20/2024: Total cholesterol 153, triglycerides 155, HDL 55, LDL 72.  He is taking Crestor 5 mg 1 tablet daily.  Other medical conditions are listed as follows:   History of hypertension: Encouraged the patient to monitor blood pressure closely.  OSA (obstructive sleep apnea)  Neck pain - X-rays show C6-C7 narrowing and  facet joint arthropathy.  He has good range of motion of the cervical spine.  No discomfort or symptoms of radiculopathy at this time.  Orders: No orders of the defined types were placed in this encounter.  No orders of the defined types were placed in this encounter.    Follow-Up Instructions: Return in about 5 months (around 06/23/2024) for Rheumatoid arthritis.   Waddell CHRISTELLA Craze, PA-C  Note - This record has been created using Dragon software.  Chart creation errors have been sought, but may not always  have been located. Such creation errors do not reflect on  the standard of medical care.

## 2024-01-21 LAB — LAB REPORT - SCANNED
A1c: 5.7
EGFR: 81

## 2024-01-22 ENCOUNTER — Ambulatory Visit: Attending: Physician Assistant | Admitting: Physician Assistant

## 2024-01-22 ENCOUNTER — Encounter: Payer: Self-pay | Admitting: Physician Assistant

## 2024-01-22 VITALS — BP 133/77 | HR 54 | Resp 14 | Ht 74.0 in | Wt 248.0 lb

## 2024-01-22 DIAGNOSIS — M238X1 Other internal derangements of right knee: Secondary | ICD-10-CM

## 2024-01-22 DIAGNOSIS — Z1589 Genetic susceptibility to other disease: Secondary | ICD-10-CM

## 2024-01-22 DIAGNOSIS — G8929 Other chronic pain: Secondary | ICD-10-CM

## 2024-01-22 DIAGNOSIS — Z79899 Other long term (current) drug therapy: Secondary | ICD-10-CM

## 2024-01-22 DIAGNOSIS — Z8679 Personal history of other diseases of the circulatory system: Secondary | ICD-10-CM

## 2024-01-22 DIAGNOSIS — M25511 Pain in right shoulder: Secondary | ICD-10-CM | POA: Diagnosis not present

## 2024-01-22 DIAGNOSIS — M542 Cervicalgia: Secondary | ICD-10-CM

## 2024-01-22 DIAGNOSIS — M0579 Rheumatoid arthritis with rheumatoid factor of multiple sites without organ or systems involvement: Secondary | ICD-10-CM | POA: Diagnosis not present

## 2024-01-22 DIAGNOSIS — G4733 Obstructive sleep apnea (adult) (pediatric): Secondary | ICD-10-CM

## 2024-01-22 DIAGNOSIS — M25512 Pain in left shoulder: Secondary | ICD-10-CM

## 2024-01-22 DIAGNOSIS — M238X2 Other internal derangements of left knee: Secondary | ICD-10-CM

## 2024-01-22 DIAGNOSIS — E782 Mixed hyperlipidemia: Secondary | ICD-10-CM

## 2024-01-23 DIAGNOSIS — C44619 Basal cell carcinoma of skin of left upper limb, including shoulder: Secondary | ICD-10-CM | POA: Diagnosis not present

## 2024-02-04 ENCOUNTER — Other Ambulatory Visit: Payer: Self-pay | Admitting: Rheumatology

## 2024-02-04 NOTE — Telephone Encounter (Signed)
 Last Fill: 11/26/2023  Labs: 12/02/2023 glucose 105 01/20/2024 CMP WNL  TB Gold: 08/21/2023 negative    Next Visit: 06/24/2024  Last Visit: 01/22/2024  IK:Myzlfjunpi arthritis involving multiple sites with positive rheumatoid factor   Current Dose per office note on 01/22/2024: Xeljanz  XR 11 mg by mouth daily.   Okay to refill Xeljanz ?

## 2024-02-11 DIAGNOSIS — Z79899 Other long term (current) drug therapy: Secondary | ICD-10-CM | POA: Diagnosis not present

## 2024-02-11 DIAGNOSIS — B351 Tinea unguium: Secondary | ICD-10-CM | POA: Diagnosis not present

## 2024-03-20 DIAGNOSIS — E782 Mixed hyperlipidemia: Secondary | ICD-10-CM | POA: Diagnosis not present

## 2024-03-20 DIAGNOSIS — M069 Rheumatoid arthritis, unspecified: Secondary | ICD-10-CM | POA: Diagnosis not present

## 2024-03-20 DIAGNOSIS — E7849 Other hyperlipidemia: Secondary | ICD-10-CM | POA: Diagnosis not present

## 2024-03-20 DIAGNOSIS — B351 Tinea unguium: Secondary | ICD-10-CM | POA: Diagnosis not present

## 2024-03-20 DIAGNOSIS — Z6831 Body mass index (BMI) 31.0-31.9, adult: Secondary | ICD-10-CM | POA: Diagnosis not present

## 2024-03-20 DIAGNOSIS — I10 Essential (primary) hypertension: Secondary | ICD-10-CM | POA: Diagnosis not present

## 2024-04-22 ENCOUNTER — Other Ambulatory Visit: Payer: Self-pay | Admitting: Physician Assistant

## 2024-04-22 DIAGNOSIS — Z9225 Personal history of immunosupression therapy: Secondary | ICD-10-CM

## 2024-04-22 DIAGNOSIS — Z111 Encounter for screening for respiratory tuberculosis: Secondary | ICD-10-CM

## 2024-04-22 DIAGNOSIS — Z79899 Other long term (current) drug therapy: Secondary | ICD-10-CM

## 2024-04-22 NOTE — Telephone Encounter (Signed)
 Last Fill: 02/04/2024  Labs: 12/02/2023 glucose 105 01/20/2024 CMP WNL  TB Gold: 08/21/2023 negative    Next Visit: 06/24/2024  Last Visit: 01/22/2024  IK:Myzlfjunpi arthritis involving multiple sites with positive rheumatoid factor   Current Dose per office note 01/22/2024: Xeljanz  XR 11 mg by mouth daily.   Patient advised he is due for labs. Patient states he has it scheduled for 05/11/2024  Okay to refill Xeljanz ?

## 2024-05-05 ENCOUNTER — Telehealth: Payer: Self-pay

## 2024-05-05 LAB — LAB REPORT - SCANNED
A1c: 5.8
EGFR: 72
TSH: 2.55 (ref 0.41–5.90)

## 2024-05-05 NOTE — Telephone Encounter (Signed)
 Labs received from: Channing Gaskins  Drawn on: 05/04/2024  Reviewed by: Waddell Craze, PA-C  Labs drawn: CBC, CMP, Lipid Panel, Hemoglobin A1c, Prostate, Vitamin D, TSH, Vitamin B12  Results:  Hemoglobin A1c

## 2024-05-15 ENCOUNTER — Other Ambulatory Visit: Payer: Self-pay | Admitting: Physician Assistant

## 2024-06-10 NOTE — Progress Notes (Unsigned)
 "  Office Visit Note  Patient: Bobby Kramer             Date of Birth: [birth year]           MRN: [id number]             PCP: Leonce Lucie PARAS, PA-C Referring: Leonce Lucie PARAS, PA-C Visit Date: 06/24/2024 Occupation: Data Unavailable  Subjective:  Medication monitoring  History of Present Illness: Bobby Kramer is a 54 y.o. male with history of seropositive rheumatoid arthritis.  Patient remains on xeljanz  11 mg XR by mouth daily.  He is tolerating Xeljanz  without any side effects and has not had any gaps in therapy.  He denies any signs or symptoms of a rheumatoid arthritis flare.  He denies any joint swelling at this time.  Patient states that he has had a recurrence of plantar fasciitis affecting the right foot.  He is trying to wear proper fitting shoes and got over-the-counter insoles for more support especially while wearing boots for work.  Patient states that overall his shoulder pain has improved and is no longer having nocturnal symptoms. He denies any recurrent infections.  Denies any new medical conditions. He remains active exercising on a regular basis.   Activities of Daily Living:  Patient reports morning stiffness for 0 minute.   Patient Denies nocturnal pain.  Difficulty dressing/grooming: Denies Difficulty climbing stairs: Denies Difficulty getting out of chair: Denies Difficulty using hands for taps, buttons, cutlery, and/or writing: Denies  Review of Systems  Constitutional:  Negative for fatigue.  HENT:  Negative for mouth sores and mouth dryness.   Eyes:  Negative for dryness.  Respiratory:  Negative for shortness of breath.   Cardiovascular:  Negative for chest pain and palpitations.  Gastrointestinal:  Negative for blood in stool, constipation and diarrhea.  Endocrine: Negative for increased urination.  Genitourinary:  Negative for involuntary urination.  Musculoskeletal:  Positive for joint pain and joint pain. Negative for gait problem, joint  swelling, myalgias, muscle weakness, morning stiffness, muscle tenderness and myalgias.  Skin:  Negative for color change, rash, hair loss and sensitivity to sunlight.  Allergic/Immunologic: Negative for susceptible to infections.  Neurological:  Negative for dizziness and headaches.  Hematological:  Negative for swollen glands.  Psychiatric/Behavioral:  Negative for depressed mood and sleep disturbance. The patient is not nervous/anxious.     PMFS History:  Patient Active Problem List   Diagnosis Date Noted   Special screening for malignant neoplasms, colon    Pain in right hip 06/20/2016   High risk medication use 05/31/2016   History of hypertension 05/31/2016   History of sleep apnea 05/31/2016   HLA B27 (HLA B27 positive) 05/31/2016   Trochanteric bursitis of both hips 05/31/2016   OSA (obstructive sleep apnea) 01/08/2016   Overweight 01/08/2016   Hyperlipidemia 01/08/2016   Rheumatoid arthritis (HCC) 01/08/2016   Cervicalgia 11/13/2013   Sprain of neck 11/13/2013   HTN (hypertension) 07/28/2013   Mild obesity 07/28/2013    Past Medical History:  Diagnosis Date   Basal cell carcinoma 06/2023   Hypertension    Rheumatoid arthritis (HCC)    Seasonal allergies    Sleep apnea    uses a cpap    Family History  Problem Relation Age of Onset   Ovarian cancer Mother    Cancer Mother        colon    Diabetes Father    Heart disease Father    Healthy Sister  Healthy Daughter    Healthy Daughter    Past Surgical History:  Procedure Laterality Date   BASAL CELL CARCINOMA EXCISION  06/2023   lower back   CARDIAC CATHETERIZATION  05/09/2012   EF 55% Normal Coronary Arteries   COLONOSCOPY N/A 04/04/2018   Procedure: COLONOSCOPY;  Surgeon: Harvey Margo CROME, MD;  Location: [facility];  Service: Endoscopy;  Laterality: N/A;  1:00 - pt knows to arrive at 10:30   COLONOSCOPY N/A 12/13/2023   Procedure: COLONOSCOPY;  Surgeon: Cindie Carlin POUR, DO;  Location: [facility];   Service: Endoscopy;  Laterality: N/A;  11:15 am, asa 2   DOPPLER ECHOCARDIOGRAPHY  09/18/2011   EF>55%   EXCISION MORTON'S NEUROMA  03/06/2012   Procedure: EXCISION MORTON'S NEUROMA;  Surgeon: Norleen Armor, MD;  Location: [facility];  Service: Orthopedics;  Laterality: Right;  EXCISION OF RIGHT THIRD WEB SPACE NEUROMA  HENDER RETRACTOR *ESMARK USED AS TOURNIQUET.  ON AT 9051.  OFF AT 0826.   GXT  09/18/2011   LEFT HEART CATHETERIZATION WITH CORONARY ANGIOGRAM Bilateral 05/26/2012   Procedure: LEFT HEART CATHETERIZATION WITH CORONARY ANGIOGRAM;  Surgeon: Jerel Balding, MD;  Location: [facility];  Service: Cardiovascular;  Laterality: Bilateral;   SYNOVECTOMY  03/06/2012   Procedure: SYNOVECTOMY;  Surgeon: Norleen Armor, MD;  Location: [facility];  Service: Orthopedics;  Laterality: Right;  SYNOVECTOMY OF THIRD AND FOURTH METATARSAL PHALANGEAL JOINT   WISDOM TOOTH EXTRACTION     Social History[1] Social History   Social History Narrative   Not on file     Immunization History  Administered Date(s) Administered   Influenza-Unspecified 02/26/2015, 03/28/2018   Janssen (J&J) SARS-COV-2 Vaccination 11/06/2019   Moderna Sars-Covid-2 Vaccination 04/27/2020     Objective: Vital Signs: BP (!) 142/86   Pulse 64   Temp 98.3 F (36.8 C)   Resp 14   Ht 6' 2 (1.88 m)   Wt 246 lb 12.8 oz (111.9 kg)   BMI 31.69 kg/m    Physical Exam Vitals and nursing note reviewed.  Constitutional:      Appearance: He is well-developed.  HENT:     Head: Normocephalic and atraumatic.  Eyes:     Conjunctiva/sclera: Conjunctivae normal.     Pupils: Pupils are equal, round, and reactive to light.  Cardiovascular:     Rate and Rhythm: Normal rate and regular rhythm.     Heart sounds: Normal heart sounds.  Pulmonary:     Effort: Pulmonary effort is normal.     Breath sounds: Normal breath sounds.  Abdominal:     General: Bowel sounds are normal.     Palpations:  Abdomen is soft.  Musculoskeletal:     Cervical back: Normal range of motion and neck supple.  Skin:    General: Skin is warm and dry.     Capillary Refill: Capillary refill takes less than 2 seconds.  Neurological:     Mental Status: He is alert and oriented to person, place, and time.  Psychiatric:        Behavior: Behavior normal.      Musculoskeletal Exam: C-spine, thoracic spine, lumbar spine have good range of motion.  No midline spinal tenderness.  No SI joint tenderness.  Shoulder joints, elbow joints, wrist joints, MCPs, PIPs, DIPs have good range of motion with no synovitis.  PIP and DIP thickening.  Complete fist formation bilaterally.  Hip joints have good range of motion with no groin pain.  Knee joints have good  range of motion no warmth or effusion.  Mild crepitus with range of motion of both knees.  Ankle joints have good range of motion no tenderness or joint swelling.  Right plantar fasciitis.    CDAI Exam: CDAI Score: -- Patient Global: --; Provider Global: -- Swollen: --; Tender: -- Joint Exam 06/24/2024   No joint exam has been documented for this visit   There is currently no information documented on the homunculus. Go to the Rheumatology activity and complete the homunculus joint exam.  Investigation: No additional findings.  Imaging: No results found.  Recent Labs: Lab Results  Component Value Date   WBC 3.9 08/21/2023   HGB 14.1 08/21/2023   PLT 228 08/21/2023   NA 140 08/21/2023   K 4.4 08/21/2023   CL 106 08/21/2023   CO2 28 08/21/2023   GLUCOSE 105 (H) 08/21/2023   BUN 17 08/21/2023   CREATININE 1.11 08/21/2023   BILITOT 0.5 08/21/2023   ALKPHOS 54 12/11/2016   AST 19 08/21/2023   ALT 23 08/21/2023   PROT 6.7 08/21/2023   ALBUMIN 4.7 12/11/2016   CALCIUM 9.0 08/21/2023   GFRAA >89 12/11/2016   QFTBGOLDPLUS NEGATIVE 08/21/2023    Speciality Comments: Last Labs: 03/28/2023  Procedures:  No procedures performed Allergies: Patient  has no known allergies.   Assessment / Plan:     Visit Diagnoses: Rheumatoid arthritis involving multiple sites with positive rheumatoid factor (HCC): He has no synovitis on examination today.  He has not had any signs or symptoms of a rheumatoid arthritis flare.  He has clinically been doing well taking Xeljanz  11 mg XR by mouth daily without interruption.  He remains active exercising on a regular basis without difficulty.  He has had a recurrence of plantar fasciitis affecting the right foot but has otherwise not had any increased joint pain and has no active inflammation on exam.  No medication changes will be made at this time.  He was advised to notify us  if he develops any new or worsening symptoms.  He will follow-up in the office in 5 months or sooner if needed.  High risk medication use - Xeljanz  XR 11 mg by mouth daily. CBC and CMP updated on 05/04/24.   Lipid panel updated on 05/04/24.  TB gold negative on 08/21/23.  No recurrent infections.  Discussed the importance of holding xeljanz  if he develops signs or symptoms of an infection and to resume once the infection has completely cleared.   Discussed the increased risk for major cardiovascular events while on Xeljanz .  Discussed importance of modifying risk factors and remaining active exercising on a regular basis.  - Plan: QuantiFERON-TB Gold Plus  Screening for tuberculosis - Future order for TB gold placed today.  Plan: QuantiFERON-TB Gold Plus  HLA B27 (HLA B27 positive)  Chronic pain of both shoulders: Improved.  Good ROM with no discomfort or tenderness.  He had bilateral subacromial cortisone injections performed on 10/24/2022.  Crepitus of both knee joints: Mild. Good ROM of both knees with no warmth or effusion.   Plantar fasciitis, right: He has been experiencing recurrence of pain consistent with plantar fasciitis affecting the right foot.  His symptoms recurred in early December 2025 while playing basketball.  He has been  performing home exercises and has been wearing proper fitting shoes as well as using over-the-counter insoles. His symptoms of started to gradually improve over the past few weeks.  Discussed the option of establishing care tried foot and ankle.  He will  notify us  if his symptoms persist or worsen-discussed the option of physical therapy or a cortisone injection in the future if needed.  Other medical conditions are listed as follows:   Mixed hyperlipidemia: Lipid panel WNL on 05/04/24.   History of hypertension: BP was elevated today in the office and was rechecked prior to leaving.   Neck pain - X-rays show C6-C7 narrowing and facet joint arthropathy.  OSA (obstructive sleep apnea)  Trochanteric bursitis of both hips: Not currently symptomatic.  Good ROM of both hip joints.      Orders: Orders Placed This Encounter  Procedures   QuantiFERON-TB Gold Plus   No orders of the defined types were placed in this encounter.     Follow-Up Instructions: Return in about 5 months (around 11/22/2024) for Rheumatoid arthritis.   Waddell CHRISTELLA Craze, PA-C  Note - This record has been created using Dragon software.  Chart creation errors have been sought, but may not always  have been located. Such creation errors do not reflect on  the standard of medical care.     [1]  Social History Tobacco Use   Smoking status: Former    Current packs/day: 0.00    Types: Cigarettes    Quit date: 03/05/2007    Years since quitting: 17.3    Passive exposure: Never   Smokeless tobacco: Never   Tobacco comments:    recreational   Vaping Use   Vaping status: Never Used  Substance Use Topics   Alcohol use: Yes    Comment: occasional   Drug use: Never   "

## 2024-06-24 ENCOUNTER — Encounter: Payer: Self-pay | Admitting: Physician Assistant

## 2024-06-24 ENCOUNTER — Ambulatory Visit: Attending: Physician Assistant | Admitting: Physician Assistant

## 2024-06-24 VITALS — BP 142/86 | HR 64 | Temp 98.3°F | Resp 14 | Ht 74.0 in | Wt 246.8 lb

## 2024-06-24 DIAGNOSIS — Z8679 Personal history of other diseases of the circulatory system: Secondary | ICD-10-CM

## 2024-06-24 DIAGNOSIS — Z79899 Other long term (current) drug therapy: Secondary | ICD-10-CM

## 2024-06-24 DIAGNOSIS — Z111 Encounter for screening for respiratory tuberculosis: Secondary | ICD-10-CM | POA: Diagnosis not present

## 2024-06-24 DIAGNOSIS — M25512 Pain in left shoulder: Secondary | ICD-10-CM

## 2024-06-24 DIAGNOSIS — M238X2 Other internal derangements of left knee: Secondary | ICD-10-CM

## 2024-06-24 DIAGNOSIS — M238X1 Other internal derangements of right knee: Secondary | ICD-10-CM | POA: Diagnosis not present

## 2024-06-24 DIAGNOSIS — M7061 Trochanteric bursitis, right hip: Secondary | ICD-10-CM | POA: Diagnosis not present

## 2024-06-24 DIAGNOSIS — G4733 Obstructive sleep apnea (adult) (pediatric): Secondary | ICD-10-CM

## 2024-06-24 DIAGNOSIS — M7062 Trochanteric bursitis, left hip: Secondary | ICD-10-CM

## 2024-06-24 DIAGNOSIS — M25511 Pain in right shoulder: Secondary | ICD-10-CM | POA: Diagnosis not present

## 2024-06-24 DIAGNOSIS — E782 Mixed hyperlipidemia: Secondary | ICD-10-CM

## 2024-06-24 DIAGNOSIS — M542 Cervicalgia: Secondary | ICD-10-CM

## 2024-06-24 DIAGNOSIS — M722 Plantar fascial fibromatosis: Secondary | ICD-10-CM | POA: Diagnosis not present

## 2024-06-24 DIAGNOSIS — Z1589 Genetic susceptibility to other disease: Secondary | ICD-10-CM

## 2024-06-24 DIAGNOSIS — G8929 Other chronic pain: Secondary | ICD-10-CM

## 2024-06-24 DIAGNOSIS — M0579 Rheumatoid arthritis with rheumatoid factor of multiple sites without organ or systems involvement: Secondary | ICD-10-CM | POA: Diagnosis not present

## 2024-06-24 NOTE — Patient Instructions (Signed)
 Standing Labs We placed an order today for your standing lab work.   Please have your standing labs drawn in March and every 3 months   Please have your labs drawn 2 weeks prior to your appointment so that the provider can discuss your lab results at your appointment, if possible.  Please note that you may see your imaging and lab results in MyChart before we have reviewed them. We will contact you once all results are reviewed. Please allow our office up to 72 hours to thoroughly review all of the results before contacting the office for clarification of your results.  WALK-IN LAB HOURS  Monday through Thursday from 8:00 am - 4:30 pm and Friday from 8:00 am-12:00 pm.  Patients with office visits requiring labs will be seen before walk-in labs.  You may encounter longer than normal wait times. Please allow additional time. Wait times may be shorter on  Monday and Thursday afternoons.  We do not book appointments for walk-in labs. We appreciate your patience and understanding with our staff.   Labs are drawn by [facility]. Please bring your co-pay at the time of your lab draw.  You may receive a bill from [facility] for your lab work.  Please note if you are on Hydroxychloroquine and and an order has been placed for a Hydroxychloroquine level,  you will need to have it drawn 4 hours or more after your last dose.  If you wish to have your labs drawn at another location, please call the office 24 hours in advance so we can fax the orders.  The office is located at [address], [place], [place] [address]   If you have any questions regarding directions or hours of operation,  please call [phone].   As a reminder, please drink plenty of water  prior to coming for your lab work. Thanks!

## 2024-11-19 ENCOUNTER — Ambulatory Visit: Admitting: Physician Assistant
